# Patient Record
Sex: Female | Born: 1970 | Race: White | Hispanic: No | Marital: Married | Smoking: Never smoker
Health system: Southern US, Community
[De-identification: ages and names within clinical notes are randomized; demographics above are authoritative.]

## PROBLEM LIST (undated history)

## (undated) DIAGNOSIS — I10 Essential (primary) hypertension: Secondary | ICD-10-CM

## (undated) DIAGNOSIS — F419 Anxiety disorder, unspecified: Secondary | ICD-10-CM

## (undated) DIAGNOSIS — J13 Pneumonia due to Streptococcus pneumoniae: Secondary | ICD-10-CM

## (undated) DIAGNOSIS — J189 Pneumonia, unspecified organism: Secondary | ICD-10-CM

## (undated) DIAGNOSIS — G8929 Other chronic pain: Secondary | ICD-10-CM

## (undated) DIAGNOSIS — K219 Gastro-esophageal reflux disease without esophagitis: Secondary | ICD-10-CM

## (undated) DIAGNOSIS — D649 Anemia, unspecified: Secondary | ICD-10-CM

## (undated) DIAGNOSIS — M1990 Unspecified osteoarthritis, unspecified site: Secondary | ICD-10-CM

## (undated) DIAGNOSIS — G4700 Insomnia, unspecified: Secondary | ICD-10-CM

## (undated) DIAGNOSIS — E785 Hyperlipidemia, unspecified: Secondary | ICD-10-CM

## (undated) HISTORY — PX: CHEST TUBE INSERTION: SHX231

## (undated) HISTORY — DX: Essential (primary) hypertension: I10

## (undated) HISTORY — PX: GASTRIC BYPASS: SHX52

## (undated) HISTORY — DX: Pneumonia due to Streptococcus pneumoniae: J13

## (undated) HISTORY — PX: OTHER SURGICAL HISTORY: SHX169

## (undated) HISTORY — DX: Hyperlipidemia, unspecified: E78.5

---

## 1998-08-18 DIAGNOSIS — J13 Pneumonia due to Streptococcus pneumoniae: Secondary | ICD-10-CM

## 1998-08-18 HISTORY — DX: Pneumonia due to Streptococcus pneumoniae: J13

## 1998-08-18 HISTORY — PX: OTHER SURGICAL HISTORY: SHX169

## 2009-08-18 HISTORY — PX: OTHER SURGICAL HISTORY: SHX169

## 2009-08-24 LAB — PULMONARY FUNCTION TEST

## 2012-09-26 ENCOUNTER — Emergency Department (INDEPENDENT_AMBULATORY_CARE_PROVIDER_SITE_OTHER)
Admission: EM | Admit: 2012-09-26 | Discharge: 2012-09-26 | Disposition: A | Discharge status: Home / Self Care | Payer: 59 | Source: Home / Self Care | Attending: Family Medicine | Admitting: Family Medicine

## 2012-09-26 ENCOUNTER — Ambulatory Visit (HOSPITAL_BASED_OUTPATIENT_CLINIC_OR_DEPARTMENT_OTHER)
Admit: 2012-09-26 | Discharge: 2012-09-26 | Disposition: A | Discharge status: Home / Self Care | Payer: 59 | Source: Ambulatory Visit | Attending: Family Medicine | Admitting: Family Medicine

## 2012-09-26 DIAGNOSIS — R51 Headache: Secondary | ICD-10-CM | POA: Insufficient documentation

## 2012-09-26 DIAGNOSIS — R110 Nausea: Secondary | ICD-10-CM

## 2012-09-26 HISTORY — DX: Insomnia, unspecified: G47.00

## 2012-09-26 HISTORY — DX: Anxiety disorder, unspecified: F41.9

## 2012-09-26 HISTORY — DX: Gastro-esophageal reflux disease without esophagitis: K21.9

## 2012-09-26 LAB — POCT INFLUENZA A/B
Influenza A, POC: NEGATIVE
Influenza B, POC: NEGATIVE

## 2012-09-26 MED ORDER — DEXAMETHASONE SODIUM PHOSPHATE 10 MG/ML IJ SOLN
10.0000 mg | Freq: Once | INTRAMUSCULAR | Status: AC
  Administered 2012-09-26: 10 mg via INTRAMUSCULAR

## 2012-09-26 MED ORDER — PROMETHAZINE HCL 25 MG PO TABS: 25.0000 mg | ORAL_TABLET | Freq: Four times a day (QID) | ORAL | Status: DC | PRN

## 2012-09-26 MED ORDER — SUMATRIPTAN SUCCINATE 25 MG PO TABS: 25.0000 mg | ORAL_TABLET | ORAL | Status: DC | PRN

## 2012-09-26 MED ORDER — ONDANSETRON HCL 4 MG PO TABS
4.0000 mg | ORAL_TABLET | Freq: Once | ORAL | Status: AC
  Administered 2012-09-26: 4 mg via ORAL

## 2012-09-26 NOTE — ED Notes (Signed)
States she has had a migraine for 3 weeks without relief from pain meds, states she think it may be a sinus infection.

## 2012-09-26 NOTE — ED Provider Notes (Signed)
History     CSN: [id number]  Arrival date & time 09/26/12  1403   First MD Initiated Contact with Patient 09/26/12 1406      Chief Complaint  Patient presents with  . Migraine  . Nausea   HPI Patient presents today with headache and nausea. Symptoms have been present for the past 3 days. Patient states she has a remote history of migraines in the past however since he's had significant weight loss headache has not been present for the past 3 years. Patient states she's had mild frontal/occipital headache as been fairly persistent over the same time frame. Patient states the headache woke her up from sleep this morning and was the worse headache that she ever had before her life at the time. Patient is also has a mild blurry vision with this. As well as nausea no hemiparesis or confusion. No recent infections. No recent strenuous activity. Patient states she's been using Percocets from a recent kidney stone to help with pain as well as by mouth Phenergan. Patient states that these medications have been minimally effective. Patient is worried that this may be coming from a sinus infection. Though, patient denies any sinus pressure, or nasal congestion. Patient does have some mild body aches and chills. Noted positive flu exposure and sun. Patient has not had a flu shot this year.  Past Medical History  Diagnosis Date  . Anxiety   . GERD (gastroesophageal reflux disease)   . Insomnia     Past Surgical History  Procedure Laterality Date  . Cesarean section      x4  . Chest tube insertion    . Gastric bypass      No family history on file.  History  Substance Use Topics  . Smoking status: Not on file  . Smokeless tobacco: Not on file  . Alcohol Use: Not on file    OB History   No data available      Review of Systems  All other systems reviewed and are negative.    Allergies  Levaquin and Penicillins  Home Medications   Current Outpatient Rx  Name  Route  Sig   Dispense  Refill  . ALPRAZolam (XANAX) 1 MG tablet   Oral   Take 1 mg by mouth at bedtime as needed for sleep.         [name] levocetirizine (XYZAL) 5 MG tablet   Oral   Take 5 mg by mouth every evening.         [name] omeprazole (PRILOSEC) 40 MG capsule   Oral   Take 40 mg by mouth daily.         [name] oxyCODONE-acetaminophen (PERCOCET/ROXICET) 5-325 MG per tablet   Oral   Take 1 tablet by mouth every 4 (four) hours as needed for pain.         . traZODone (DESYREL) 100 MG tablet   Oral   Take 100 mg by mouth at bedtime.         [name] venlafaxine (EFFEXOR) 37.5 MG tablet   Oral   Take 37.5 mg by mouth 2 (two) times daily.         . Vitamin D, Ergocalciferol, (DRISDOL) 50000 UNITS CAPS   Oral   Take 50,000 Units by mouth.         . promethazine (PHENERGAN) 25 MG tablet   Oral   Take 1 tablet (25 mg total) by mouth every 6 (six) hours as needed for nausea.   30  tablet   0     BP 139/87  Pulse 73  Temp(Src) 98.4 F (36.9 C) (Oral)  Ht 5\' 5"  (1.651 m)  Wt 180 lb 4 oz (81.761 kg)  BMI 30 kg/m2  SpO2 99%  Physical Exam  Constitutional: She appears well-developed and well-nourished.  HENT:  Head: Normocephalic and atraumatic.  Right Ear: External ear normal.  Left Ear: External ear normal.  +nasal erythema, rhinorrhea bilaterally, + post oropharyngeal erythema    Eyes: Conjunctivae are normal. Pupils are equal, round, and reactive to light.  Mild photophobia on funduscopic exam   Neck: Normal range of motion. Neck supple.    Cardiovascular: Normal rate, regular rhythm and normal heart sounds.   Pulmonary/Chest: Effort normal.  Abdominal: Soft.  Musculoskeletal: Normal range of motion.  Lymphadenopathy:    She has no cervical adenopathy.  Neurological: She is alert. No cranial nerve deficit. Coordination normal.  Skin: Skin is warm.    ED Course  Procedures (including critical care time)  Labs Reviewed - No data to display No results found.   1.  Headache       MDM  We'll clinically treat with Decadron 10 mg IM x1 as well as by mouth zofran. Rx for po phenergan and imitrex given. Discussed withhold trazodone with use of imitrex given overlapping serotonergic effects. effexor is at fairly low dose. Discouraged Percocet used for this pain. Patient does meet some red flags for neuroimaging including this being the worst headache that she's had before her life and headache waking her up from sleep. Will obtain head CT without contrast to further evaluate neuroanatomy. Patient does not have any maxillary tenderness on my exam. Doubt there is any sinusitis overlap with symptoms. Discuss neuro an infectious red flags with patient at length. Follow up with PCP about this issue in next 2-3 days.     The patient and/or caregiver has been counseled thoroughly with regard to treatment plan and/or medications prescribed including dosage, schedule, interactions, rationale for use, and possible side effects and they verbalize understanding. Diagnoses and expected course of recovery discussed and will return if not improved as expected or if the condition worsens. Patient and/or caregiver verbalized understanding.              Doree Albee, MD 09/26/12 1544

## 2012-09-28 ENCOUNTER — Telehealth: Payer: Self-pay | Admitting: *Deleted

## 2012-12-11 ENCOUNTER — Emergency Department (INDEPENDENT_AMBULATORY_CARE_PROVIDER_SITE_OTHER): Payer: 59

## 2012-12-11 ENCOUNTER — Emergency Department (INDEPENDENT_AMBULATORY_CARE_PROVIDER_SITE_OTHER)
Admission: EM | Admit: 2012-12-11 | Discharge: 2012-12-11 | Disposition: A | Discharge status: Home / Self Care | Payer: 59 | Source: Home / Self Care | Attending: Family Medicine | Admitting: Family Medicine

## 2012-12-11 ENCOUNTER — Encounter: Payer: Self-pay | Admitting: Emergency Medicine

## 2012-12-11 DIAGNOSIS — R071 Chest pain on breathing: Secondary | ICD-10-CM

## 2012-12-11 DIAGNOSIS — R05 Cough: Secondary | ICD-10-CM

## 2012-12-11 DIAGNOSIS — J189 Pneumonia, unspecified organism: Secondary | ICD-10-CM

## 2012-12-11 DIAGNOSIS — D649 Anemia, unspecified: Secondary | ICD-10-CM

## 2012-12-11 DIAGNOSIS — G4700 Insomnia, unspecified: Secondary | ICD-10-CM

## 2012-12-11 DIAGNOSIS — K219 Gastro-esophageal reflux disease without esophagitis: Secondary | ICD-10-CM

## 2012-12-11 DIAGNOSIS — R918 Other nonspecific abnormal finding of lung field: Secondary | ICD-10-CM

## 2012-12-11 DIAGNOSIS — R059 Cough, unspecified: Secondary | ICD-10-CM

## 2012-12-11 HISTORY — DX: Pneumonia, unspecified organism: J18.9

## 2012-12-11 LAB — POCT CBC W AUTO DIFF (K'VILLE URGENT CARE)

## 2012-12-11 MED ORDER — ESZOPICLONE 1 MG PO TABS: 1.0000 mg | ORAL_TABLET | Freq: Every day | ORAL | Status: DC

## 2012-12-11 MED ORDER — ALPRAZOLAM 0.5 MG PO TABS: ORAL_TABLET | ORAL | Status: DC

## 2012-12-11 MED ORDER — CLARITHROMYCIN 500 MG PO TABS: 250.0000 mg | ORAL_TABLET | Freq: Two times a day (BID) | ORAL | Status: DC

## 2012-12-11 NOTE — ED Provider Notes (Signed)
History     CSN: [id number]  Arrival date & time 12/11/12  1314   First MD Initiated Contact with Patient 12/11/12 1334      Chief Complaint  Patient presents with  . Cough      HPI Comments: Patient has a history of GERD and aspiration pneumonia in the past.  She has run out of her Pepcid AC.  Two days ago she was awakened with an episode of reflux and feels that she aspirated again.  She has had a recurrent cough and right anterior pleuritic pain.  She has had wheezing.  She has been fatigued and yesterday felt hot.  She has had pneumonia three times in the past.  She has had pneumococcal vaccine.  She has a history of gastric bypass.  The history is provided by the patient.    Past Medical History  Diagnosis Date  . Anxiety   . GERD (gastroesophageal reflux disease)   . Insomnia   . Pneumonia     Past Surgical History  Procedure Laterality Date  . Cesarean section      x4  . Chest tube insertion    . Gastric bypass      No family history on file.  History  Substance Use Topics  . Smoking status: Never Smoker   . Smokeless tobacco: Not on file  . Alcohol Use: Yes    OB History   Grav Para Term Preterm Abortions TAB SAB Ect Mult Living                  Review of Systems No sore throat + cough + right pleuritic pain + wheezing + nasal congestion ? post-nasal drainage No sinus pain/pressure No itchy/red eyes ? right earache No hemoptysis + SOB No fever/chills No nausea No vomiting No abdominal pain No diarrhea No urinary symptoms No skin rashes + fatigue No myalgias + headache    Allergies  Levaquin and Penicillins  Home Medications   Current Outpatient Rx  Name  Route  Sig  Dispense  Refill  . ALPRAZolam (XANAX) 0.5 MG tablet      Take one tab by mouth 2 or 3 times daily as needed for anxiety   20 tablet   0   . ALPRAZolam (XANAX) 1 MG tablet   Oral   Take 1 mg by mouth at bedtime as needed for sleep.         .  clarithromycin (BIAXIN) 500 MG tablet   Oral   Take 0.5 tablets (250 mg total) by mouth 2 (two) times daily.   20 tablet   0   . eszopiclone (LUNESTA) 1 MG TABS   Oral   Take 1 tablet (1 mg total) by mouth at bedtime. Take immediately before bedtime   14 tablet   0   . levocetirizine (XYZAL) 5 MG tablet   Oral   Take 5 mg by mouth every evening.         [name] omeprazole (PRILOSEC) 40 MG capsule   Oral   Take 40 mg by mouth daily.         [name] oxyCODONE-acetaminophen (PERCOCET/ROXICET) 5-325 MG per tablet   Oral   Take 1 tablet by mouth every 4 (four) hours as needed for pain.         . promethazine (PHENERGAN) 25 MG tablet   Oral   Take 1 tablet (25 mg total) by mouth every 6 (six) hours as needed for nausea.   30 tablet  0   . SUMAtriptan (IMITREX) 25 MG tablet   Oral   Take 1 tablet (25 mg total) by mouth every 2 (two) hours as needed for migraine.   10 tablet   0   . Vitamin D, Ergocalciferol, (DRISDOL) 50000 UNITS CAPS   Oral   Take 50,000 Units by mouth.           BP 120/77  Pulse 81  Temp(Src) 98.1 F (36.7 C) (Oral)  Ht 5\' 6"  (1.676 m)  Wt 177 lb (80.287 kg)  BMI 28.58 kg/m2  SpO2 99%  LMP 12/10/2012  Physical Exam Nursing notes and Vital Signs reviewed. Appearance:  Patient appears stated age, and in no acute distress Eyes:  Pupils are equal, round, and reactive to light and accomodation.  Extraocular movement is intact.  Conjunctivae are not inflamed  Ears:  Canals normal.  Tympanic membranes normal.  Nose:  Mildly congested turbinates.  No sinus tenderness.     Pharynx:  Normal Neck:  Supple.  Slightly tender shotty right posterior node  Lungs:  Clear to auscultation.  Breath sounds are equal.  Heart:  Regular rate and rhythm without murmurs, rubs, or gallops.  Abdomen:  Nontender without masses or hepatosplenomegaly.  Bowel sounds are present.  No CVA or flank tenderness.  Extremities:  No edema.  No calf tenderness Skin:  No rash present.    ED Course  Procedures  none  Labs Reviewed  POCT CBC W AUTO DIFF ([facility])  WBC 11.1; LY 17.7; MO 4.6; GR 77.7; Hgb 11.3; Platelets 160    Dg Chest 2 View  12/11/2012  *RADIOLOGY REPORT*  Clinical Data: Cough for the past 2 days.  Pleuritic right-sided chest pain.  CHEST - 2 VIEW  Comparison: No priors.  Findings: Right upper lobe airspace consolidation concerning for pneumonia.  No definite pleural effusions.  Pulmonary vasculature and the cardiomediastinal silhouette are within normal.  IMPRESSION: 1.  Right upper lobe airspace consolidation concerning for pneumonia.  Repeat chest radiograph in 2-3 weeks after appropriate trial of antimicrobial therapy is highly recommended to ensure resolution of this finding (i.e., to exclude the possibility of a central obstructing neoplasm).   Original Report Authenticated By: Trudie Reed, M.D.      1. Right upper lobe pneumonia   2. Anemia   3. GERD (gastroesophageal reflux disease)   4. Insomnia       MDM  Begin Biaxin. Will substitute Lunesta for trazodone (because of clarithromycin interaction).  Will also decrease dose of Xanax for now. Take plain Mucinex (guaifenesin) twice daily for cough and congestion.  Increase fluid intake, rest. Stop all antihistamines for now, and other non-prescription cough/cold preparations. Stop trazodone Resume omeprazole for GERD If symptoms become significantly worse during the night or over the weekend, proceed to the local emergency room.  Followup with Family Doctor in one week.  Will need repeat chest X-ray in about two weeks        Lattie Haw, MD 12/11/12 1705

## 2012-12-11 NOTE — ED Notes (Signed)
Patient states had acid reflux episode 2 days ago and feels she may have aspirated some stomach acid (which has happened in past) and now has painful cough.

## 2012-12-12 ENCOUNTER — Telehealth: Payer: Self-pay | Admitting: Emergency Medicine

## 2012-12-17 ENCOUNTER — Ambulatory Visit (INDEPENDENT_AMBULATORY_CARE_PROVIDER_SITE_OTHER): Payer: 59 | Admitting: Family Medicine

## 2012-12-17 ENCOUNTER — Encounter: Payer: Self-pay | Admitting: Family Medicine

## 2012-12-17 ENCOUNTER — Ambulatory Visit (INDEPENDENT_AMBULATORY_CARE_PROVIDER_SITE_OTHER): Payer: 59

## 2012-12-17 VITALS — BP 148/96 | HR 69 | Temp 98.3°F | Wt 177.0 lb

## 2012-12-17 DIAGNOSIS — F341 Dysthymic disorder: Secondary | ICD-10-CM

## 2012-12-17 DIAGNOSIS — J690 Pneumonitis due to inhalation of food and vomit: Secondary | ICD-10-CM

## 2012-12-17 DIAGNOSIS — R5383 Other fatigue: Secondary | ICD-10-CM

## 2012-12-17 DIAGNOSIS — F329 Major depressive disorder, single episode, unspecified: Secondary | ICD-10-CM

## 2012-12-17 DIAGNOSIS — F419 Anxiety disorder, unspecified: Secondary | ICD-10-CM

## 2012-12-17 DIAGNOSIS — J189 Pneumonia, unspecified organism: Secondary | ICD-10-CM

## 2012-12-17 MED ORDER — VENLAFAXINE HCL ER 37.5 MG PO CP24: 37.5000 mg | ORAL_CAPSULE | Freq: Every day | ORAL | Status: DC
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
## 2012-12-18 LAB — VITAMIN B12: Vitamin B-12: 405 pg/mL (ref 211–911)

## 2012-12-24 ENCOUNTER — Encounter: Payer: Self-pay | Admitting: Family Medicine

## 2012-12-24 DIAGNOSIS — E559 Vitamin D deficiency, unspecified: Secondary | ICD-10-CM | POA: Insufficient documentation

## 2012-12-24 DIAGNOSIS — J189 Pneumonia, unspecified organism: Secondary | ICD-10-CM | POA: Insufficient documentation

## 2012-12-24 DIAGNOSIS — K219 Gastro-esophageal reflux disease without esophagitis: Secondary | ICD-10-CM

## 2012-12-24 DIAGNOSIS — E119 Type 2 diabetes mellitus without complications: Secondary | ICD-10-CM | POA: Insufficient documentation

## 2012-12-24 DIAGNOSIS — G43909 Migraine, unspecified, not intractable, without status migrainosus: Secondary | ICD-10-CM | POA: Insufficient documentation

## 2012-12-24 DIAGNOSIS — Z9884 Bariatric surgery status: Secondary | ICD-10-CM | POA: Insufficient documentation

## 2012-12-24 DIAGNOSIS — J329 Chronic sinusitis, unspecified: Secondary | ICD-10-CM | POA: Insufficient documentation

## 2012-12-24 DIAGNOSIS — G4700 Insomnia, unspecified: Secondary | ICD-10-CM

## 2012-12-24 DIAGNOSIS — E785 Hyperlipidemia, unspecified: Secondary | ICD-10-CM | POA: Insufficient documentation

## 2012-12-24 DIAGNOSIS — I10 Essential (primary) hypertension: Secondary | ICD-10-CM | POA: Insufficient documentation

## 2012-12-24 DIAGNOSIS — F32A Depression, unspecified: Secondary | ICD-10-CM | POA: Insufficient documentation

## 2012-12-24 DIAGNOSIS — E11319 Type 2 diabetes mellitus with unspecified diabetic retinopathy without macular edema: Secondary | ICD-10-CM | POA: Insufficient documentation

## 2012-12-24 DIAGNOSIS — F419 Anxiety disorder, unspecified: Secondary | ICD-10-CM | POA: Insufficient documentation

## 2012-12-24 DIAGNOSIS — E349 Endocrine disorder, unspecified: Secondary | ICD-10-CM

## 2012-12-24 DIAGNOSIS — F329 Major depressive disorder, single episode, unspecified: Secondary | ICD-10-CM | POA: Insufficient documentation

## 2013-01-18 ENCOUNTER — Telehealth: Payer: Self-pay | Admitting: Family Medicine

## 2013-01-18 ENCOUNTER — Ambulatory Visit (INDEPENDENT_AMBULATORY_CARE_PROVIDER_SITE_OTHER): Payer: 59 | Admitting: Family Medicine

## 2013-01-18 VITALS — BP 146/89 | HR 68 | Wt 180.0 lb

## 2013-01-18 DIAGNOSIS — I10 Essential (primary) hypertension: Secondary | ICD-10-CM

## 2013-01-18 MED ORDER — HYDROCHLOROTHIAZIDE 25 MG PO TABS: 25.0000 mg | ORAL_TABLET | Freq: Every day | ORAL | Status: DC

## 2013-01-18 NOTE — Progress Notes (Signed)
CC: Kimberly Wade is a 42 y.o. female is here for Hypertension   Subjective: HPI:  Patient presents with concerns of hypertension. She has been on an angiotensin receptor blocker prior to gastric bypass. She has been off of this for over a year. She has noticed at home blood pressures ranging systolics 140-170. She was experiencing tingling in all fingers and toes with waves of nausea and a headache memorial day weekend which resolved with increased hydration. This returned late last week she was seen at the emergency room in [place] and reportedly had normal labs, EKG, chest x-ray but did have a systolic blood pressure 160.  She's unsure about sodium intake but believes it's relatively low.  Nothing particularly makes blood pressure better or worse nor above symptoms. She currently denies nausea, headaches, motor sensory disturbances, chest pain, shortness of breath, orthopnea. She does note that she feels there is some mild edema in all of her fingers and toes but has been present for about a week. Present all hours of the day and symmetric.  Review Of Systems Outlined In HPI  Past Medical History  Diagnosis Date  . Anxiety   . GERD (gastroesophageal reflux disease)   . Insomnia   . Pneumonia   . Hypertension   . Diabetes   . Hyperlipidemia      Family History  Problem Relation Age of Onset  . Colon cancer      grandmother  . Heart attack      grandfather  . Diabetes Mother     father  . Hyperlipidemia    . Hypertension Mother      History  Substance Use Topics  . Smoking status: Never Smoker   . Smokeless tobacco: Not on file  . Alcohol Use: Yes     Objective: Filed Vitals:   01/18/13 1311  BP: 146/89  Pulse: 68    General: Alert and Oriented, No Acute Distress HEENT: Pupils equal, round, reactive to light. Conjunctivae clear.  Moist mucous membranes Neuro: Cranial nerves II through XII grossly intact Lungs: Clear to auscultation bilaterally, no  wheezing/ronchi/rales.  Comfortable work of breathing. Good air movement. Cardiac: Regular rate and rhythm. Normal S1/S2.  No murmurs, rubs, nor gallops.   Extremities: No peripheral edema.  Strong peripheral pulses.  Mental Status: No depression, anxiety, nor agitation. Skin: Warm and dry.  Assessment & Plan: Kimberly Wade was seen today for hypertension.  Diagnoses and associated orders for this visit:  Essential hypertension, benign - hydrochlorothiazide (HYDRODIURIL) 25 MG tablet; Take 1 tablet (25 mg total) by mouth daily.    Essential hypertension: Chronic uncontrolled condition with deterioration. We discussed options including ACE inhibitor, ARB, or hydrochlorothiazide, she would prefer hydrochlorothiazide. Discussed diet and exercise interventions. Recheck blood pressure 2-4 weeks with follow or sooner symptoms do not improve. Awaiting records from ER, will obtain metabolic panel only if not obtained at that visit.  Return in about 4 weeks (around 02/15/2013).

## 2013-01-18 NOTE — Telephone Encounter (Signed)
Daughter of patient will call the day before requesting fasting lipid panel with complete metabolic panel to determine if there is truly a need to remain on Pravachol, no history of stroke or PVD nor coronary artery disease. Will consider Pravachol holiday following this blood draw.

## 2013-02-15 ENCOUNTER — Telehealth: Payer: Self-pay | Admitting: *Deleted

## 2013-02-15 ENCOUNTER — Other Ambulatory Visit: Payer: Self-pay | Admitting: *Deleted

## 2013-02-15 DIAGNOSIS — G4700 Insomnia, unspecified: Secondary | ICD-10-CM

## 2013-02-15 MED ORDER — TRAZODONE HCL 100 MG PO TABS: ORAL_TABLET | ORAL | Status: DC

## 2013-02-15 NOTE — Telephone Encounter (Signed)
Pt calls today asking for a refill of her trazadone.  [facility] on [address] please.

## 2013-03-28 ENCOUNTER — Telehealth: Payer: Self-pay | Admitting: Family Medicine

## 2013-03-28 ENCOUNTER — Ambulatory Visit (INDEPENDENT_AMBULATORY_CARE_PROVIDER_SITE_OTHER): Payer: 59

## 2013-03-28 ENCOUNTER — Other Ambulatory Visit: Payer: Self-pay | Admitting: Family Medicine

## 2013-03-28 ENCOUNTER — Ambulatory Visit (INDEPENDENT_AMBULATORY_CARE_PROVIDER_SITE_OTHER): Payer: 59 | Admitting: Family Medicine

## 2013-03-28 ENCOUNTER — Encounter: Payer: Self-pay | Admitting: Family Medicine

## 2013-03-28 VITALS — BP 143/93 | HR 89 | Temp 98.2°F | Wt 174.0 lb

## 2013-03-28 DIAGNOSIS — J189 Pneumonia, unspecified organism: Secondary | ICD-10-CM

## 2013-03-28 DIAGNOSIS — I10 Essential (primary) hypertension: Secondary | ICD-10-CM

## 2013-03-28 DIAGNOSIS — G4700 Insomnia, unspecified: Secondary | ICD-10-CM

## 2013-03-28 DIAGNOSIS — J690 Pneumonitis due to inhalation of food and vomit: Secondary | ICD-10-CM

## 2013-03-28 DIAGNOSIS — F411 Generalized anxiety disorder: Secondary | ICD-10-CM

## 2013-03-28 DIAGNOSIS — R918 Other nonspecific abnormal finding of lung field: Secondary | ICD-10-CM

## 2013-03-28 DIAGNOSIS — R0789 Other chest pain: Secondary | ICD-10-CM

## 2013-03-28 DIAGNOSIS — R079 Chest pain, unspecified: Secondary | ICD-10-CM

## 2013-03-28 DIAGNOSIS — R071 Chest pain on breathing: Secondary | ICD-10-CM

## 2013-03-28 MED ORDER — TRAZODONE HCL 100 MG PO TABS: ORAL_TABLET | ORAL | Status: DC

## 2013-03-28 MED ORDER — ALPRAZOLAM 1 MG PO TABS: 1.0000 mg | ORAL_TABLET | Freq: Three times a day (TID) | ORAL | Status: DC | PRN

## 2013-03-28 MED ORDER — CLINDAMYCIN HCL 300 MG PO CAPS: 300.0000 mg | ORAL_CAPSULE | Freq: Three times a day (TID) | ORAL | Status: DC

## 2013-03-28 MED ORDER — HYDROCHLOROTHIAZIDE 25 MG PO TABS: 25.0000 mg | ORAL_TABLET | Freq: Every day | ORAL | Status: DC

## 2013-03-28 MED ORDER — OXYCODONE-ACETAMINOPHEN 10-325 MG PO TABS: 1.0000 | ORAL_TABLET | Freq: Four times a day (QID) | ORAL | Status: DC | PRN

## 2013-03-28 NOTE — Telephone Encounter (Signed)
Sue Lush,  Will you please let Mrs. Kimberly Wade know that the radiologist who looked at her chest xray has recommended a ct scan of the chest to confirm that the shadow I was showing her is a pneumonia and not a tumor, I think this is a good idea since I'm surprised she wasn't coughing or having fevers.  I've placed an order for this, but I'd still like her to continue with the antibiotic prescribed today.

## 2013-03-28 NOTE — Progress Notes (Signed)
CC: Kimberly Wade is a 42 y.o. female is here for No chief complaint on file.   Subjective: HPI:  Patient complains of right-sided chest pain that has been present on a daily basis the last week it is worsening on a daily basis. It is present all hours of the day described as a sharp stabbing sensation in the right middle chest that radiates to the right shoulder blade it is present only with inspiration nothing else makes worse. It is improved with shallow breathing or lying on her right side. Is not related to dietary habits or movements other than above. It is barely improved with Tylenol no other interventions as of yet. She still has her gallbladder. She denies nausea, vomiting, diarrhea, abdominal pain, constipation, fevers, chills, coughing, wheezing, shortness of breath, breast pain nor skin changes on the right chest.    Review Of Systems Outlined In HPI  Past Medical History  Diagnosis Date  . Anxiety   . GERD (gastroesophageal reflux disease)   . Insomnia   . Pneumonia   . Hypertension   . Diabetes   . Hyperlipidemia      Family History  Problem Relation Age of Onset  . Colon cancer      grandmother  . Heart attack      grandfather  . Diabetes Mother     father  . Hyperlipidemia    . Hypertension Mother      History  Substance Use Topics  . Smoking status: Never Smoker   . Smokeless tobacco: Not on file  . Alcohol Use: Yes     Objective: Filed Vitals:   03/28/13 1152  BP: 143/93  Pulse: 89  Temp: 98.2 F (36.8 C)    General: Alert and Oriented, No Acute Distress HEENT: Pupils equal, round, reactive to light. Conjunctivae clear.  Moist mucous membranes pharynx unremarkable Lungs: Clear to auscultation bilaterally, no wheezing/ronchi/rales.  Comfortable work of breathing. Good air movement. Cardiac: Regular rate and rhythm. Normal S1/S2.  No murmurs, rubs, nor gallops.   Abdomen: Mild obesity soft nontender MSK: Pain is not reproducible with  compression or palpation of rib cage Extremities: No peripheral edema.  Strong peripheral pulses.  Mental Status: No depression, anxiety, nor agitation. Skin: Warm and dry.  Assessment & Plan: Kimberly Wade was seen today for no specified reason.  Diagnoses and associated orders for this visit:  Right-sided chest wall pain - COMPLETE METABOLIC PANEL WITH GFR  Chest pain - DG Chest 2 View; Future - CBC w/Diff - COMPLETE METABOLIC PANEL WITH GFR  Pneumonia - clindamycin (CLEOCIN) 300 MG capsule; Take 1 capsule (300 mg total) by mouth 3 (three) times daily. - Ambulatory referral to Pulmonology  Aspiration pneumonia  Insomnia - traZODone (DESYREL) 100 MG tablet; 1-2 tabs at bedtime only as needed.  Essential hypertension, benign - hydrochlorothiazide (HYDRODIURIL) 25 MG tablet; Take 1 tablet (25 mg total) by mouth daily.  Generalized anxiety disorder - ALPRAZolam (XANAX) 1 MG tablet; Take 1 tablet (1 mg total) by mouth 3 (three) times daily as needed for sleep.  Other Orders - oxyCODONE-acetaminophen (PERCOCET) 10-325 MG per tablet; Take 1 tablet by mouth every 6 (six) hours as needed for pain.    Chest x-ray was obtained showing a mass in the right upper lung lobe I'm suspicious for aspiration pneumonia therefore we will start clindamycin, would like to check for white count and also screen for hepatic or gallbladder pathology which could also be contributing to her pain. Percocet for pain  I will relay the radiologist recommendation on CT scan with contrast for better characterization of the mass, order has been placed. I would also like to refer her to pulmonology since she has had for aspiration pneumonias in the last year Refills provided as outlined above per her request   Return in about 3 days (around 03/31/2013), or if symptoms worsen or fail to improve.

## 2013-03-28 NOTE — Telephone Encounter (Signed)
Pt.notified

## 2013-03-29 ENCOUNTER — Telehealth: Payer: Self-pay | Admitting: *Deleted

## 2013-03-29 LAB — CBC WITH DIFFERENTIAL/PLATELET
Eosinophils Absolute: 0.1 10*3/uL (ref 0.0–0.7)
Eosinophils Relative: 1 % (ref 0–5)
HCT: 33.2 % — ABNORMAL LOW (ref 36.0–46.0)
Lymphocytes Relative: 17 % (ref 12–46)
Lymphs Abs: 1.5 10*3/uL (ref 0.7–4.0)
MCH: 28.9 pg (ref 26.0–34.0)
MCV: 84.9 fL (ref 78.0–100.0)
Monocytes Absolute: 0.7 10*3/uL (ref 0.1–1.0)
RBC: 3.91 MIL/uL (ref 3.87–5.11)
RDW: 15 % (ref 11.5–15.5)
WBC: 9 10*3/uL (ref 4.0–10.5)

## 2013-03-29 LAB — COMPLETE METABOLIC PANEL WITH GFR
ALT: 11 U/L (ref 0–35)
AST: 11 U/L (ref 0–37)
Albumin: 3.6 g/dL (ref 3.5–5.2)
Alkaline Phosphatase: 75 U/L (ref 39–117)
BUN: 9 mg/dL (ref 6–23)
Calcium: 8.3 mg/dL — ABNORMAL LOW (ref 8.4–10.5)
Chloride: 106 mEq/L (ref 96–112)
Potassium: 3.5 mEq/L (ref 3.5–5.3)

## 2013-03-29 NOTE — Telephone Encounter (Signed)
Authorization obtained through [facility] for CT Chest w/ contrast.  Auth # [id number]  Good until 05/13/2013.  Bonita Quin in Imaging was notified. Barry Dienes, LPN

## 2013-03-30 ENCOUNTER — Encounter: Payer: Self-pay | Admitting: Family Medicine

## 2013-03-30 ENCOUNTER — Encounter (HOSPITAL_BASED_OUTPATIENT_CLINIC_OR_DEPARTMENT_OTHER): Payer: Self-pay

## 2013-03-30 ENCOUNTER — Ambulatory Visit (HOSPITAL_BASED_OUTPATIENT_CLINIC_OR_DEPARTMENT_OTHER)
Admission: RE | Admit: 2013-03-30 | Discharge: 2013-03-30 | Disposition: A | Discharge status: Home / Self Care | Payer: 59 | Source: Ambulatory Visit | Attending: Family Medicine | Admitting: Family Medicine

## 2013-03-30 ENCOUNTER — Other Ambulatory Visit (HOSPITAL_BASED_OUTPATIENT_CLINIC_OR_DEPARTMENT_OTHER): Payer: 59

## 2013-03-30 DIAGNOSIS — R222 Localized swelling, mass and lump, trunk: Secondary | ICD-10-CM | POA: Insufficient documentation

## 2013-03-30 DIAGNOSIS — R918 Other nonspecific abnormal finding of lung field: Secondary | ICD-10-CM

## 2013-03-30 MED ORDER — IOHEXOL 300 MG/ML  SOLN
80.0000 mL | Freq: Once | INTRAMUSCULAR | Status: AC | PRN
  Administered 2013-03-30: 80 mL via INTRAVENOUS

## 2013-03-31 ENCOUNTER — Encounter: Payer: Self-pay | Admitting: *Deleted

## 2013-04-05 ENCOUNTER — Telehealth: Payer: Self-pay | Admitting: Family Medicine

## 2013-04-05 ENCOUNTER — Emergency Department (HOSPITAL_BASED_OUTPATIENT_CLINIC_OR_DEPARTMENT_OTHER): Payer: 59

## 2013-04-05 ENCOUNTER — Encounter (HOSPITAL_BASED_OUTPATIENT_CLINIC_OR_DEPARTMENT_OTHER): Payer: Self-pay | Admitting: *Deleted

## 2013-04-05 ENCOUNTER — Emergency Department (HOSPITAL_BASED_OUTPATIENT_CLINIC_OR_DEPARTMENT_OTHER)
Admission: EM | Admit: 2013-04-05 | Discharge: 2013-04-05 | Disposition: A | Discharge status: Home / Self Care | Payer: 59 | Attending: Emergency Medicine | Admitting: Emergency Medicine

## 2013-04-05 DIAGNOSIS — I10 Essential (primary) hypertension: Secondary | ICD-10-CM | POA: Insufficient documentation

## 2013-04-05 DIAGNOSIS — E119 Type 2 diabetes mellitus without complications: Secondary | ICD-10-CM | POA: Insufficient documentation

## 2013-04-05 DIAGNOSIS — Z9889 Other specified postprocedural states: Secondary | ICD-10-CM | POA: Insufficient documentation

## 2013-04-05 DIAGNOSIS — R0602 Shortness of breath: Secondary | ICD-10-CM | POA: Insufficient documentation

## 2013-04-05 DIAGNOSIS — R059 Cough, unspecified: Secondary | ICD-10-CM | POA: Insufficient documentation

## 2013-04-05 DIAGNOSIS — J189 Pneumonia, unspecified organism: Secondary | ICD-10-CM

## 2013-04-05 DIAGNOSIS — Z880 Allergy status to penicillin: Secondary | ICD-10-CM | POA: Insufficient documentation

## 2013-04-05 DIAGNOSIS — F411 Generalized anxiety disorder: Secondary | ICD-10-CM | POA: Insufficient documentation

## 2013-04-05 DIAGNOSIS — K219 Gastro-esophageal reflux disease without esophagitis: Secondary | ICD-10-CM | POA: Insufficient documentation

## 2013-04-05 DIAGNOSIS — G4700 Insomnia, unspecified: Secondary | ICD-10-CM | POA: Insufficient documentation

## 2013-04-05 DIAGNOSIS — R079 Chest pain, unspecified: Secondary | ICD-10-CM | POA: Insufficient documentation

## 2013-04-05 DIAGNOSIS — R5381 Other malaise: Secondary | ICD-10-CM | POA: Insufficient documentation

## 2013-04-05 DIAGNOSIS — E785 Hyperlipidemia, unspecified: Secondary | ICD-10-CM | POA: Insufficient documentation

## 2013-04-05 DIAGNOSIS — Z9884 Bariatric surgery status: Secondary | ICD-10-CM | POA: Insufficient documentation

## 2013-04-05 DIAGNOSIS — Z79899 Other long term (current) drug therapy: Secondary | ICD-10-CM | POA: Insufficient documentation

## 2013-04-05 DIAGNOSIS — R05 Cough: Secondary | ICD-10-CM | POA: Insufficient documentation

## 2013-04-05 LAB — CBC WITH DIFFERENTIAL/PLATELET
Eosinophils Absolute: 0.2 10*3/uL (ref 0.0–0.7)
Eosinophils Relative: 3 % (ref 0–5)
HCT: 34.1 % — ABNORMAL LOW (ref 36.0–46.0)
Lymphocytes Relative: 23 % (ref 12–46)
Lymphs Abs: 1.3 10*3/uL (ref 0.7–4.0)
MCH: 29.4 pg (ref 26.0–34.0)
MCV: 88.6 fL (ref 78.0–100.0)
Monocytes Absolute: 0.6 10*3/uL (ref 0.1–1.0)
Platelets: 212 10*3/uL (ref 150–400)
RBC: 3.85 MIL/uL — ABNORMAL LOW (ref 3.87–5.11)

## 2013-04-05 LAB — BASIC METABOLIC PANEL
BUN: 13 mg/dL (ref 6–23)
CO2: 24 mEq/L (ref 19–32)
Calcium: 8.5 mg/dL (ref 8.4–10.5)
Creatinine, Ser: 0.6 mg/dL (ref 0.50–1.10)
GFR calc non Af Amer: >90 mL/min (ref >90)
Glucose, Bld: 99 mg/dL (ref 70–99)
Sodium: 137 mEq/L (ref 135–145)

## 2013-04-05 MED ORDER — KETOROLAC TROMETHAMINE 30 MG/ML IJ SOLN
30.0000 mg | Freq: Once | INTRAMUSCULAR | Status: AC
  Administered 2013-04-05: 30 mg via INTRAVENOUS
  Filled 2013-04-05: qty 1

## 2013-04-05 MED ORDER — MORPHINE SULFATE 4 MG/ML IJ SOLN: 4.0000 mg | INTRAMUSCULAR | Status: DC | PRN

## 2013-04-05 MED ORDER — ONDANSETRON HCL 4 MG/2ML IJ SOLN
4.0000 mg | Freq: Once | INTRAMUSCULAR | Status: AC
  Administered 2013-04-05: 4 mg via INTRAVENOUS
  Filled 2013-04-05: qty 2

## 2013-04-05 NOTE — ED Provider Notes (Signed)
CSN: [id number]     Arrival date & time 04/05/13  1125 History     First MD Initiated Contact with Patient 04/05/13 1156     Chief Complaint  Patient presents with  . Nausea    HPI   Patient reports a history of a decortication of a recurrent empyema from a parapneumonic streptococcal pneumonia several years ago. She's had a few episodes of aspiration pneumonia since that time. On Tuesday one week ago she developed some symptoms with back pain and shortness of breath. Primary care physician ordered a chest x-ray. He told her that it looked like she had an aspiration pneumonia again. He requested a CT scan. This was performed last Thursday.  She has done fairly well. She is back to work. She developed some pleuritic right-sided chest pain and episode of chills at work. She feels weak she presents here. Past Medical History  Diagnosis Date  . Anxiety   . GERD (gastroesophageal reflux disease)   . Insomnia   . Pneumonia   . Hypertension   . Diabetes   . Hyperlipidemia    Past Surgical History  Procedure Laterality Date  . Cesarean section      x4  . Chest tube insertion    . Gastric bypass    . Dysplastic mole removed      right ankle  . Chest tubes  2000  . Recoonstruction of right lung    . Mini gastric bypass  2011   Family History  Problem Relation Age of Onset  . Colon cancer      grandmother  . Heart attack      grandfather  . Diabetes Mother     father  . Hyperlipidemia    . Hypertension Mother    History  Substance Use Topics  . Smoking status: Never Smoker   . Smokeless tobacco: Not on file  . Alcohol Use: Yes   OB History   Grav Para Term Preterm Abortions TAB SAB Ect Mult Living                 Review of Systems  Constitutional: Negative for fever, chills, diaphoresis, appetite change and fatigue.  HENT: Negative for sore throat, mouth sores and trouble swallowing.   Eyes: Negative for visual disturbance.  Respiratory: Positive for cough and  shortness of breath. Negative for chest tightness and wheezing.   Cardiovascular: Positive for chest pain.  Gastrointestinal: Negative for nausea, vomiting, abdominal pain, diarrhea and abdominal distention.  Endocrine: Negative for polydipsia, polyphagia and polyuria.  Genitourinary: Negative for dysuria, frequency and hematuria.  Musculoskeletal: Negative for gait problem.  Skin: Negative for color change, pallor and rash.  Neurological: Positive for weakness. Negative for dizziness, syncope, light-headedness and headaches.  Hematological: Does not bruise/bleed easily.  Psychiatric/Behavioral: Negative for behavioral problems and confusion.    Allergies  Levaquin and Penicillins  Home Medications   Current Outpatient Rx  Name  Route  Sig  Dispense  Refill  . ALPRAZolam (XANAX) 1 MG tablet   Oral   Take 1 tablet (1 mg total) by mouth 3 (three) times daily as needed for sleep.   90 tablet   1   . clindamycin (CLEOCIN) 300 MG capsule   Oral   Take 1 capsule (300 mg total) by mouth 3 (three) times daily.   30 capsule   0   . hydrochlorothiazide (HYDRODIURIL) 25 MG tablet   Oral   Take 1 tablet (25 mg total) by mouth daily.  30 tablet   3   . omeprazole (PRILOSEC) 40 MG capsule   Oral   Take 40 mg by mouth daily.         [name] oxyCODONE-acetaminophen (PERCOCET) 10-325 MG per tablet   Oral   Take 1 tablet by mouth every 6 (six) hours as needed for pain.   30 tablet   0   . promethazine (PHENERGAN) 25 MG tablet   Oral   Take 1 tablet (25 mg total) by mouth every 6 (six) hours as needed for nausea.   30 tablet   0   . SUMAtriptan (IMITREX) 25 MG tablet   Oral   Take 1 tablet (25 mg total) by mouth every 2 (two) hours as needed for migraine.   10 tablet   0   . traZODone (DESYREL) 100 MG tablet      1-2 tabs at bedtime only as needed.   60 tablet   5   . Vitamin D, Ergocalciferol, (DRISDOL) 50000 UNITS CAPS   Oral   Take 50,000 Units by mouth every 7 (seven)  days.           BP 133/75  Pulse 64  Temp(Src) 98.4 F (36.9 C) (Oral)  Resp 20  Ht 5\' 5"  (1.651 m)  Wt 172 lb (78.019 kg)  BMI 28.62 kg/m2  LMP 03/14/2013 Physical Exam  Constitutional: She is oriented to person, place, and time. She appears well-developed and well-nourished. No distress.  HENT:  Head: Normocephalic.  Eyes: Conjunctivae are normal. Pupils are equal, round, and reactive to light. No scleral icterus.  Neck: Normal range of motion. Neck supple. No thyromegaly present.  Cardiovascular: Normal rate and regular rhythm.  Exam reveals no gallop and no friction rub.   No murmur heard. Pulmonary/Chest: Effort normal and breath sounds normal. No respiratory distress. She has no wheezes. She has no rales.  No decreased breath sounds. No crackles. She is not tachypneic.  Abdominal: Soft. Bowel sounds are normal. She exhibits no distension. There is no tenderness. There is no rebound.  Musculoskeletal: Normal range of motion.  Neurological: She is alert and oriented to person, place, and time.  Skin: Skin is warm and dry. No rash noted.  Psychiatric: She has a normal mood and affect. Her behavior is normal.    ED Course   DG CHEST 2 VIEW   Final Result:        Procedures (including critical care time)  Labs Reviewed  CBC WITH DIFFERENTIAL - Abnormal; Notable for the following:    RBC 3.85 (*)    Hemoglobin 11.3 (*)    HCT 34.1 (*)    All other components within normal limits  BASIC METABOLIC PANEL   Dg Chest 2 View  04/05/2013   *RADIOLOGY REPORT*  Clinical Data: Pneumonia.  Nausea.  CHEST - 2 VIEW  Comparison: 03/30/2013 and 03/28/2013  Findings: Rounded area of infiltrate in the peripheral right upper lobe has decreased compared with previous studies.  This consistent with improving infectious or inflammatory process.  No other areas of infiltrate are seen.  No evidence of pleural effusion.  The heart size is normal.  No hilar or mediastinal masses identified.   IMPRESSION: Resolving focal airspace disease in the right upper lobe, consistent with improving infectious or inflammatory process. Recommend continued radiographic followup to confirm resolution.   Original Report Authenticated By: Myles Rosenthal, M.D.   1. Recurrent pneumonia     MDM  She is not hypoxemic. Her sodium and electrolytes  white count and hemoglobin all appear acceptable. This is a round appearing infiltrate. CT scan interpreted as without infiltrate. Recommendation regarding followup to resolution made. I agree with this. On her most recent episode in April and May of this year she did have followup x-rays which showed near resolution. I discussed with her pulmonary referral. She states her primary care physician is making arrangements for this. We talked about the possibility of bronchiectasis or a stricture related to her past infections.  With her most recent pneumonia she did have x-rays were followed until near-complete resolution. Again I recommended followup x-rays and followup with primary care and pulmonary until resolution to ensure that this is not mass.  Her diagnosis is right upper lobe round pneumonia recurrent  Claudean Kinds, MD 04/05/13 1300

## 2013-04-05 NOTE — Telephone Encounter (Signed)
Sue Lush, When you get a chance can you see if there is any progress on a date for a pulm appt for Kimberly Wade at [facility], looks like [facility] sent it to them on the 11th.  No word yet.

## 2013-04-05 NOTE — ED Notes (Addendum)
Patient states that she was diagnosed with pneumonia last week and now feels "shaky", and fells like her "insides are burning". Taking clindamycin 300mg . States that she is nauseous as well.

## 2013-04-06 ENCOUNTER — Telehealth: Payer: Self-pay | Admitting: *Deleted

## 2013-04-06 NOTE — Telephone Encounter (Signed)
Scheduled pulm appt for 04/14/2013 at 930 at [facility] location. Pt notified and aware of appt

## 2013-04-06 NOTE — Telephone Encounter (Signed)
Scheduled appt with [facility] for 8/28/14a @  930 am at the [facility] location. Pt notified of appt time and date and given phone num to office

## 2013-04-14 ENCOUNTER — Encounter: Payer: Self-pay | Admitting: *Deleted

## 2013-04-14 ENCOUNTER — Ambulatory Visit (INDEPENDENT_AMBULATORY_CARE_PROVIDER_SITE_OTHER): Payer: 59 | Admitting: Critical Care Medicine

## 2013-04-14 ENCOUNTER — Encounter: Payer: Self-pay | Admitting: Critical Care Medicine

## 2013-04-14 VITALS — BP 138/80 | HR 70 | Temp 99.1°F | Ht 65.0 in | Wt 178.0 lb

## 2013-04-14 DIAGNOSIS — R222 Localized swelling, mass and lump, trunk: Secondary | ICD-10-CM

## 2013-04-14 DIAGNOSIS — J189 Pneumonia, unspecified organism: Secondary | ICD-10-CM

## 2013-04-14 DIAGNOSIS — R918 Other nonspecific abnormal finding of lung field: Secondary | ICD-10-CM

## 2013-04-14 MED ORDER — OMEPRAZOLE 40 MG PO CPDR: 40.0000 mg | DELAYED_RELEASE_CAPSULE | Freq: Two times a day (BID) | ORAL | Status: DC

## 2013-04-14 MED ORDER — LEVOCETIRIZINE DIHYDROCHLORIDE 5 MG PO TABS: 5.0000 mg | ORAL_TABLET | Freq: Every evening | ORAL | Status: DC

## 2013-04-14 NOTE — Assessment & Plan Note (Signed)
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 I am concerned about potential for gastric emptying issues leading to reflux and aspiration. I doubt this represents malignancy. I am concerned about immunodeficiency in this patient. The patient's had repeated episodes of pneumonia and prior history of pneumococcal pneumonia with empyema on the right side. Need to evaluate airway anatomy in more detail.  Plan Increase omeprazole to twice daily before meals 1/2 hour Xyzal was refilled A bronchoscopy will be obtained on Sept 3rd at 8AM, at [facility], arrive at [facility], nothing by mouth after midnight No other medication changes Strict reflux diet Also obtain immunoglobulin G., A., M. levels and fungal assay is A gastroenterology consultation would be beneficial and will speak to primary care physician regarding this

## 2013-04-14 NOTE — Patient Instructions (Addendum)
Increase omeprazole to twice daily before meals 1/2 hour Xyzal was refilled A bronchoscopy will be obtained on Sept 3rd at 8AM, at [facility], arrive at [facility], nothing by mouth after midnight No other medication changes Strict reflux diet You need a GI evaluation, I will speak to Dr Katrine Coho today: immune deficiency panel, fungal panel Return 2 weeks

## 2013-04-14 NOTE — Progress Notes (Signed)
Subjective:    Patient ID: Kimberly Wade, female    DOB: 12-12-1970, 42 y.o.   MRN: [id number]  HPI 42 y.o.F This patient has had recurrent pneumonia the right lung for 1-1/2 years. The patient was first diagnosed with pneumococcal pneumonia in the year 2000.  At that time the patient had empyema on the right and required mechanical ventilation. The patient subsequently needed video-assisted thorascopic surgery with drainage. The patient did well until mid 2013 when the patient developed another bout of pneumonia the right lung. Subsequently to this 6 months later 2 more bouts of pneumonia occurred. The patient states that each episode is accompanied with a sensation of aspiration particularly at night. The patient does have high level reflux and has ongoing heartburn on a daily basis. The patient complains of right-sided chest pain and dry cough. There is no excess mucus production. There is no wheezing. There's no weight loss. There is no fever chills or sweats. This patient is a lifelong never smoker. The patient states that the last episode of pneumonia was accompanied by extreme fatigue and severe sharp pain in the back. The patient had severe dyspnea as well. Patient very hard time taking deep breath. Patient was placed on oral clindamycin with some resolution of symptoms. A CT scan of the chest was obtained and did show evidence of a rounded mass in the right upper lobe posterior segment with pleural base and areas of necrotizing [place]. The patient denies any real wheeze and denies any edema in the lower extremities. The patient does take omeprazole daily but takes this at bedtime after eating dinner This patient had a history gastric bypass but does not follow with gastroenterology an irregular basis at this time   Past Medical History  Diagnosis Date  . Anxiety   . GERD (gastroesophageal reflux disease)   . Insomnia   . Pneumonia   . Hypertension   . Diabetes   . Hyperlipidemia   .  Pneumococcal pneumonia 2000     Family History  Problem Relation Age of Onset  . Colon cancer      grandmother  . Heart attack      grandfather  . Diabetes Father     father  . Hyperlipidemia    . Hypertension Mother   . Asthma Daughter      History   Social History  . Marital Status: Legally Separated    Spouse Name: N/A    Number of Children: N/A  . Years of Education: N/A   Occupational History  . Advertising Accountant    Social History Main Topics  . Smoking status: Never Smoker   . Smokeless tobacco: Never Used  . Alcohol Use: Yes     Comment: 1 glass per month  . Drug Use: No  . Sexual Activity: Not on file   Other Topics Concern  . Not on file   Social History Narrative  . No narrative on file     Allergies  Allergen Reactions  . Levaquin [Levofloxacin In D5w] Hives  . Penicillins      Outpatient Prescriptions Prior to Visit  Medication Sig Dispense Refill  . ALPRAZolam (XANAX) 1 MG tablet Take 1 tablet (1 mg total) by mouth 3 (three) times daily as needed for sleep.  90 tablet  1  . hydrochlorothiazide (HYDRODIURIL) 25 MG tablet Take 1 tablet (25 mg total) by mouth daily.  30 tablet  3  . oxyCODONE-acetaminophen (PERCOCET) 10-325 MG per tablet Take 1 tablet by  mouth every 6 (six) hours as needed for pain.  30 tablet  0  . promethazine (PHENERGAN) 25 MG tablet Take 1 tablet (25 mg total) by mouth every 6 (six) hours as needed for nausea.  30 tablet  0  . SUMAtriptan (IMITREX) 25 MG tablet Take 1 tablet (25 mg total) by mouth every 2 (two) hours as needed for migraine.  10 tablet  0  . traZODone (DESYREL) 100 MG tablet 1-2 tabs at bedtime only as needed.  60 tablet  5  . Vitamin D, Ergocalciferol, (DRISDOL) 50000 UNITS CAPS Take 50,000 Units by mouth every 7 (seven) days.       [name] omeprazole (PRILOSEC) 40 MG capsule Take 40 mg by mouth daily.      . clindamycin (CLEOCIN) 300 MG capsule Take 1 capsule (300 mg total) by mouth 3 (three) times daily.  30  capsule  0   No facility-administered medications prior to visit.      Review of Systems  Constitutional: Positive for activity change and fatigue. Negative for fever, chills, diaphoresis, appetite change and unexpected weight change.  HENT: Positive for congestion, sore throat, sneezing, postnasal drip and sinus pressure. Negative for hearing loss, ear pain, nosebleeds, facial swelling, rhinorrhea, mouth sores, trouble swallowing, neck pain, neck stiffness, dental problem, voice change, tinnitus and ear discharge.   Eyes: Positive for discharge and itching. Negative for photophobia and visual disturbance.  Respiratory: Positive for cough and shortness of breath. Negative for apnea, choking, chest tightness, wheezing and stridor.   Cardiovascular: Positive for chest pain and leg swelling. Negative for palpitations.  Gastrointestinal: Positive for nausea. Negative for vomiting, abdominal pain, constipation, blood in stool and abdominal distention.  Genitourinary: Negative for dysuria, urgency, frequency, hematuria, flank pain, decreased urine volume and difficulty urinating.  Musculoskeletal: Positive for back pain. Negative for myalgias, joint swelling, arthralgias and gait problem.  Skin: Negative for color change, pallor and rash.  Neurological: Positive for headaches. Negative for dizziness, tremors, seizures, syncope, speech difficulty, weakness, light-headedness and numbness.  Hematological: Negative for adenopathy. Does not bruise/bleed easily.  Psychiatric/Behavioral: Positive for sleep disturbance. Negative for confusion and agitation. The patient is not nervous/anxious.        Objective:   Physical Exam  Filed Vitals:   04/14/13 0918  BP: 138/80  Pulse: 70  Temp: 99.1 F (37.3 C)  TempSrc: Oral  Height: 5\' 5"  (1.651 m)  Weight: 178 lb (80.74 kg)  SpO2: 99%    Gen: Pleasant, well-nourished, in no distress,  normal affect  ENT: No lesions,  mouth clear,  oropharynx  clear, no postnasal drip  Neck: No JVD, no TMG, no carotid bruits  Lungs: No use of accessory muscles, no dullness to percussion, clear without rales or rhonchi  Cardiovascular: RRR, heart sounds normal, no murmur or gallops, no peripheral edema  Abdomen: soft and NT, no HSM,  BS normal  Musculoskeletal: No deformities, no cyanosis or clubbing  Neuro: alert, non focal  Skin: Warm, no lesions or rashes   CT scan of the chest from 03/28/2013 shows there is a mass in the right upper lobe posterior segment and measures 4 x 3 x 2.9 cm in size. The margins are ill-defined. There is reticular and hazy groundglass opacification. There is areas of low attenuation that may represent necrosis. There are no other lung mass is seen. There is no significant lymphadenopathy or pleural fluid seen.  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 I am concerned about potential for gastric emptying issues leading to reflux and aspiration. I doubt this represents malignancy. I am concerned about immunodeficiency in this patient. The patient's had repeated episodes of pneumonia and prior history of pneumococcal pneumonia with empyema on the right side. Need to evaluate airway anatomy in more detail.  Plan Increase omeprazole to twice daily before meals 1/2 hour Xyzal was refilled A bronchoscopy will be obtained on Sept 3rd at 8AM, at [facility], arrive at [facility], nothing by mouth after midnight No other medication changes Strict reflux diet Also obtain immunoglobulin G., A., M. levels and fungal assay is A gastroenterology consultation would be beneficial and will speak to primary care physician regarding this   Updated  Medication List Outpatient Encounter Prescriptions as of 04/14/2013  Medication Sig Dispense Refill  . ALPRAZolam (XANAX) 1 MG tablet Take 1 tablet (1 mg total) by mouth 3 (three) times daily as needed for sleep.  90 tablet  1  . BIOTIN PO Take 1 capsule by mouth daily.      . hydrochlorothiazide (HYDRODIURIL) 25 MG tablet Take 1 tablet (25 mg total) by mouth daily.  30 tablet  3  . Multiple Vitamin (MULTIVITAMIN) tablet Take 1 tablet by mouth 3 (three) times daily.      [name] omeprazole (PRILOSEC) 40 MG capsule Take 1 capsule (40 mg total) by mouth 2 (two) times daily before a meal.  60 capsule  6  . oxyCODONE-acetaminophen (PERCOCET) 10-325 MG per tablet Take 1 tablet by mouth every 6 (six) hours as needed for pain.  30 tablet  0  . promethazine (PHENERGAN) 25 MG tablet Take 1 tablet (25 mg total) by mouth every 6 (six) hours as needed for nausea.  30 tablet  0  . SUMAtriptan (IMITREX) 25 MG tablet Take 1 tablet (25 mg total) by mouth every 2 (two) hours as needed for migraine.  10 tablet  0  . traZODone (DESYREL) 100 MG tablet 1-2 tabs at bedtime only as needed.  60 tablet  5  . Vitamin D, Ergocalciferol, (DRISDOL) 50000 UNITS CAPS Take 50,000 Units by mouth every 7 (seven) days.       . [DISCONTINUED] omeprazole (PRILOSEC) 40 MG capsule Take 40 mg by mouth daily.      [name] levocetirizine (XYZAL) 5 MG tablet Take 1 tablet (5 mg total) by mouth every evening.  30 tablet  6  . [DISCONTINUED] clindamycin (CLEOCIN) 300 MG capsule Take 1 capsule (300 mg total) by mouth 3 (three) times daily.  30 capsule  0   No facility-administered encounter medications on file as of 04/14/2013.

## 2013-04-19 ENCOUNTER — Encounter (HOSPITAL_COMMUNITY): Payer: Self-pay

## 2013-04-19 ENCOUNTER — Telehealth: Payer: Self-pay | Admitting: Critical Care Medicine

## 2013-04-19 LAB — FUNGAL ANTIBODIES PANEL, ID-BLOOD
Aspergillus Niger Antibodies: NEGATIVE
Aspergillus fumigatus: NEGATIVE
Blastomyces Abs, Qn, DID: NEGATIVE
Coccidioides Antibody ID: NEGATIVE
Histoplasma Antibody, ID: NEGATIVE

## 2013-04-20 ENCOUNTER — Encounter (HOSPITAL_COMMUNITY): Payer: Self-pay | Admitting: Respiratory Therapy

## 2013-04-20 ENCOUNTER — Ambulatory Visit (HOSPITAL_COMMUNITY)
Admission: RE | Admit: 2013-04-20 | Discharge: 2013-04-20 | Disposition: A | Discharge status: Home / Self Care | Payer: 59 | Source: Ambulatory Visit | Attending: Critical Care Medicine | Admitting: Critical Care Medicine

## 2013-04-20 ENCOUNTER — Ambulatory Visit (HOSPITAL_COMMUNITY): Payer: 59

## 2013-04-20 ENCOUNTER — Encounter (HOSPITAL_COMMUNITY)
Admission: RE | Disposition: A | Discharge status: Home / Self Care | Payer: 59 | Source: Ambulatory Visit | Attending: Critical Care Medicine

## 2013-04-20 DIAGNOSIS — R079 Chest pain, unspecified: Secondary | ICD-10-CM | POA: Insufficient documentation

## 2013-04-20 DIAGNOSIS — R059 Cough, unspecified: Secondary | ICD-10-CM | POA: Insufficient documentation

## 2013-04-20 DIAGNOSIS — K219 Gastro-esophageal reflux disease without esophagitis: Secondary | ICD-10-CM | POA: Insufficient documentation

## 2013-04-20 DIAGNOSIS — I10 Essential (primary) hypertension: Secondary | ICD-10-CM | POA: Insufficient documentation

## 2013-04-20 DIAGNOSIS — R05 Cough: Secondary | ICD-10-CM | POA: Insufficient documentation

## 2013-04-20 DIAGNOSIS — Z800 Family history of malignant neoplasm of digestive organs: Secondary | ICD-10-CM | POA: Insufficient documentation

## 2013-04-20 DIAGNOSIS — Z8701 Personal history of pneumonia (recurrent): Secondary | ICD-10-CM | POA: Insufficient documentation

## 2013-04-20 DIAGNOSIS — R918 Other nonspecific abnormal finding of lung field: Secondary | ICD-10-CM

## 2013-04-20 DIAGNOSIS — E119 Type 2 diabetes mellitus without complications: Secondary | ICD-10-CM | POA: Insufficient documentation

## 2013-04-20 DIAGNOSIS — R222 Localized swelling, mass and lump, trunk: Secondary | ICD-10-CM | POA: Insufficient documentation

## 2013-04-20 DIAGNOSIS — Z9884 Bariatric surgery status: Secondary | ICD-10-CM | POA: Insufficient documentation

## 2013-04-20 DIAGNOSIS — R12 Heartburn: Secondary | ICD-10-CM | POA: Insufficient documentation

## 2013-04-20 HISTORY — PX: VIDEO BRONCHOSCOPY: SHX5072

## 2013-04-20 SURGERY — BRONCHOSCOPY, WITH FLUOROSCOPY
Anesthesia: Moderate Sedation | Laterality: Bilateral

## 2013-04-20 MED ORDER — LIDOCAINE HCL 2 % EX GEL
Freq: Once | CUTANEOUS | Status: DC
  Filled 2013-04-20: qty 5

## 2013-04-20 MED ORDER — MIDAZOLAM BOLUS VIA INFUSION: 2.0000 mg | INTRAVENOUS | Status: DC | PRN

## 2013-04-20 MED ORDER — MIDAZOLAM HCL 2 MG/2ML IJ SOLN: 2.0000 mg | INTRAMUSCULAR | Status: DC | PRN

## 2013-04-20 MED ORDER — PHENYLEPHRINE HCL 0.25 % NA SOLN
NASAL | Status: DC | PRN
  Administered 2013-04-20: 2 via NASAL

## 2013-04-20 MED ORDER — LIDOCAINE HCL (PF) 1 % IJ SOLN
INTRAMUSCULAR | Status: DC | PRN
  Administered 2013-04-20: 6 mL

## 2013-04-20 MED ORDER — FENTANYL CITRATE 0.05 MG/ML IJ SOLN
INTRAMUSCULAR | Status: DC | PRN
  Administered 2013-04-20: 50 ug via INTRAVENOUS
  Administered 2013-04-20: 25 ug via INTRAVENOUS

## 2013-04-20 MED ORDER — PHENYLEPHRINE HCL 0.25 % NA SOLN
1.0000 | Freq: Four times a day (QID) | NASAL | Status: DC | PRN
  Filled 2013-04-20: qty 15

## 2013-04-20 MED ORDER — BUTAMBEN-TETRACAINE-BENZOCAINE 2-2-14 % EX AERO: 1.0000 | INHALATION_SPRAY | Freq: Once | CUTANEOUS | Status: DC

## 2013-04-20 MED ORDER — LIDOCAINE HCL 2 % EX GEL
CUTANEOUS | Status: DC | PRN
  Administered 2013-04-20: 1

## 2013-04-20 MED ORDER — SODIUM CHLORIDE 0.9 % IV SOLN
INTRAVENOUS | Status: DC
  Administered 2013-04-20: 08:00:00 via INTRAVENOUS

## 2013-04-20 MED ORDER — FENTANYL CITRATE 0.05 MG/ML IJ SOLN: 25.0000 ug | INTRAMUSCULAR | Status: DC | PRN

## 2013-04-20 MED ORDER — MIDAZOLAM HCL 10 MG/2ML IJ SOLN
INTRAMUSCULAR | Status: DC | PRN
  Administered 2013-04-20: 1 mg via INTRAVENOUS
  Administered 2013-04-20: 2 mg via INTRAVENOUS

## 2013-04-20 NOTE — Interval H&P Note (Signed)
The pt has no new symptoms and no interval change in H and P. The pt is ready for planned OR/FOB. Luisa Hart WrightMD

## 2013-04-20 NOTE — Telephone Encounter (Signed)
Notified melanie no precert required Tobe Sos

## 2013-04-20 NOTE — Op Note (Signed)
Bronchoscopy Procedure Note  Date of Operation: 04/20/2013  Pre-op Diagnosis: Lung mass  Post-op Diagnosis: resolved, pneumonia  Surgeon: Shan Levans  Anesthesia: Monitored Local Anesthesia with Sedation Versed 3mg  iv ; fentanyl iv  Operation: Flexible fiberoptic bronchoscopy, diagnostic   Findings: Normal exam  Specimen: Bronch washing  Estimated Blood Loss: none  Complications: none  Indications and History: The patient is a 42 y.o. female with hx of recurrent PNA.  The risks, benefits, complications, treatment options and expected outcomes were discussed with the patient.  The possibilities of reaction to medication, pulmonary aspiration, perforation of a viscus, bleeding, failure to diagnose a condition and creating a complication requiring transfusion or operation were discussed with the patient who freely signed the consent.    Description of Procedure: The patient was re-examined in the bronchoscopy suite and the site of surgery properly noted/marked.  The patient was identified as Kimberly Wade and the procedure verified as Flexible Fiberoptic Bronchoscopy.  A Time Out was held and the above information confirmed.   After the induction of topical nasopharyngeal anesthesia, the patient was positioned  and the bronchoscope was passed through the L nares. The vocal cords were visualized and  1% buffered lidocaine 5 ml was topically placed onto the cords. The cords were normal. The scope was then passed into the trachea.  1% buffered lidocaine 5 ml was used topically on the carina.  Careful inspection of the tracheal lumen was accomplished. The scope was sequentially passed into the left main and then left upper and lower bronchi and segmental bronchi.     The scope was then withdrawn and advanced into the right main bronchus and then into the RUL, RML, and RLL bronchi and segmental bronchi.   Bronchial washing RUL only  was done and there was one  specimen.    Endobronchial findings: Normal exam Trachea: Normal mucosa Carina: Normal mucosa Right main bronchus: Normal mucosa Right upper lobe bronchus: Normal mucosa Right middle lobe bronchus: Normal mucosa Right lower lobe bronchus: Normal mucosa Left main bronchus: Normal mucosa Left upper lobe bronchus: Normal mucosa Left lower lobe bronchus: Normal mucosa  The Patient was taken to the Endoscopy Recovery area in satisfactory condition.  Attestation: I performed the procedure.  Luisa Hart WrightMD

## 2013-04-20 NOTE — Discharge Instructions (Signed)
Flexible Bronchoscopy Care After These instructions give you information on caring for yourself after your procedure. Your doctor may also give you specific instructions. Call your doctor if you have any problems or questions after your procedure. HOME CARE  Do not eat or drink anything for 2 hours after your test.  After 2 hours have passed, eat soft food and drink liquids slowly.  The day after the test, go back to eating as normal.  Continue normal activities.  Keep all doctor visits if tissue samples (biopsies) were taken. Finding out the results of your test Ask when your test results will be ready. Make sure you get your test results. GET HELP RIGHT AWAY IF:  You get lightheaded.  You get short of breath.  You feel like you are going to pass out (faint).  You have chest pain.  You cough up blood. MAKE SURE YOU:  Understand these instructions.  Will watch your condition.  Will get help right away if you are not doing well or get worse. Document Released: 06/01/2009 Document Revised: 10/27/2011 Document Reviewed: 06/01/2009 [facility] Patient Information 2014 [place], [place].  Nothing to eat or drink until   10:30 am Today      04/20/2013

## 2013-04-20 NOTE — H&P (View-Only) (Signed)
Subjective:    Patient ID: Kimberly Wade, female    DOB: 03/06/1971, 42 y.o.   MRN: [id number]  HPI 42 y.o.F This patient has had recurrent pneumonia the right lung for 1-1/2 years. The patient was first diagnosed with pneumococcal pneumonia in the year 2000.  At that time the patient had empyema on the right and required mechanical ventilation. The patient subsequently needed video-assisted thorascopic surgery with drainage. The patient did well until mid 2013 when the patient developed another bout of pneumonia the right lung. Subsequently to this 6 months later 2 more bouts of pneumonia occurred. The patient states that each episode is accompanied with a sensation of aspiration particularly at night. The patient does have high level reflux and has ongoing heartburn on a daily basis. The patient complains of right-sided chest pain and dry cough. There is no excess mucus production. There is no wheezing. There's no weight loss. There is no fever chills or sweats. This patient is a lifelong never smoker. The patient states that the last episode of pneumonia was accompanied by extreme fatigue and severe sharp pain in the back. The patient had severe dyspnea as well. Patient very hard time taking deep breath. Patient was placed on oral clindamycin with some resolution of symptoms. A CT scan of the chest was obtained and did show evidence of a rounded mass in the right upper lobe posterior segment with pleural base and areas of necrotizing [place]. The patient denies any real wheeze and denies any edema in the lower extremities. The patient does take omeprazole daily but takes this at bedtime after eating dinner This patient had a history gastric bypass but does not follow with gastroenterology an irregular basis at this time   Past Medical History  Diagnosis Date  . Anxiety   . GERD (gastroesophageal reflux disease)   . Insomnia   . Pneumonia   . Hypertension   . Diabetes   . Hyperlipidemia   .  Pneumococcal pneumonia 2000     Family History  Problem Relation Age of Onset  . Colon cancer      grandmother  . Heart attack      grandfather  . Diabetes Father     father  . Hyperlipidemia    . Hypertension Mother   . Asthma Daughter      History   Social History  . Marital Status: Legally Separated    Spouse Name: N/A    Number of Children: N/A  . Years of Education: N/A   Occupational History  . Advertising Accountant    Social History Main Topics  . Smoking status: Never Smoker   . Smokeless tobacco: Never Used  . Alcohol Use: Yes     Comment: 1 glass per month  . Drug Use: No  . Sexual Activity: Not on file   Other Topics Concern  . Not on file   Social History Narrative  . No narrative on file     Allergies  Allergen Reactions  . Levaquin [Levofloxacin In D5w] Hives  . Penicillins      Outpatient Prescriptions Prior to Visit  Medication Sig Dispense Refill  . ALPRAZolam (XANAX) 1 MG tablet Take 1 tablet (1 mg total) by mouth 3 (three) times daily as needed for sleep.  90 tablet  1  . hydrochlorothiazide (HYDRODIURIL) 25 MG tablet Take 1 tablet (25 mg total) by mouth daily.  30 tablet  3  . oxyCODONE-acetaminophen (PERCOCET) 10-325 MG per tablet Take 1 tablet by   mouth every 6 (six) hours as needed for pain.  30 tablet  0  . promethazine (PHENERGAN) 25 MG tablet Take 1 tablet (25 mg total) by mouth every 6 (six) hours as needed for nausea.  30 tablet  0  . SUMAtriptan (IMITREX) 25 MG tablet Take 1 tablet (25 mg total) by mouth every 2 (two) hours as needed for migraine.  10 tablet  0  . traZODone (DESYREL) 100 MG tablet 1-2 tabs at bedtime only as needed.  60 tablet  5  . Vitamin D, Ergocalciferol, (DRISDOL) 50000 UNITS CAPS Take 50,000 Units by mouth every 7 (seven) days.       . omeprazole (PRILOSEC) 40 MG capsule Take 40 mg by mouth daily.      . clindamycin (CLEOCIN) 300 MG capsule Take 1 capsule (300 mg total) by mouth 3 (three) times daily.  30  capsule  0   No facility-administered medications prior to visit.      Review of Systems  Constitutional: Positive for activity change and fatigue. Negative for fever, chills, diaphoresis, appetite change and unexpected weight change.  HENT: Positive for congestion, sore throat, sneezing, postnasal drip and sinus pressure. Negative for hearing loss, ear pain, nosebleeds, facial swelling, rhinorrhea, mouth sores, trouble swallowing, neck pain, neck stiffness, dental problem, voice change, tinnitus and ear discharge.   Eyes: Positive for discharge and itching. Negative for photophobia and visual disturbance.  Respiratory: Positive for cough and shortness of breath. Negative for apnea, choking, chest tightness, wheezing and stridor.   Cardiovascular: Positive for chest pain and leg swelling. Negative for palpitations.  Gastrointestinal: Positive for nausea. Negative for vomiting, abdominal pain, constipation, blood in stool and abdominal distention.  Genitourinary: Negative for dysuria, urgency, frequency, hematuria, flank pain, decreased urine volume and difficulty urinating.  Musculoskeletal: Positive for back pain. Negative for myalgias, joint swelling, arthralgias and gait problem.  Skin: Negative for color change, pallor and rash.  Neurological: Positive for headaches. Negative for dizziness, tremors, seizures, syncope, speech difficulty, weakness, light-headedness and numbness.  Hematological: Negative for adenopathy. Does not bruise/bleed easily.  Psychiatric/Behavioral: Positive for sleep disturbance. Negative for confusion and agitation. The patient is not nervous/anxious.        Objective:   Physical Exam  Filed Vitals:   04/14/13 0918  BP: 138/80  Pulse: 70  Temp: 99.1 F (37.3 C)  TempSrc: Oral  Height: 5' 5" (1.651 m)  Weight: 178 lb (80.74 kg)  SpO2: 99%    Gen: Pleasant, well-nourished, in no distress,  normal affect  ENT: No lesions,  mouth clear,  oropharynx  clear, no postnasal drip  Neck: No JVD, no TMG, no carotid bruits  Lungs: No use of accessory muscles, no dullness to percussion, clear without rales or rhonchi  Cardiovascular: RRR, heart sounds normal, no murmur or gallops, no peripheral edema  Abdomen: soft and NT, no HSM,  BS normal  Musculoskeletal: No deformities, no cyanosis or clubbing  Neuro: alert, non focal  Skin: Warm, no lesions or rashes   CT scan of the chest from 03/28/2013 shows there is a mass in the right upper lobe posterior segment and measures 4 x 3 x 2.9 cm in size. The margins are ill-defined. There is reticular and hazy groundglass opacification. There is areas of low attenuation that may represent necrosis. There are no other lung mass is seen. There is no significant lymphadenopathy or pleural fluid seen.  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 I am concerned about potential for gastric emptying issues leading to reflux and aspiration. I doubt this represents malignancy. I am concerned about immunodeficiency in this patient. The patient's had repeated episodes of pneumonia and prior history of pneumococcal pneumonia with empyema on the right side. Need to evaluate airway anatomy in more detail.  Plan Increase omeprazole to twice daily before meals 1/2 hour Xyzal was refilled A bronchoscopy will be obtained on Sept 3rd at 8AM, at [facility], arrive at 7AM, nothing by mouth after midnight No other medication changes Strict reflux diet Also obtain immunoglobulin G., A., M. levels and fungal assay is A gastroenterology consultation would be beneficial and will speak to primary care physician regarding this   Updated  Medication List Outpatient Encounter Prescriptions as of 04/14/2013  Medication Sig Dispense Refill  . ALPRAZolam (XANAX) 1 MG tablet Take 1 tablet (1 mg total) by mouth 3 (three) times daily as needed for sleep.  90 tablet  1  . BIOTIN PO Take 1 capsule by mouth daily.      . hydrochlorothiazide (HYDRODIURIL) 25 MG tablet Take 1 tablet (25 mg total) by mouth daily.  30 tablet  3  . Multiple Vitamin (MULTIVITAMIN) tablet Take 1 tablet by mouth 3 (three) times daily.      . omeprazole (PRILOSEC) 40 MG capsule Take 1 capsule (40 mg total) by mouth 2 (two) times daily before a meal.  60 capsule  6  . oxyCODONE-acetaminophen (PERCOCET) 10-325 MG per tablet Take 1 tablet by mouth every 6 (six) hours as needed for pain.  30 tablet  0  . promethazine (PHENERGAN) 25 MG tablet Take 1 tablet (25 mg total) by mouth every 6 (six) hours as needed for nausea.  30 tablet  0  . SUMAtriptan (IMITREX) 25 MG tablet Take 1 tablet (25 mg total) by mouth every 2 (two) hours as needed for migraine.  10 tablet  0  . traZODone (DESYREL) 100 MG tablet 1-2 tabs at bedtime only as needed.  60 tablet  5  . Vitamin D, Ergocalciferol, (DRISDOL) 50000 UNITS CAPS Take 50,000 Units by mouth every 7 (seven) days.       . [DISCONTINUED] omeprazole (PRILOSEC) 40 MG capsule Take 40 mg by mouth daily.      . levocetirizine (XYZAL) 5 MG tablet Take 1 tablet (5 mg total) by mouth every evening.  30 tablet  6  . [DISCONTINUED] clindamycin (CLEOCIN) 300 MG capsule Take 1 capsule (300 mg total) by mouth 3 (three) times daily.  30 capsule  0   No facility-administered encounter medications on file as of 04/14/2013.      

## 2013-04-20 NOTE — Progress Notes (Signed)
Video Bronchoscopy done  Intervention bronchial washing done  Procedure tolerated well  I was present for the above procedure and agree.  Shan Levans

## 2013-04-21 ENCOUNTER — Encounter (HOSPITAL_COMMUNITY): Payer: Self-pay | Admitting: Critical Care Medicine

## 2013-04-21 LAB — HYPERSENSITIVITY PNUEMONITIS PROFILE

## 2013-04-22 LAB — CULTURE, RESPIRATORY W GRAM STAIN

## 2013-04-25 ENCOUNTER — Encounter: Payer: Self-pay | Admitting: *Deleted

## 2013-04-25 ENCOUNTER — Encounter: Payer: Self-pay | Admitting: Critical Care Medicine

## 2013-04-25 ENCOUNTER — Ambulatory Visit (HOSPITAL_BASED_OUTPATIENT_CLINIC_OR_DEPARTMENT_OTHER)
Admission: RE | Admit: 2013-04-25 | Discharge: 2013-04-25 | Disposition: A | Discharge status: Home / Self Care | Payer: 59 | Source: Ambulatory Visit | Attending: Critical Care Medicine | Admitting: Critical Care Medicine

## 2013-04-25 ENCOUNTER — Telehealth: Payer: Self-pay | Admitting: Family Medicine

## 2013-04-25 ENCOUNTER — Ambulatory Visit (INDEPENDENT_AMBULATORY_CARE_PROVIDER_SITE_OTHER): Payer: 59 | Admitting: Critical Care Medicine

## 2013-04-25 ENCOUNTER — Encounter: Payer: Self-pay | Admitting: Family Medicine

## 2013-04-25 VITALS — BP 130/84 | HR 76 | Temp 98.1°F | Ht 65.0 in | Wt 180.0 lb

## 2013-04-25 DIAGNOSIS — J690 Pneumonitis due to inhalation of food and vomit: Secondary | ICD-10-CM

## 2013-04-25 DIAGNOSIS — Z23 Encounter for immunization: Secondary | ICD-10-CM

## 2013-04-25 DIAGNOSIS — R222 Localized swelling, mass and lump, trunk: Secondary | ICD-10-CM

## 2013-04-25 DIAGNOSIS — K219 Gastro-esophageal reflux disease without esophagitis: Secondary | ICD-10-CM

## 2013-04-25 DIAGNOSIS — J189 Pneumonia, unspecified organism: Secondary | ICD-10-CM

## 2013-04-25 DIAGNOSIS — R918 Other nonspecific abnormal finding of lung field: Secondary | ICD-10-CM

## 2013-04-25 MED ORDER — OXYCODONE-ACETAMINOPHEN 10-325 MG PO TABS: 1.0000 | ORAL_TABLET | Freq: Four times a day (QID) | ORAL | Status: DC | PRN

## 2013-04-25 MED ORDER — PROMETHAZINE HCL 25 MG PO TABS: 25.0000 mg | ORAL_TABLET | Freq: Four times a day (QID) | ORAL | Status: DC | PRN

## 2013-04-25 NOTE — Patient Instructions (Addendum)
Flu vaccine was given Stay on omeprazole Stay on reflux diet A chest xray will be given Refills on phenegren/percocet given I recommend a gastroenterology referral, communicated to your PCP Return 2 months

## 2013-04-25 NOTE — Progress Notes (Signed)
Subjective:    Patient ID: Kimberly Wade, female    DOB: 1971/04/19, 42 y.o.   MRN: [id number]  HPI  42 y.o.F This patient has had recurrent pneumonia the right lung for 1-1/2 years. The patient was first diagnosed with pneumococcal pneumonia in the year 2000.  At that time the patient had empyema on the right and required mechanical ventilation. The patient subsequently needed video-assisted thorascopic surgery with drainage. The patient did well until mid 2013 when the patient developed another bout of pneumonia the right lung. Subsequently to this 6 months later 2 more bouts of pneumonia occurred. The patient states that each episode is accompanied with a sensation of aspiration particularly at night. The patient does have high level reflux and has ongoing heartburn on a daily basis. The patient complains of right-sided chest pain and dry cough. There is no excess mucus production. There is no wheezing. There's no weight loss. There is no fever chills or sweats. This patient is a lifelong never smoker. The patient states that the last episode of pneumonia was accompanied by extreme fatigue and severe sharp pain in the back. The patient had severe dyspnea as well. Patient very hard time taking deep breath. Patient was placed on oral clindamycin with some resolution of symptoms. A CT scan of the chest was obtained and did show evidence of a rounded mass in the right upper lobe posterior segment with pleural base and areas of necrotizing [place]. The patient denies any real wheeze and denies any edema in the lower extremities. The patient does take omeprazole daily but takes this at bedtime after eating dinner This patient had a history gastric bypass but does not follow with gastroenterology an irregular basis at this time   04/25/2013 Chief Complaint  Patient presents with  . 2 wk follow up    would like to discuss bronch results.  Breathing is unchanged.  Chest tightness today.  No SOB,  wheezing, or cough.  The patient returns in followup and is improved. There is no cough or chest discomfort. There is less wheezing and less mucus production. Bronchoscopy was performed and showed no endobronchial lesion and no purulence seen in the right upper lobe. Chest x-ray has shown gradual improvement in right upper lobe infiltrate.  The patient has reflux symptoms but is improved on omeprazole.  Past Medical History  Diagnosis Date  . Anxiety   . GERD (gastroesophageal reflux disease)   . Insomnia   . Pneumonia   . Hypertension   . Diabetes   . Hyperlipidemia   . Pneumococcal pneumonia 2000     Family History  Problem Relation Age of Onset  . Colon cancer      grandmother  . Heart attack      grandfather  . Diabetes Father     father  . Hyperlipidemia    . Hypertension Mother   . Asthma Daughter      History   Social History  . Marital Status: Legally Separated    Spouse Name: N/A    Number of Children: N/A  . Years of Education: N/A   Occupational History  . Advertising Accountant    Social History Main Topics  . Smoking status: Never Smoker   . Smokeless tobacco: Never Used  . Alcohol Use: Yes     Comment: 1 glass per month  . Drug Use: No  . Sexual Activity: Not on file   Other Topics Concern  . Not on file   Social History  Narrative  . No narrative on file     Allergies  Allergen Reactions  . Levaquin [Levofloxacin In D5w] Hives  . Penicillins      Outpatient Prescriptions Prior to Visit  Medication Sig Dispense Refill  . ALPRAZolam (XANAX) 1 MG tablet Take 1 tablet (1 mg total) by mouth 3 (three) times daily as needed for sleep.  90 tablet  1  . BIOTIN PO Take 1 capsule by mouth daily.      . hydrochlorothiazide (HYDRODIURIL) 25 MG tablet Take 1 tablet (25 mg total) by mouth daily.  30 tablet  3  . levocetirizine (XYZAL) 5 MG tablet Take 1 tablet (5 mg total) by mouth every evening.  30 tablet  6  . Multiple Vitamin (MULTIVITAMIN)  tablet Take 1 tablet by mouth 3 (three) times daily.      [name] omeprazole (PRILOSEC) 40 MG capsule Take 1 capsule (40 mg total) by mouth 2 (two) times daily before a meal.  60 capsule  6  . SUMAtriptan (IMITREX) 25 MG tablet Take 1 tablet (25 mg total) by mouth every 2 (two) hours as needed for migraine.  10 tablet  0  . traZODone (DESYREL) 100 MG tablet 1-2 tabs at bedtime only as needed.  60 tablet  5  . Vitamin D, Ergocalciferol, (DRISDOL) 50000 UNITS CAPS Take 50,000 Units by mouth every 7 (seven) days.       [name] oxyCODONE-acetaminophen (PERCOCET) 10-325 MG per tablet Take 1 tablet by mouth every 6 (six) hours as needed for pain.  30 tablet  0  . promethazine (PHENERGAN) 25 MG tablet Take 1 tablet (25 mg total) by mouth every 6 (six) hours as needed for nausea.  30 tablet  0   No facility-administered medications prior to visit.      Review of Systems  Constitutional: Positive for activity change and fatigue. Negative for fever, chills, diaphoresis, appetite change and unexpected weight change.  HENT: Positive for congestion, sore throat, sneezing, postnasal drip and sinus pressure. Negative for hearing loss, ear pain, nosebleeds, facial swelling, rhinorrhea, mouth sores, trouble swallowing, neck pain, neck stiffness, dental problem, voice change, tinnitus and ear discharge.   Eyes: Positive for discharge and itching. Negative for photophobia and visual disturbance.  Respiratory: Positive for cough and shortness of breath. Negative for apnea, choking, chest tightness, wheezing and stridor.   Cardiovascular: Positive for chest pain and leg swelling. Negative for palpitations.  Gastrointestinal: Positive for nausea. Negative for vomiting, abdominal pain, constipation, blood in stool and abdominal distention.  Genitourinary: Negative for dysuria, urgency, frequency, hematuria, flank pain, decreased urine volume and difficulty urinating.  Musculoskeletal: Positive for back pain. Negative for  myalgias, joint swelling, arthralgias and gait problem.  Skin: Negative for color change, pallor and rash.  Neurological: Positive for headaches. Negative for dizziness, tremors, seizures, syncope, speech difficulty, weakness, light-headedness and numbness.  Hematological: Negative for adenopathy. Does not bruise/bleed easily.  Psychiatric/Behavioral: Positive for sleep disturbance. Negative for confusion and agitation. The patient is not nervous/anxious.        Objective:   Physical Exam   Filed Vitals:   04/25/13 1527  BP: 130/84  Pulse: 76  Temp: 98.1 F (36.7 C)  TempSrc: Oral  Height: 5\' 5"  (1.651 m)  Weight: 180 lb (81.647 kg)  SpO2: 99%    Gen: Pleasant, well-nourished, in no distress,  normal affect  ENT: No lesions,  mouth clear,  oropharynx clear, no postnasal drip  Neck: No JVD, no TMG, no carotid bruits  Lungs: No use of accessory muscles, no dullness to percussion, clear without rales or rhonchi  Cardiovascular: RRR, heart sounds normal, no murmur or gallops, no peripheral edema  Abdomen: soft and NT, no HSM,  BS normal  Musculoskeletal: No deformities, no cyanosis or clubbing  Neuro: alert, non focal  Skin: Warm, no lesions or rashes   Dg Chest 2 View  04/25/2013   *RADIOLOGY REPORT*  Clinical Data: Right upper lobe pneumonia, follow up.  CHEST - 2 VIEW  Comparison: 04/05/2013 and CT chest 03/30/2013.  Findings: Trachea is midline.  Heart size normal.  Near complete resolution of right upper lobe pneumonia with linear density in its place.  Blunting of the right costophrenic angle appears chronic. Lungs are otherwise clear.  No pleural fluid.  IMPRESSION: Near complete resolution of right upper lobe pneumonia with probable residual scarring.   Original Report Authenticated By: Leanna Battles, M.D.        Assessment & Plan:   Lung mass Right upper lobe organized pneumonia due to aspiration syndrome. This has now resolved based on the chest x-ray from  04/25/2013. I would suggest a gastroenterology consultation however because of the possibility of recurrent aspiration pneumonia This patient would benefit from a gastric emptying study.  Plan No additional antibiotics indicated Gastroenterology consultation for reflux aspiration regurgitation and gastric emptying would be indicated  GERD (gastroesophageal reflux disease) High level reflux with aspiration syndrome Plan The primary care physician has made a gastroenterology referral and this is appreciated    Updated Medication List Outpatient Encounter Prescriptions as of 04/25/2013  Medication Sig Dispense Refill  . ALPRAZolam (XANAX) 1 MG tablet Take 1 tablet (1 mg total) by mouth 3 (three) times daily as needed for sleep.  90 tablet  1  . BIOTIN PO Take 1 capsule by mouth daily.      . hydrochlorothiazide (HYDRODIURIL) 25 MG tablet Take 1 tablet (25 mg total) by mouth daily.  30 tablet  3  . levocetirizine (XYZAL) 5 MG tablet Take 1 tablet (5 mg total) by mouth every evening.  30 tablet  6  . Multiple Vitamin (MULTIVITAMIN) tablet Take 1 tablet by mouth 3 (three) times daily.      [name] omeprazole (PRILOSEC) 40 MG capsule Take 1 capsule (40 mg total) by mouth 2 (two) times daily before a meal.  60 capsule  6  . oxyCODONE-acetaminophen (PERCOCET) 10-325 MG per tablet Take 1 tablet by mouth every 6 (six) hours as needed for pain.  30 tablet  0  . promethazine (PHENERGAN) 25 MG tablet Take 1 tablet (25 mg total) by mouth every 6 (six) hours as needed for nausea.  30 tablet  0  . SUMAtriptan (IMITREX) 25 MG tablet Take 1 tablet (25 mg total) by mouth every 2 (two) hours as needed for migraine.  10 tablet  0  . traZODone (DESYREL) 100 MG tablet 1-2 tabs at bedtime only as needed.  60 tablet  5  . Vitamin D, Ergocalciferol, (DRISDOL) 50000 UNITS CAPS Take 50,000 Units by mouth every 7 (seven) days.       . [DISCONTINUED] oxyCODONE-acetaminophen (PERCOCET) 10-325 MG per tablet Take 1 tablet by  mouth every 6 (six) hours as needed for pain.  30 tablet  0  . [DISCONTINUED] promethazine (PHENERGAN) 25 MG tablet Take 1 tablet (25 mg total) by mouth every 6 (six) hours as needed for nausea.  30 tablet  0   No facility-administered encounter medications on file as of 04/25/2013.

## 2013-04-25 NOTE — Telephone Encounter (Signed)
Sue Lush, Will you please let mrs. Kimberly Wade know that her pulmonologist Dr. Delford Field has recommended a gastroenterology referral to look into reflux management to help with preventing future aspiration pneumonia episodes.  I've ordered this referral so if she's not heard about scheduling sometime this week please let me know.

## 2013-04-25 NOTE — Telephone Encounter (Signed)
Left message on vm

## 2013-04-26 NOTE — Assessment & Plan Note (Signed)
High level reflux with aspiration syndrome Plan The primary care physician has made a gastroenterology referral and this is appreciated

## 2013-04-26 NOTE — Assessment & Plan Note (Signed)
Right upper lobe organized pneumonia due to aspiration syndrome. This has now resolved based on the chest x-ray from 04/25/2013. I would suggest a gastroenterology consultation however because of the possibility of recurrent aspiration pneumonia This patient would benefit from a gastric emptying study.  Plan No additional antibiotics indicated Gastroenterology consultation for reflux aspiration regurgitation and gastric emptying would be indicated

## 2013-04-29 ENCOUNTER — Ambulatory Visit: Payer: 59 | Admitting: Family Medicine

## 2013-05-02 ENCOUNTER — Encounter (HOSPITAL_COMMUNITY): Admission: RE | Payer: Self-pay | Source: Ambulatory Visit

## 2013-05-02 ENCOUNTER — Ambulatory Visit (HOSPITAL_COMMUNITY): Admission: RE | Admit: 2013-05-02 | Payer: 59 | Source: Ambulatory Visit | Admitting: Critical Care Medicine

## 2013-05-02 ENCOUNTER — Encounter: Payer: Self-pay | Admitting: Critical Care Medicine

## 2013-05-02 ENCOUNTER — Encounter: Payer: Self-pay | Admitting: Family Medicine

## 2013-05-02 DIAGNOSIS — T17800A Unspecified foreign body in other parts of respiratory tract causing asphyxiation, initial encounter: Secondary | ICD-10-CM | POA: Insufficient documentation

## 2013-05-02 SURGERY — BRONCHOSCOPY, WITH FLUOROSCOPY
Anesthesia: Moderate Sedation

## 2013-05-16 LAB — FUNGUS CULTURE W SMEAR

## 2013-05-27 ENCOUNTER — Ambulatory Visit: Payer: 59 | Admitting: Family Medicine

## 2013-05-30 ENCOUNTER — Encounter (HOSPITAL_BASED_OUTPATIENT_CLINIC_OR_DEPARTMENT_OTHER): Payer: Self-pay | Admitting: Emergency Medicine

## 2013-05-30 ENCOUNTER — Ambulatory Visit: Payer: 59 | Admitting: Family Medicine

## 2013-05-30 ENCOUNTER — Emergency Department (HOSPITAL_BASED_OUTPATIENT_CLINIC_OR_DEPARTMENT_OTHER): Payer: 59

## 2013-05-30 ENCOUNTER — Emergency Department (HOSPITAL_BASED_OUTPATIENT_CLINIC_OR_DEPARTMENT_OTHER)
Admission: EM | Admit: 2013-05-30 | Discharge: 2013-05-30 | Disposition: A | Discharge status: Home / Self Care | Payer: 59 | Attending: Emergency Medicine | Admitting: Emergency Medicine

## 2013-05-30 DIAGNOSIS — Z880 Allergy status to penicillin: Secondary | ICD-10-CM | POA: Insufficient documentation

## 2013-05-30 DIAGNOSIS — I10 Essential (primary) hypertension: Secondary | ICD-10-CM | POA: Insufficient documentation

## 2013-05-30 DIAGNOSIS — J189 Pneumonia, unspecified organism: Secondary | ICD-10-CM

## 2013-05-30 DIAGNOSIS — K219 Gastro-esophageal reflux disease without esophagitis: Secondary | ICD-10-CM | POA: Insufficient documentation

## 2013-05-30 DIAGNOSIS — Z79899 Other long term (current) drug therapy: Secondary | ICD-10-CM | POA: Insufficient documentation

## 2013-05-30 DIAGNOSIS — E119 Type 2 diabetes mellitus without complications: Secondary | ICD-10-CM | POA: Insufficient documentation

## 2013-05-30 DIAGNOSIS — M549 Dorsalgia, unspecified: Secondary | ICD-10-CM | POA: Insufficient documentation

## 2013-05-30 DIAGNOSIS — R110 Nausea: Secondary | ICD-10-CM | POA: Insufficient documentation

## 2013-05-30 DIAGNOSIS — F411 Generalized anxiety disorder: Secondary | ICD-10-CM | POA: Insufficient documentation

## 2013-05-30 DIAGNOSIS — N390 Urinary tract infection, site not specified: Secondary | ICD-10-CM | POA: Insufficient documentation

## 2013-05-30 LAB — URINALYSIS, ROUTINE W REFLEX MICROSCOPIC
Bilirubin Urine: NEGATIVE
Glucose, UA: NEGATIVE mg/dL
Ketones, ur: NEGATIVE mg/dL
Nitrite: NEGATIVE
Specific Gravity, Urine: 1.011 (ref 1.005–1.030)
pH: 6.5 (ref 5.0–8.0)

## 2013-05-30 LAB — CBC WITH DIFFERENTIAL/PLATELET
Eosinophils Absolute: 0.2 10*3/uL (ref 0.0–0.7)
HCT: 34.3 % — ABNORMAL LOW (ref 36.0–46.0)
Hemoglobin: 11.5 g/dL — ABNORMAL LOW (ref 12.0–15.0)
Lymphs Abs: 0.8 10*3/uL (ref 0.7–4.0)
MCH: 29.3 pg (ref 26.0–34.0)
MCHC: 33.5 g/dL (ref 30.0–36.0)
Monocytes Absolute: 1.1 10*3/uL — ABNORMAL HIGH (ref 0.1–1.0)
Monocytes Relative: 7 % (ref 3–12)
Neutrophils Relative %: 87 % — ABNORMAL HIGH (ref 43–77)
RBC: 3.92 MIL/uL (ref 3.87–5.11)

## 2013-05-30 LAB — BASIC METABOLIC PANEL
BUN: 10 mg/dL (ref 6–23)
Chloride: 103 mEq/L (ref 96–112)
Creatinine, Ser: 0.5 mg/dL (ref 0.50–1.10)
GFR calc non Af Amer: >90 mL/min (ref >90)
Glucose, Bld: 130 mg/dL — ABNORMAL HIGH (ref 70–99)
Potassium: 3.6 mEq/L (ref 3.5–5.1)

## 2013-05-30 LAB — URINE MICROSCOPIC-ADD ON

## 2013-05-30 MED ORDER — PROMETHAZINE HCL 25 MG PO TABS
25.0000 mg | ORAL_TABLET | Freq: Once | ORAL | Status: AC
  Administered 2013-05-30: 25 mg via ORAL
  Filled 2013-05-30: qty 1

## 2013-05-30 MED ORDER — PROMETHAZINE HCL 25 MG PO TABS: 25.0000 mg | ORAL_TABLET | Freq: Four times a day (QID) | ORAL | Status: DC | PRN

## 2013-05-30 MED ORDER — NITROFURANTOIN MONOHYD MACRO 100 MG PO CAPS: 100.0000 mg | ORAL_CAPSULE | Freq: Two times a day (BID) | ORAL | Status: DC

## 2013-05-30 MED ORDER — CLINDAMYCIN HCL 300 MG PO CAPS: 300.0000 mg | ORAL_CAPSULE | Freq: Three times a day (TID) | ORAL | Status: DC

## 2013-05-30 MED ORDER — OXYCODONE-ACETAMINOPHEN 5-325 MG PO TABS
1.0000 | ORAL_TABLET | Freq: Once | ORAL | Status: AC
  Administered 2013-05-30: 1 via ORAL
  Filled 2013-05-30: qty 1

## 2013-05-30 MED ORDER — OXYCODONE-ACETAMINOPHEN 10-325 MG PO TABS: 1.0000 | ORAL_TABLET | Freq: Four times a day (QID) | ORAL | Status: DC | PRN

## 2013-05-30 MED ORDER — CLINDAMYCIN HCL 150 MG PO CAPS
300.0000 mg | ORAL_CAPSULE | Freq: Once | ORAL | Status: AC
  Administered 2013-05-30: 300 mg via ORAL
  Filled 2013-05-30: qty 2

## 2013-05-30 NOTE — ED Provider Notes (Signed)
CSN: [id number]     Arrival date & time 05/30/13  1337 History   First MD Initiated Contact with Patient 05/30/13 1422     Chief Complaint  Patient presents with  . Shortness of Breath  . Pain   (Consider location/radiation/quality/duration/timing/severity/associated sxs/prior Treatment) Patient is a 42 y.o. female presenting with shortness of breath. The history is provided by the patient. No language interpreter was used.  Shortness of Breath Severity:  Moderate Onset quality:  Sudden Duration:  1 day Timing:  Constant Progression:  Worsening Chronicity:  Recurrent Relieved by:  None tried Worsened by:  Activity and exertion Ineffective treatments:  None tried Associated symptoms: cough   Associated symptoms: no chest pain and no vomiting   Associated symptoms comment:  Nasuea, no vomiting, low back pain Risk factors comment:  Recurrent pneumonia   Past Medical History  Diagnosis Date  . Anxiety   . GERD (gastroesophageal reflux disease)   . Insomnia   . Pneumonia   . Hypertension   . Diabetes   . Hyperlipidemia   . Pneumococcal pneumonia 2000   Past Surgical History  Procedure Laterality Date  . Cesarean section      x4  . Chest tube insertion    . Gastric bypass    . Dysplastic mole removed      right ankle  . Chest tubes  2000  . Recoonstruction of right lung    . Mini gastric bypass  2011  . Video bronchoscopy Bilateral 04/20/2013    Procedure: VIDEO BRONCHOSCOPY WITH FLUORO;  Surgeon: Storm Frisk, MD;  Location: [facility];  Service: Cardiopulmonary;  Laterality: Bilateral;   Family History  Problem Relation Age of Onset  . Colon cancer      grandmother  . Heart attack      grandfather  . Diabetes Father     father  . Hyperlipidemia    . Hypertension Mother   . Asthma Daughter    History  Substance Use Topics  . Smoking status: Never Smoker   . Smokeless tobacco: Never Used  . Alcohol Use: Yes     Comment: 1 glass per month   OB  History   Grav Para Term Preterm Abortions TAB SAB Ect Mult Living                 Review of Systems  Constitutional: Positive for fatigue.  Respiratory: Positive for cough and shortness of breath.   Cardiovascular: Negative for chest pain.  Gastrointestinal: Positive for nausea. Negative for vomiting.  Musculoskeletal: Positive for back pain.  All other systems reviewed and are negative.    Allergies  Levaquin and Penicillins  Home Medications   Current Outpatient Rx  Name  Route  Sig  Dispense  Refill  . ALPRAZolam (XANAX) 1 MG tablet   Oral   Take 1 tablet (1 mg total) by mouth 3 (three) times daily as needed for sleep.   90 tablet   1   . BIOTIN PO   Oral   Take 1 capsule by mouth daily.         . clindamycin (CLEOCIN) 300 MG capsule   Oral   Take 1 capsule (300 mg total) by mouth 3 (three) times daily.   21 capsule   0   . hydrochlorothiazide (HYDRODIURIL) 25 MG tablet   Oral   Take 1 tablet (25 mg total) by mouth daily.   30 tablet   3   . levocetirizine (XYZAL) 5 MG tablet  Oral   Take 1 tablet (5 mg total) by mouth every evening.   30 tablet   6   . Multiple Vitamin (MULTIVITAMIN) tablet   Oral   Take 1 tablet by mouth 3 (three) times daily.         . nitrofurantoin, macrocrystal-monohydrate, (MACROBID) 100 MG capsule   Oral   Take 1 capsule (100 mg total) by mouth 2 (two) times daily.   10 capsule   0   . omeprazole (PRILOSEC) 40 MG capsule   Oral   Take 1 capsule (40 mg total) by mouth 2 (two) times daily before a meal.   60 capsule   6   . oxyCODONE-acetaminophen (PERCOCET) 10-325 MG per tablet   Oral   Take 1 tablet by mouth every 6 (six) hours as needed for pain.   30 tablet   0   . oxyCODONE-acetaminophen (PERCOCET) 10-325 MG per tablet   Oral   Take 1 tablet by mouth every 6 (six) hours as needed for pain.   16 tablet   0   . promethazine (PHENERGAN) 25 MG tablet   Oral   Take 1 tablet (25 mg total) by mouth every 6  (six) hours as needed for nausea.   30 tablet   0   . promethazine (PHENERGAN) 25 MG tablet   Oral   Take 1 tablet (25 mg total) by mouth every 6 (six) hours as needed for nausea.   30 tablet   0   . SUMAtriptan (IMITREX) 25 MG tablet   Oral   Take 1 tablet (25 mg total) by mouth every 2 (two) hours as needed for migraine.   10 tablet   0   . traZODone (DESYREL) 100 MG tablet      1-2 tabs at bedtime only as needed.   60 tablet   5   . Vitamin D, Ergocalciferol, (DRISDOL) 50000 UNITS CAPS   Oral   Take 50,000 Units by mouth every 7 (seven) days.           BP 141/87  Pulse 72  Temp(Src) 98.6 F (37 C) (Oral)  Resp 18  Ht 5\' 6"  (1.676 m)  Wt 175 lb (79.379 kg)  BMI 28.26 kg/m2  SpO2 99%  LMP 05/07/2013 Physical Exam  Nursing note and vitals reviewed. Constitutional: She is oriented to person, place, and time. She appears well-developed and well-nourished.  HENT:  Head: Normocephalic.  Eyes: Pupils are equal, round, and reactive to light.  Cardiovascular: Normal rate and regular rhythm.   Pulmonary/Chest: Effort normal and breath sounds normal. She has no wheezes. She exhibits no tenderness.  Abdominal: Soft. Bowel sounds are normal.  Musculoskeletal: She exhibits no edema and no tenderness.  Lymphadenopathy:    She has no cervical adenopathy.  Neurological: She is alert and oriented to person, place, and time.  Skin: Skin is warm and dry.  Psychiatric: She has a normal mood and affect. Her behavior is normal. Judgment and thought content normal.    ED Course  Procedures (including critical care time) Labs Review Labs Reviewed  URINALYSIS, ROUTINE W REFLEX MICROSCOPIC - Abnormal; Notable for the following:    Hgb urine dipstick MODERATE (*)    Leukocytes, UA TRACE (*)    All other components within normal limits  CBC WITH DIFFERENTIAL - Abnormal; Notable for the following:    WBC 16.2 (*)    Hemoglobin 11.5 (*)    HCT 34.3 (*)    Neutrophils Relative %  87 (*)    Neutro Abs 14.1 (*)    Lymphocytes Relative 5 (*)    Monocytes Absolute 1.1 (*)    All other components within normal limits  BASIC METABOLIC PANEL - Abnormal; Notable for the following:    Glucose, Bld 130 (*)    All other components within normal limits  URINE MICROSCOPIC-ADD ON - Abnormal; Notable for the following:    Bacteria, UA MANY (*)    All other components within normal limits  URINE CULTURE   Imaging Review Dg Chest 2 View  05/30/2013   CLINICAL DATA:  Shortness of breath and back pain. History of right lung surgery in 2000.  EXAM: CHEST  2 VIEW  COMPARISON:  04/25/2013.  FINDINGS: The heart remains normal in size. Interval patchy opacity in the lingula. Clear right lung. Mild diffuse peribronchial thickening and accentuation of the interstitial markings without significant change. Lower thoracic spine degenerative changes.  IMPRESSION: 1. Interval lingular pneumonia. 2. Stable mild chronic bronchitic changes and mild chronic interstitial lung disease.   Electronically Signed   By: Gordan Payment M.D.   On: 05/30/2013 14:39    EKG Interpretation   None      Patient with history of recurrent pneumonia.  Developed shortness of breath, fatigue, generalized myalgia and low back back over the last 24 hours.  Radiology and lab results reviewed.  Lingular pneumonia, mild leukocytosis, UTI.  Review of records indicate clindamycin has been effective for her pneumonia.  Patient discharged home with antibiotic coverage for pneumonia and UTI, follow-up by her PCP. MDM   1. Recurrent pneumonia   2. UTI (lower urinary tract infection)        Jimmye Norman, NP 05/30/13 1646

## 2013-05-30 NOTE — ED Notes (Signed)
Pt c/o Generalized pain over entire body, worst in lower back and collar bone/shoulder. Pt sts in 2000 she had pneumococcal PNA and that this pain is similar.  Pt also reports SOB. Denies Chest pain. C/o Nausea, no vomitting or diarrhea. NAD noted.

## 2013-05-30 NOTE — ED Notes (Signed)
MD at bedside. 

## 2013-05-30 NOTE — ED Notes (Signed)
Pt reports no more HTN, diabetes, high cholesterol after gastric bypass in 2011.

## 2013-05-31 LAB — URINE CULTURE: Colony Count: 30000

## 2013-06-01 ENCOUNTER — Encounter: Payer: Self-pay | Admitting: Family Medicine

## 2013-06-01 ENCOUNTER — Ambulatory Visit (INDEPENDENT_AMBULATORY_CARE_PROVIDER_SITE_OTHER): Payer: 59 | Admitting: Family Medicine

## 2013-06-01 VITALS — BP 132/84 | HR 80 | Temp 98.0°F | Wt 187.0 lb

## 2013-06-01 DIAGNOSIS — J690 Pneumonitis due to inhalation of food and vomit: Secondary | ICD-10-CM

## 2013-06-01 DIAGNOSIS — T148XXA Other injury of unspecified body region, initial encounter: Secondary | ICD-10-CM

## 2013-06-01 MED ORDER — CARISOPRODOL 350 MG PO TABS: 350.0000 mg | ORAL_TABLET | Freq: Three times a day (TID) | ORAL | Status: DC | PRN

## 2013-06-01 MED ORDER — OXYCODONE-ACETAMINOPHEN 10-325 MG PO TABS: 1.5000 | ORAL_TABLET | Freq: Four times a day (QID) | ORAL | Status: DC | PRN

## 2013-06-01 MED ORDER — FLUCONAZOLE 150 MG PO TABS: ORAL_TABLET | ORAL | Status: AC

## 2013-06-01 NOTE — Progress Notes (Signed)
CC: Kimberly Wade is a 42 y.o. female is here for Pneumonia and Urinary Tract Infection   Subjective: HPI:  Followup aspiration pneumonia: She was diagnosed with a lingular pneumonia on Monday. She was started on clindamycin. Prior to this she was complaining of 24 hours of shortness of breath, painful deep breathing in the left and right side, nonproductive cough, fatigue. Fortunately she states that shortness of breath is absent, she is rarely coughing, it is nonproductive. She's getting some energy back but to rest for the past 48 hours. She does still have some pain in her left side but no longer in the right chest. She is understandably worried because she was told that the pneumonia is in her right chest and at the left was clear. She's been taking Percocet 10 mg 3 times a day pain in the left chest is still moderate in severity interfering with sleep worse with deep breathing, headache, cough. She denies confusion, headache, motor sensory disturbances, exertional chest pain  Patient complains of right buttock pain that has been present only for the past one to 2 days. She's worried it may be due to her kidney infection was diagnosed on Monday. Pain is worse with sudden movements, walking, coughing. It is barely improved with Percocet no other things make it better or worse. It is greatly interfering with her sleep. It radiates throughout the right buttock but travels nor rales. She denies urinary frequency, dysuria, urgency, constipation, diarrhea, flank pain, nor gross hematuria. She denies abdominal pain whatsoever.  She's had some vaginal itching that started the day after she started Macrobid and clindamycin she denies vaginal discharge nor bleeding   Review Of Systems Outlined In HPI  Past Medical History  Diagnosis Date  . Anxiety   . GERD (gastroesophageal reflux disease)   . Insomnia   . Pneumonia   . Hypertension   . Diabetes   . Hyperlipidemia   . Pneumococcal pneumonia  2000     Family History  Problem Relation Age of Onset  . Colon cancer      grandmother  . Heart attack      grandfather  . Diabetes Father     father  . Hyperlipidemia    . Hypertension Mother   . Asthma Daughter      History  Substance Use Topics  . Smoking status: Never Smoker   . Smokeless tobacco: Never Used  . Alcohol Use: Yes     Comment: 1 glass per month     Objective: Filed Vitals:   06/01/13 1510  BP: 132/84  Pulse: 80  Temp: 98 F (36.7 C)    General: Alert and Oriented, No Acute Distress HEENT: Pupils equal, round, reactive to light. Conjunctivae clear.  External ears unremarkable, canals clear with intact TMs with appropriate landmarks.  Middle ear appears open without effusion. Pink inferior turbinates.  Moist mucous membranes, pharynx without inflammation nor lesions.  Neck supple without palpable lymphadenopathy nor abnormal masses. Lungs:  comfortable work of breathing, good air movement, with no rhonchi nor wheezing she does have some mild rails in the left lateral lung field otherwise rails are absent. Cardiac: Regular rate and rhythm. Normal S1/S2.  No murmurs, rubs, nor gallops.   Back: No CVA tenderness bilaterally, no midline spinous process tenderness in the lumbar spine nor paraspinal muscle tenderness. She has full range of motion in flexion extension and rotation without discomfort in the lumbar spine. Pain is reproduced with palpation of right buttock muscle Extremities: No  peripheral edema.  Strong peripheral pulses.  Negative straight leg raise, negative log roll, negative FABER/FADIR, full range of motion strength throughout the right lower extremity Mental Status: No depression, anxiety, nor agitation. Skin: Warm and dry. No rashes at sites of discomfort  Assessment & Plan: Kimberly Wade was seen today for pneumonia and urinary tract infection.  Diagnoses and associated orders for this visit:  Muscle strain - carisoprodol (SOMA) 350 MG  tablet; Take 1 tablet (350 mg total) by mouth 3 (three) times daily as needed for muscle spasms.  Aspiration pneumonia - oxyCODONE-acetaminophen (PERCOCET) 10-325 MG per tablet; Take 1.5-2 tablets by mouth every 6 (six) hours as needed for pain. - CBC w/Diff  Other Orders - fluconazole (DIFLUCAN) 150 MG tablet; Take one tab, may take second tab if no improvement after 72 hours.    Muscle strain: Discussed with patient there is a low likelihood that her urinary tract infection, if she even had a urinary tract infection (see culture) is contributing to her right buttock pain, suspect muscle strain therefore start soma Aspiration pneumonia: Clinically this sounds like this is stable and even improving, I've asked her to continue out clindamycin and will obtain a white count today to compare to Monday's value. It sounds that she actually has tolerated Levaquin, we could change antibiotics to Avelox if any decline occurs Diflucan for what sounds like vaginal candidiasis secondary to antibiotic use.  25 minutes spent face-to-face during visit today of which at least 50% was counseling or coordinating care regarding muscle strain, aspiration pneumonia, vaginal candidiasis   Return if symptoms worsen or fail to improve.

## 2013-06-02 LAB — CBC WITH DIFFERENTIAL/PLATELET
Basophils Relative: 0 % (ref 0–1)
HCT: 31.2 % — ABNORMAL LOW (ref 36.0–46.0)
Hemoglobin: 10.4 g/dL — ABNORMAL LOW (ref 12.0–15.0)
Lymphocytes Relative: 18 % (ref 12–46)
Lymphs Abs: 1.5 10*3/uL (ref 0.7–4.0)
Monocytes Absolute: 0.9 10*3/uL (ref 0.1–1.0)
Monocytes Relative: 11 % (ref 3–12)
Neutro Abs: 5.6 10*3/uL (ref 1.7–7.7)
Neutrophils Relative %: 68 % (ref 43–77)
RBC: 3.67 MIL/uL — ABNORMAL LOW (ref 3.87–5.11)
WBC: 8.3 10*3/uL (ref 4.0–10.5)

## 2013-06-04 NOTE — ED Provider Notes (Signed)
Medical screening examination/treatment/procedure(s) were performed by non-physician practitioner and as supervising physician I was immediately available for consultation/collaboration.  Ethelda Chick, MD 06/04/13 [id number]

## 2013-06-09 ENCOUNTER — Encounter: Payer: Self-pay | Admitting: Family Medicine

## 2013-06-17 ENCOUNTER — Encounter: Payer: Self-pay | Admitting: Family Medicine

## 2013-06-21 ENCOUNTER — Encounter: Payer: Self-pay | Admitting: Family Medicine

## 2013-06-24 ENCOUNTER — Other Ambulatory Visit: Payer: Self-pay | Admitting: Family Medicine

## 2013-08-01 ENCOUNTER — Other Ambulatory Visit: Payer: Self-pay | Admitting: Family Medicine

## 2013-08-22 ENCOUNTER — Encounter: Payer: Self-pay | Admitting: Family Medicine

## 2013-08-22 ENCOUNTER — Ambulatory Visit (INDEPENDENT_AMBULATORY_CARE_PROVIDER_SITE_OTHER): Payer: 59 | Admitting: Family Medicine

## 2013-08-22 VITALS — BP 142/92 | HR 92 | Wt 183.0 lb

## 2013-08-22 DIAGNOSIS — M25562 Pain in left knee: Secondary | ICD-10-CM

## 2013-08-22 DIAGNOSIS — I10 Essential (primary) hypertension: Secondary | ICD-10-CM

## 2013-08-22 DIAGNOSIS — M25569 Pain in unspecified knee: Secondary | ICD-10-CM

## 2013-08-22 DIAGNOSIS — T148XXA Other injury of unspecified body region, initial encounter: Secondary | ICD-10-CM

## 2013-08-22 MED ORDER — HYDROCHLOROTHIAZIDE 50 MG PO TABS: 50.0000 mg | ORAL_TABLET | Freq: Every day | ORAL | Status: DC

## 2013-08-22 MED ORDER — OXYCODONE-ACETAMINOPHEN 10-325 MG PO TABS: 1.5000 | ORAL_TABLET | Freq: Four times a day (QID) | ORAL | Status: DC | PRN

## 2013-08-22 MED ORDER — CARISOPRODOL 350 MG PO TABS: 350.0000 mg | ORAL_TABLET | Freq: Three times a day (TID) | ORAL | Status: DC | PRN

## 2013-08-22 NOTE — Progress Notes (Signed)
CC: Kimberly Wade is a 43 y.o. female is here for hurt back sat   Subjective: HPI:  Patient complains of left knee pain that began the day after she slipped and almost fell to the ground using her left leg to support her weight while twisting to the left. Pain was absent at the time of the accident however began after awakening the day after which was Sunday. Pain is worse with knee extension and with b walking. Pain is absent when standing still. Pain is localized to the anterior lateral aspect of the knee nonradiating. No interventions as of yet other than taking leftover Percocet and soma which is helping.  She denies swelling redness or warmth of the left knee. She denies catching locking or giving way of the left knee.  Followup hypertension: Since starting hydrochlorothiazide she denies any known side effects. While she was hospitalized for bariatric surgery revision she's unsure what her blood pressures were. No outside blood pressures to report. Denies shortness of breath, cough, chest pain, peripheral edema, orthopnea, nor motor or sensory disturbances  Review Of Systems Outlined In HPI  Past Medical History  Diagnosis Date  . Anxiety   . GERD (gastroesophageal reflux disease)   . Insomnia   . Pneumonia   . Hypertension   . Diabetes   . Hyperlipidemia   . Pneumococcal pneumonia 2000     Family History  Problem Relation Age of Onset  . Colon cancer      grandmother  . Heart attack      grandfather  . Diabetes Father     father  . Hyperlipidemia    . Hypertension Mother   . Asthma Daughter      History  Substance Use Topics  . Smoking status: Never Smoker   . Smokeless tobacco: Never Used  . Alcohol Use: Yes     Comment: 1 glass per month     Objective: Filed Vitals:   08/22/13 1122  BP: 142/92  Pulse: 92    General: Alert and Oriented, No Acute Distress HEENT: Pupils equal, round, reactive to light. Conjunctivae clear.  moist mucous membranes pharynx  unremarkable Lungs: Clear to auscultation bilaterally, no wheezing/ronchi/rales.  Comfortable work of breathing. Good air movement. Cardiac: Regular rate and rhythm. Normal S1/S2.  No murmurs, rubs, nor gallops.   Extremities: No peripheral edema.  Strong peripheral pulses. Left knee has full range of motion with 4/5 strength in extension and flexion complicated by patient resisting  full participation due to pain. There is no apparent swelling warmth or redness of the knee. anterior drawer is negative, there is no laxity with valgus or varus stress however pain is reproduced with varus stress along with palpation of the LC L. otherwise there is no pain with palpation of joint line. No patellar apprehension or pain when manipulating the patella. Negative McMurray Mental Status: No depression, anxiety, nor agitation. Skin: Warm and dry.  Assessment & Plan: Kimberly Wade was seen today for hurt back sat.  Diagnoses and associated orders for this visit:  Muscle strain - carisoprodol (SOMA) 350 MG tablet; Take 1 tablet (350 mg total) by mouth 3 (three) times daily as needed for muscle spasms. - oxyCODONE-acetaminophen (PERCOCET) 10-325 MG per tablet; Take 1.5-2 tablets by mouth every 6 (six) hours as needed for pain. - hydrochlorothiazide (HYDRODIURIL) 50 MG tablet; Take 1 tablet (50 mg total) by mouth daily.  Left knee pain - carisoprodol (SOMA) 350 MG tablet; Take 1 tablet (350 mg total) by mouth  3 (three) times daily as needed for muscle spasms. - oxyCODONE-acetaminophen (PERCOCET) 10-325 MG per tablet; Take 1.5-2 tablets by mouth every 6 (six) hours as needed for pain.  Essential hypertension, benign    Left knee pain: Likely due to LCL sprain along with mild muscular strain, fortunately I do not think she completely tore her LCL there for symptom control with medications above along with home rehabilitation plan encouraged her to do a daily basis for the next 2 weeks. If no improvement in 2  weeks return for sports medicine referral. Essential hypertension: Controlled increasing hydrochlorothiazide return to-4 weeks for recheck of blood pressure  Return in about 4 weeks (around 09/19/2013) for Blood pressure followup.

## 2013-10-06 ENCOUNTER — Emergency Department (HOSPITAL_COMMUNITY)
Admission: EM | Admit: 2013-10-06 | Discharge: 2013-10-06 | Disposition: A | Discharge status: Home / Self Care | Payer: 59 | Attending: Emergency Medicine | Admitting: Emergency Medicine

## 2013-10-06 ENCOUNTER — Encounter (HOSPITAL_COMMUNITY): Payer: Self-pay | Admitting: Emergency Medicine

## 2013-10-06 DIAGNOSIS — Z79899 Other long term (current) drug therapy: Secondary | ICD-10-CM | POA: Insufficient documentation

## 2013-10-06 DIAGNOSIS — R519 Headache, unspecified: Secondary | ICD-10-CM

## 2013-10-06 DIAGNOSIS — J329 Chronic sinusitis, unspecified: Secondary | ICD-10-CM | POA: Insufficient documentation

## 2013-10-06 DIAGNOSIS — E119 Type 2 diabetes mellitus without complications: Secondary | ICD-10-CM | POA: Insufficient documentation

## 2013-10-06 DIAGNOSIS — R51 Headache: Secondary | ICD-10-CM

## 2013-10-06 DIAGNOSIS — Z8701 Personal history of pneumonia (recurrent): Secondary | ICD-10-CM | POA: Insufficient documentation

## 2013-10-06 DIAGNOSIS — F411 Generalized anxiety disorder: Secondary | ICD-10-CM | POA: Insufficient documentation

## 2013-10-06 DIAGNOSIS — K219 Gastro-esophageal reflux disease without esophagitis: Secondary | ICD-10-CM | POA: Insufficient documentation

## 2013-10-06 DIAGNOSIS — Z880 Allergy status to penicillin: Secondary | ICD-10-CM | POA: Insufficient documentation

## 2013-10-06 DIAGNOSIS — I10 Essential (primary) hypertension: Secondary | ICD-10-CM | POA: Insufficient documentation

## 2013-10-06 MED ORDER — HYDROCODONE-ACETAMINOPHEN 5-325 MG PO TABS: 1.0000 | ORAL_TABLET | Freq: Four times a day (QID) | ORAL | Status: DC | PRN

## 2013-10-06 MED ORDER — KETOROLAC TROMETHAMINE 30 MG/ML IJ SOLN
30.0000 mg | Freq: Once | INTRAMUSCULAR | Status: AC
  Administered 2013-10-06: 30 mg via INTRAVENOUS
  Filled 2013-10-06: qty 1

## 2013-10-06 MED ORDER — DIPHENHYDRAMINE HCL 50 MG/ML IJ SOLN
25.0000 mg | Freq: Once | INTRAMUSCULAR | Status: AC
  Administered 2013-10-06: 25 mg via INTRAVENOUS
  Filled 2013-10-06: qty 1

## 2013-10-06 MED ORDER — METOCLOPRAMIDE HCL 5 MG/ML IJ SOLN
10.0000 mg | Freq: Once | INTRAMUSCULAR | Status: AC
  Administered 2013-10-06: 10 mg via INTRAVENOUS
  Filled 2013-10-06: qty 2

## 2013-10-06 MED ORDER — SULFAMETHOXAZOLE-TMP DS 800-160 MG PO TABS: 1.0000 | ORAL_TABLET | Freq: Two times a day (BID) | ORAL | Status: DC

## 2013-10-06 NOTE — Discharge Instructions (Signed)
Follow up with your md if not improving. °

## 2013-10-06 NOTE — ED Provider Notes (Signed)
CSN: [id number]     Arrival date & time 10/06/13  1242 History   First MD Initiated Contact with Patient 10/06/13 1312     Chief Complaint  Patient presents with  . Headache     (Consider location/radiation/quality/duration/timing/severity/associated sxs/prior Treatment) Patient is a 43 y.o. female presenting with headaches. The history is provided by the patient (the pt complains of sinus congestion and a headache).  Headache Pain location:  Frontal Quality:  Dull Radiates to:  Does not radiate Severity currently:  7/10 Severity at highest:  9/10 Onset quality:  Sudden Timing:  Constant Progression:  Unchanged Chronicity:  New Associated symptoms: sinus pressure   Associated symptoms: no abdominal pain, no back pain, no congestion, no cough, no diarrhea, no fatigue and no seizures     Past Medical History  Diagnosis Date  . Anxiety   . GERD (gastroesophageal reflux disease)   . Insomnia   . Pneumonia   . Hypertension   . Diabetes   . Hyperlipidemia   . Pneumococcal pneumonia 2000   Past Surgical History  Procedure Laterality Date  . Cesarean section      x4  . Chest tube insertion    . Gastric bypass    . Dysplastic mole removed      right ankle  . Chest tubes  2000  . Recoonstruction of right lung    . Mini gastric bypass  2011  . Video bronchoscopy Bilateral 04/20/2013    Procedure: VIDEO BRONCHOSCOPY WITH FLUORO;  Surgeon: Elsie Stain, MD;  Location: [facility];  Service: Cardiopulmonary;  Laterality: Bilateral;   Family History  Problem Relation Age of Onset  . Colon cancer      grandmother  . Heart attack      grandfather  . Diabetes Father     father  . Hyperlipidemia    . Hypertension Mother   . Asthma Daughter    History  Substance Use Topics  . Smoking status: Never Smoker   . Smokeless tobacco: Never Used  . Alcohol Use: Yes     Comment: 1 glass per month   OB History   Grav Para Term Preterm Abortions TAB SAB Ect Mult Living              Review of Systems  Constitutional: Negative for appetite change and fatigue.  HENT: Positive for sinus pressure. Negative for congestion and ear discharge.   Eyes: Negative for discharge.  Respiratory: Negative for cough.   Cardiovascular: Negative for chest pain.  Gastrointestinal: Negative for abdominal pain and diarrhea.  Genitourinary: Negative for frequency and hematuria.  Musculoskeletal: Negative for back pain.  Skin: Negative for rash.  Neurological: Positive for headaches. Negative for seizures.  Psychiatric/Behavioral: Negative for hallucinations.      Allergies  Levaquin and Penicillins  Home Medications   Current Outpatient Rx  Name  Route  Sig  Dispense  Refill  . ALPRAZolam (XANAX) 1 MG tablet   Oral   Take 1 tablet (1 mg total) by mouth 3 (three) times daily as needed for sleep.   90 tablet   1   . BIOTIN PO   Oral   Take 1 capsule by mouth daily.         . carisoprodol (SOMA) 350 MG tablet   Oral   Take 1 tablet (350 mg total) by mouth 3 (three) times daily as needed for muscle spasms.   40 tablet   0   . ferrous sulfate 325 (65 FE)  MG tablet   Oral   Take 325 mg by mouth daily with breakfast.         . hydrochlorothiazide (HYDRODIURIL) 50 MG tablet   Oral   Take 1 tablet (50 mg total) by mouth daily.   30 tablet   2   . levocetirizine (XYZAL) 5 MG tablet   Oral   Take 1 tablet (5 mg total) by mouth every evening.   30 tablet   6   . Multiple Vitamin (MULTIVITAMIN) tablet   Oral   Take 1 tablet by mouth 3 (three) times daily.         [name] omeprazole (PRILOSEC) 40 MG capsule   Oral   Take 1 capsule (40 mg total) by mouth 2 (two) times daily before a meal.   60 capsule   6   . oxyCODONE-acetaminophen (PERCOCET) 10-325 MG per tablet   Oral   Take 1.5-2 tablets by mouth every 6 (six) hours as needed for pain.   45 tablet   0   . SUMAtriptan (IMITREX) 25 MG tablet   Oral   Take 1 tablet (25 mg total) by mouth every 2  (two) hours as needed for migraine.   10 tablet   0   . traZODone (DESYREL) 100 MG tablet      1-2 tabs at bedtime only as needed.   60 tablet   5   . Vitamin D, Ergocalciferol, (DRISDOL) 50000 UNITS CAPS capsule      TAKE ONE CAPSULE BY MOUTH EVERY WEEK   4 capsule   0   . HYDROcodone-acetaminophen (NORCO/VICODIN) 5-325 MG per tablet   Oral   Take 1 tablet by mouth every 6 (six) hours as needed for moderate pain.   20 tablet   0   . sulfamethoxazole-trimethoprim (BACTRIM DS) 800-160 MG per tablet   Oral   Take 1 tablet by mouth 2 (two) times daily.   28 tablet   0    BP 135/74  Pulse 68  Temp(Src) 98.3 F (36.8 C) (Oral)  Resp 18  Ht 5\' 6"  (1.676 m)  Wt 182 lb (82.555 kg)  BMI 29.39 kg/m2  SpO2 100%  LMP 09/15/2013 Physical Exam  Constitutional: She is oriented to person, place, and time. She appears well-developed.  HENT:  Head: Normocephalic.  Tender frontal sinuses  Eyes: Conjunctivae and EOM are normal. No scleral icterus.  Neck: Neck supple. No thyromegaly present.  Cardiovascular: Normal rate and regular rhythm.  Exam reveals no gallop and no friction rub.   No murmur heard. Pulmonary/Chest: No stridor. She has no wheezes. She has no rales. She exhibits no tenderness.  Abdominal: She exhibits no distension. There is no tenderness. There is no rebound.  Musculoskeletal: Normal range of motion. She exhibits no edema.  Lymphadenopathy:    She has no cervical adenopathy.  Neurological: She is oriented to person, place, and time. She exhibits normal muscle tone. Coordination normal.  Skin: No rash noted. No erythema.  Psychiatric: She has a normal mood and affect. Her behavior is normal.    ED Course  Procedures (including critical care time) Labs Review Labs Reviewed - No data to display Imaging Review No results found.  EKG Interpretation   None       MDM   Final diagnoses:  Headache  Sinusitis        Maudry Diego, MD 10/06/13  [phone]

## 2013-10-06 NOTE — ED Notes (Signed)
Pt c/o ha since yesterday with n/v.

## 2013-10-30 ENCOUNTER — Encounter: Payer: Self-pay | Admitting: Emergency Medicine

## 2013-10-30 ENCOUNTER — Emergency Department (INDEPENDENT_AMBULATORY_CARE_PROVIDER_SITE_OTHER)
Admission: EM | Admit: 2013-10-30 | Discharge: 2013-10-30 | Disposition: A | Discharge status: Home / Self Care | Payer: 59 | Source: Home / Self Care | Attending: Family Medicine | Admitting: Family Medicine

## 2013-10-30 DIAGNOSIS — J069 Acute upper respiratory infection, unspecified: Secondary | ICD-10-CM

## 2013-10-30 MED ORDER — AZITHROMYCIN 250 MG PO TABS: ORAL_TABLET | ORAL | Status: DC

## 2013-10-30 MED ORDER — BENZONATATE 200 MG PO CAPS
200.0000 mg | ORAL_CAPSULE | Freq: Every day | ORAL | Status: DC
Start: 2013-10-30 — End: 2014-01-26

## 2013-10-30 NOTE — ED Notes (Signed)
Kimberly Wade complains of chills, body aches, burning in both eyes, fullness in both ears, right ear pain, sore throat, runny nose, sneezing, productive cough with yellow/brown sputum and hoarseness for 3 days.

## 2013-10-30 NOTE — ED Provider Notes (Signed)
CSN: [id number]     Arrival date & time 10/30/13  1156 History   First MD Initiated Contact with Patient 10/30/13 1242     Chief Complaint  Patient presents with  . Cough    x 3 days  . Facial Pain    x 3 days  . Ear Fullness    x 3 days      HPI Comments: Three days ago patient developed a sore throat, hoarseness, and fatigue.  Yesterday her symptoms were worse and she developed a headache, sinus congestion, and soreness in her neck.  Last night she developed a productive cough and right earache.  She has had shortness of breath and chills. She has a past history of pneumonia.  The history is provided by the patient.    Past Medical History  Diagnosis Date  . Anxiety   . GERD (gastroesophageal reflux disease)   . Insomnia   . Pneumonia   . Hypertension   . Diabetes   . Hyperlipidemia   . Pneumococcal pneumonia 2000   Past Surgical History  Procedure Laterality Date  . Cesarean section      x4  . Chest tube insertion    . Gastric bypass    . Dysplastic mole removed      right ankle  . Chest tubes  2000  . Recoonstruction of right lung    . Mini gastric bypass  2011  . Video bronchoscopy Bilateral 04/20/2013    Procedure: VIDEO BRONCHOSCOPY WITH FLUORO;  Surgeon: Elsie Stain, MD;  Location: [facility];  Service: Cardiopulmonary;  Laterality: Bilateral;   Family History  Problem Relation Age of Onset  . Colon cancer      grandmother  . Heart attack      grandfather  . Diabetes Father     father  . Hypertension Father   . Hyperlipidemia    . Hypertension Mother   . Asthma Daughter    History  Substance Use Topics  . Smoking status: Never Smoker   . Smokeless tobacco: Never Used  . Alcohol Use: Yes     Comment: 1 glass per month   OB History   Grav Para Term Preterm Abortions TAB SAB Ect Mult Living                 Review of Systems + sore throat + cough + hoarseness No pleuritic pain No wheezing + nasal congestion + post-nasal drainage No  sinus pain/pressure No itchy/red eyes ? earache No hemoptysis + SOB No fever, + chills No nausea No vomiting No abdominal pain No diarrhea No urinary symptoms No skin rash + fatigue +  myalgias + headache Used OTC meds without relief  Allergies  Levaquin and Penicillins  Home Medications   Current Outpatient Rx  Name  Route  Sig  Dispense  Refill  . ALPRAZolam (XANAX) 1 MG tablet   Oral   Take 1 tablet (1 mg total) by mouth 3 (three) times daily as needed for sleep.   90 tablet   1   . BIOTIN PO   Oral   Take 1 capsule by mouth daily.         . carisoprodol (SOMA) 350 MG tablet   Oral   Take 1 tablet (350 mg total) by mouth 3 (three) times daily as needed for muscle spasms.   40 tablet   0   . ferrous sulfate 325 (65 FE) MG tablet   Oral   Take 325 mg  by mouth daily with breakfast.         . hydrochlorothiazide (HYDRODIURIL) 50 MG tablet   Oral   Take 1 tablet (50 mg total) by mouth daily.   30 tablet   2   . HYDROcodone-acetaminophen (NORCO/VICODIN) 5-325 MG per tablet   Oral   Take 1 tablet by mouth every 6 (six) hours as needed for moderate pain.   20 tablet   0   . levocetirizine (XYZAL) 5 MG tablet   Oral   Take 1 tablet (5 mg total) by mouth every evening.   30 tablet   6   . Multiple Vitamin (MULTIVITAMIN) tablet   Oral   Take 1 tablet by mouth 3 (three) times daily.         [name] omeprazole (PRILOSEC) 40 MG capsule   Oral   Take 1 capsule (40 mg total) by mouth 2 (two) times daily before a meal.   60 capsule   6   . oxyCODONE-acetaminophen (PERCOCET) 10-325 MG per tablet   Oral   Take 1.5-2 tablets by mouth every 6 (six) hours as needed for pain.   45 tablet   0   . sulfamethoxazole-trimethoprim (BACTRIM DS) 800-160 MG per tablet   Oral   Take 1 tablet by mouth 2 (two) times daily.   28 tablet   0   . SUMAtriptan (IMITREX) 25 MG tablet   Oral   Take 1 tablet (25 mg total) by mouth every 2 (two) hours as needed for  migraine.   10 tablet   0   . traZODone (DESYREL) 100 MG tablet      1-2 tabs at bedtime only as needed.   60 tablet   5   . Vitamin D, Ergocalciferol, (DRISDOL) 50000 UNITS CAPS capsule      TAKE ONE CAPSULE BY MOUTH EVERY WEEK   4 capsule   0   . azithromycin (ZITHROMAX Z-PAK) 250 MG tablet      Take 2 tabs today; then begin one tab once daily for 4 more days.   6 each   0   . benzonatate (TESSALON) 200 MG capsule   Oral   Take 1 capsule (200 mg total) by mouth at bedtime. Take as needed for cough   12 capsule   0    BP 122/76  Pulse 84  Temp(Src) 98.4 F (36.9 C) (Oral)  Ht 5\' 5"  (1.651 m)  Wt 185 lb (83.915 kg)  BMI 30.79 kg/m2  SpO2 98%  LMP 10/15/2013 Physical Exam Nursing notes and Vital Signs reviewed. Appearance:  Patient appears stated age, and in no acute distress Eyes:  Pupils are equal, round, and reactive to light and accomodation.  Extraocular movement is intact.  Conjunctivae are not inflamed  Ears:  Canals normal.  Tympanic membranes normal.  Nose:  Mildly congested turbinates.  No sinus tenderness.   Pharynx:  Normal Neck:  Supple.  Tender enlarged posterior nodes are palpated bilaterally  Lungs:  Clear to auscultation.  Breath sounds are equal.  Heart:  Regular rate and rhythm without murmurs, rubs, or gallops.  Abdomen:  Nontender without masses or hepatosplenomegaly.  Bowel sounds are present.  No CVA or flank tenderness.  Extremities:  No edema.  No calf tenderness Skin:  No rash present.   ED Course  Procedures  none      MDM   1. Acute upper respiratory infections of unspecified site; suspect early viral URI    With a past history  of recurrent pneumonia, will begin Z-pack.  Prescription written for Benzonatate [facility]) to take at bedtime for night-time cough.  Take plain Mucinex (1200 mg guaifenesin) twice daily for cough and congestion.  May add Sudafed for sinus congestion.   Increase fluid intake, rest. May use Afrin nasal  spray (or generic oxymetazoline) twice daily for about 5 days.  Also recommend using saline nasal spray several times daily and saline nasal irrigation (AYR is a common brand) Try warm salt water gargles for sore throat.  Stop all antihistamines for now, and other non-prescription cough/cold preparations. May take Ibuprofen 200mg , 4 tabs every 8 hours with food for headache, fever, etc.   Follow-up with family doctor if not improving 7 to 10 days.     Kandra Nicolas, MD 10/30/13 [phone]

## 2013-10-30 NOTE — Discharge Instructions (Signed)
Take plain Mucinex (1200 mg guaifenesin) twice daily for cough and congestion.  May add Sudafed for sinus congestion.   Increase fluid intake, rest. May use Afrin nasal spray (or generic oxymetazoline) twice daily for about 5 days.  Also recommend using saline nasal spray several times daily and saline nasal irrigation (AYR is a common brand) Try warm salt water gargles for sore throat.  Stop all antihistamines for now, and other non-prescription cough/cold preparations. May take Ibuprofen 200mg , 4 tabs every 8 hours with food for headache, fever, etc.   Follow-up with family doctor if not improving 7 to 10 days.

## 2014-01-23 ENCOUNTER — Telehealth: Payer: Self-pay | Admitting: *Deleted

## 2014-01-23 DIAGNOSIS — F411 Generalized anxiety disorder: Secondary | ICD-10-CM

## 2014-01-23 MED ORDER — ALPRAZOLAM 1 MG PO TABS: 1.0000 mg | ORAL_TABLET | Freq: Three times a day (TID) | ORAL | Status: DC | PRN

## 2014-01-23 NOTE — Telephone Encounter (Signed)
Pt called and request rx for xanax. Will send over one month. Called and spoke with patient and asked that she schedule a f/u appt for her anxiety since its been a little while since she has been seen for this issue. Pt agreed and was transferred to schedule an appt

## 2014-01-26 ENCOUNTER — Encounter: Payer: Self-pay | Admitting: Family Medicine

## 2014-01-26 ENCOUNTER — Ambulatory Visit (INDEPENDENT_AMBULATORY_CARE_PROVIDER_SITE_OTHER): Payer: 59 | Admitting: Family Medicine

## 2014-01-26 VITALS — BP 123/71 | HR 70 | Wt 181.0 lb

## 2014-01-26 DIAGNOSIS — M25562 Pain in left knee: Secondary | ICD-10-CM

## 2014-01-26 DIAGNOSIS — T148XXA Other injury of unspecified body region, initial encounter: Secondary | ICD-10-CM

## 2014-01-26 DIAGNOSIS — G43909 Migraine, unspecified, not intractable, without status migrainosus: Secondary | ICD-10-CM

## 2014-01-26 DIAGNOSIS — M542 Cervicalgia: Secondary | ICD-10-CM

## 2014-01-26 DIAGNOSIS — S161XXA Strain of muscle, fascia and tendon at neck level, initial encounter: Secondary | ICD-10-CM

## 2014-01-26 DIAGNOSIS — S139XXA Sprain of joints and ligaments of unspecified parts of neck, initial encounter: Secondary | ICD-10-CM

## 2014-01-26 MED ORDER — KETOROLAC TROMETHAMINE 60 MG/2ML IM SOLN
60.0000 mg | Freq: Once | INTRAMUSCULAR | Status: AC
  Administered 2014-01-26: 60 mg via INTRAMUSCULAR

## 2014-01-26 MED ORDER — HYDROCODONE-ACETAMINOPHEN 5-325 MG PO TABS: 1.0000 | ORAL_TABLET | Freq: Four times a day (QID) | ORAL | Status: DC | PRN

## 2014-01-26 MED ORDER — PROMETHAZINE HCL 25 MG/ML IJ SOLN
25.0000 mg | Freq: Once | INTRAMUSCULAR | Status: AC
  Administered 2014-01-26: 25 mg via INTRAMUSCULAR

## 2014-01-26 MED ORDER — CARISOPRODOL 350 MG PO TABS: 350.0000 mg | ORAL_TABLET | Freq: Three times a day (TID) | ORAL | Status: DC | PRN

## 2014-01-26 NOTE — Progress Notes (Signed)
   Subjective:    Patient ID: Kimberly Wade, female    DOB: 08-01-1971, 43 y.o.   MRN: [id number]  HPI Started today with pain in the left side of neck and down to her right shoulder.  She has been moving boxes and lifting things. They recently moved to a new home and she has been unpacking things and moving furniture around. No known acute injury. Now feels like has a migraine.  Used tylenol this AM No ice or heat.  No prior neck or shoulder problem.  It is worse to turn her head to the side. Her pain started to get worse she called the office for an appointment. She feels like it's now triggering her migraine. Rates her pain as severe and constant.  No alleviating factors. Rates her pain a 5/10. No GI symptoms. No sore throat.   Review of Systems     Objective:   Physical Exam  Constitutional: She is oriented to person, place, and time. She appears well-developed and well-nourished.  HENT:  Head: Normocephalic and atraumatic.  Eyes: Conjunctivae are normal. Pupils are equal, round, and reactive to light.  Musculoskeletal:  Neck with decreased flexion and decreased extension. Decreased rotation left and right but symmetric. Decreased side bending to the right compared to the left. Nontender of the cervical spine. Nontender at the occiput or paraspinous muscles of the cervical area. She is very tender over the sternocleidomastoid and over the muscle tissue superior to the clavicle. Shoulders with normal range of motion and strength 5 out of 5 in the upper remedies.  Neurological: She is alert and oriented to person, place, and time.  Skin: Skin is warm and dry.  Psychiatric: She has a normal mood and affect. Her behavior is normal.          Assessment & Plan:  Right neck spasm-recommend anti-inflammatory. We'll send her a prescription for ibuprofen 600 mg to local pharmacy. We'll also refill soma which she has used in the past for other muscular skeletal injuries that she felt was very  helpful. Also give her a small quantity of hydrocodone to use at bedtime as needed for more severe pain. Handout provided to start on some channel exercises to stretch out the muscle tissue. Recommend alternating heat and ice. If not improving in the next 2-3 weeks then please let us know.  Migraine headache-will treat with Toradol and Phenergan injection today. Her daughter is here with her today and is driving her home.

## 2014-01-26 NOTE — Patient Instructions (Signed)
If not improving over the next 2-3 weeks then please followup for further evaluation and treatment.

## 2014-01-31 ENCOUNTER — Encounter: Payer: 59 | Admitting: Family Medicine

## 2014-01-31 ENCOUNTER — Telehealth: Payer: Self-pay | Admitting: Family Medicine

## 2014-01-31 DIAGNOSIS — Z0289 Encounter for other administrative examinations: Secondary | ICD-10-CM

## 2014-01-31 NOTE — Telephone Encounter (Signed)
Kimberly Wade, Can you please notify the patient to follow up at her convenience for her missed CPE today.   Patient has been deemed a "no-show" for today's scheduled appointment.  Based on chief complaint and my chart review: -Follow-up advised.  Contacting patient and urged to schedule visit in a few weeks.  If necessary this was sent to my support staff to be communicated to the patient via phone or mail on: 5:12 PM today.

## 2014-02-01 NOTE — Telephone Encounter (Signed)
Dr Ileene Rubens, I spoke with patient today. She will check her schedule and call us back to reschedule her CPE.

## 2014-02-02 ENCOUNTER — Ambulatory Visit (INDEPENDENT_AMBULATORY_CARE_PROVIDER_SITE_OTHER): Payer: 59 | Admitting: Sports Medicine

## 2014-02-02 ENCOUNTER — Ambulatory Visit (INDEPENDENT_AMBULATORY_CARE_PROVIDER_SITE_OTHER): Payer: 59

## 2014-02-02 ENCOUNTER — Encounter: Payer: Self-pay | Admitting: Sports Medicine

## 2014-02-02 VITALS — BP 135/84 | HR 78 | Ht 66.0 in | Wt 186.0 lb

## 2014-02-02 DIAGNOSIS — M79609 Pain in unspecified limb: Secondary | ICD-10-CM

## 2014-02-02 DIAGNOSIS — M19041 Primary osteoarthritis, right hand: Secondary | ICD-10-CM | POA: Insufficient documentation

## 2014-02-02 DIAGNOSIS — M79641 Pain in right hand: Secondary | ICD-10-CM

## 2014-02-02 MED ORDER — OXYCODONE-ACETAMINOPHEN 5-325 MG PO TABS: 1.0000 | ORAL_TABLET | Freq: Three times a day (TID) | ORAL | Status: DC | PRN

## 2014-02-02 NOTE — Assessment & Plan Note (Addendum)
After using a weed eater yesterday. This likely represents a third flexor tendon synovitis. X-rays to ensure no fractures, return, we can do a third flexor tendon sheath ultrasound guided injection.  X-rays were negative, medication placed in the third flexor tendon sheath and the third metacarpophalangeal joint. Percocet for pain

## 2014-02-02 NOTE — Progress Notes (Signed)
   Subjective:    I'm seeing this patient as a consultation for:  Dr. Ileene Rubens  CC: Right hand pain  HPI: This is an extremely pleasant 43 year old female who was using a weed whacker yesterday, after she used it she developed immediate pain, intense, and the palm of the right hand reading from the third digit to the wrist. It is swollen, pain is severe, persistent. No paresthesias, no constitutional symptoms, no trauma.  Past medical history, Surgical history, Family history not pertinant except as noted below, Social history, Allergies, and medications have been entered into the medical record, reviewed, and no changes needed.   Review of Systems: No headache, visual changes, nausea, vomiting, diarrhea, constipation, dizziness, abdominal pain, skin rash, fevers, chills, night sweats, weight loss, swollen lymph nodes, body aches, joint swelling, muscle aches, chest pain, shortness of breath, mood changes, visual or auditory hallucinations.   Objective:   General: Well Developed, well nourished, and in no acute distress.  Neuro/Psych: Alert and oriented x3, extra-ocular muscles intact, able to move all 4 extremities, sensation grossly intact. Skin: Warm and dry, no rashes noted.  Respiratory: Not using accessory muscles, speaking in full sentences, trachea midline.  Cardiovascular: Pulses palpable, no extremity edema. Abdomen: Does not appear distended. Right hand: Exquisitely painful along the course of the flexor digitorum tendons of the third digit, swollen, no palpable warmth, or induration. There is significant pain with passive motion of the third digit.  X-rays were reviewed and are negative for fracture.  Procedure: Real-time Ultrasound Guided Injection of right third flexor tendon sheath Device: GE Logiq E  Verbal informed consent obtained.  Time-out conducted.  Noted no overlying erythema, induration, or other signs of local infection.  Skin prepped in a sterile fashion.  Local  anesthesia: Topical Ethyl chloride.  With sterile technique and under real time ultrasound guidance: 25-gauge needle advanced into the tendon sheath, 0.5 cc Kenalog 40, 2 cc lidocaine injected easily.  Completed without difficulty  Pain immediately resolved suggesting accurate placement of the medication.  Advised to call if fevers/chills, erythema, induration, drainage, or persistent bleeding.  Images permanently stored and available for review in the ultrasound unit.  Impression: Technically successful ultrasound guided injection.  Procedure: Real-time Ultrasound Guided Injection of right 3rd MCP Device: GE Logiq E  Verbal informed consent obtained.  Time-out conducted.  Noted no overlying erythema, induration, or other signs of local infection.  Skin prepped in a sterile fashion.  Local anesthesia: Topical Ethyl chloride.  With sterile technique and under real time ultrasound guidance:  Needle then redirected from the flexor tendon sheath, and 0.5 cc Kenalog 40, 0.5 cc lidocaine injected into the third metacarpophalangeal joint. Completed without difficulty  Pain immediately resolved suggesting accurate placement of the medication.  Advised to call if fevers/chills, erythema, induration, drainage, or persistent bleeding.  Images permanently stored and available for review in the ultrasound unit.  Impression: Technically successful ultrasound guided injection.  Impression and Recommendations:   This case required medical decision making of moderate complexity.

## 2014-02-21 ENCOUNTER — Other Ambulatory Visit: Payer: Self-pay | Admitting: Family Medicine

## 2014-02-22 ENCOUNTER — Ambulatory Visit: Payer: 59 | Admitting: Sports Medicine

## 2014-02-22 ENCOUNTER — Encounter: Payer: 59 | Admitting: Family Medicine

## 2014-03-04 IMAGING — CR DG CHEST 2V
2 series · 2 of 2 positions shown · non-contrast
Comparison: 04/05/2013 and CT chest 03/30/2013.

CLINICAL DATA: Right upper lobe pneumonia, follow up.

CHEST - 2 VIEW

[w chest pa]
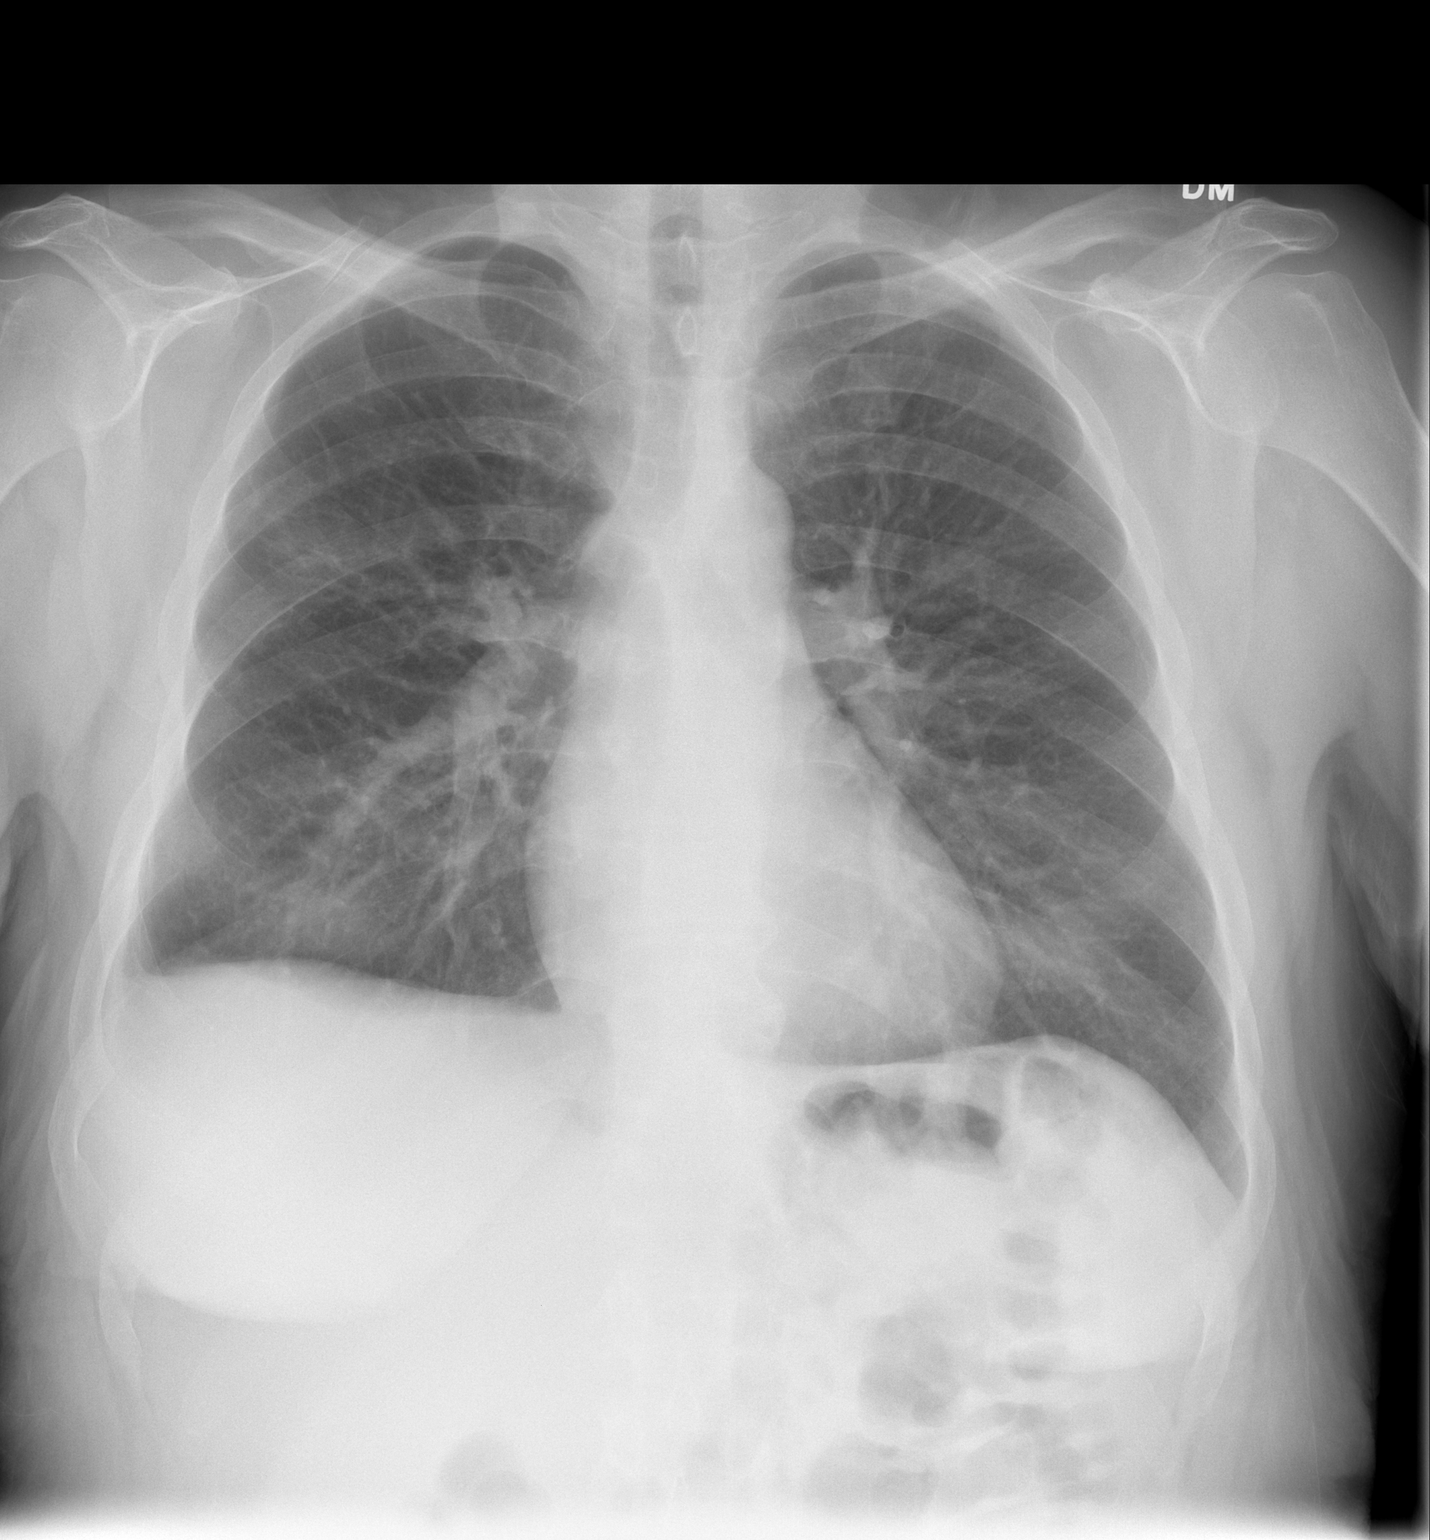

[w chest lat]
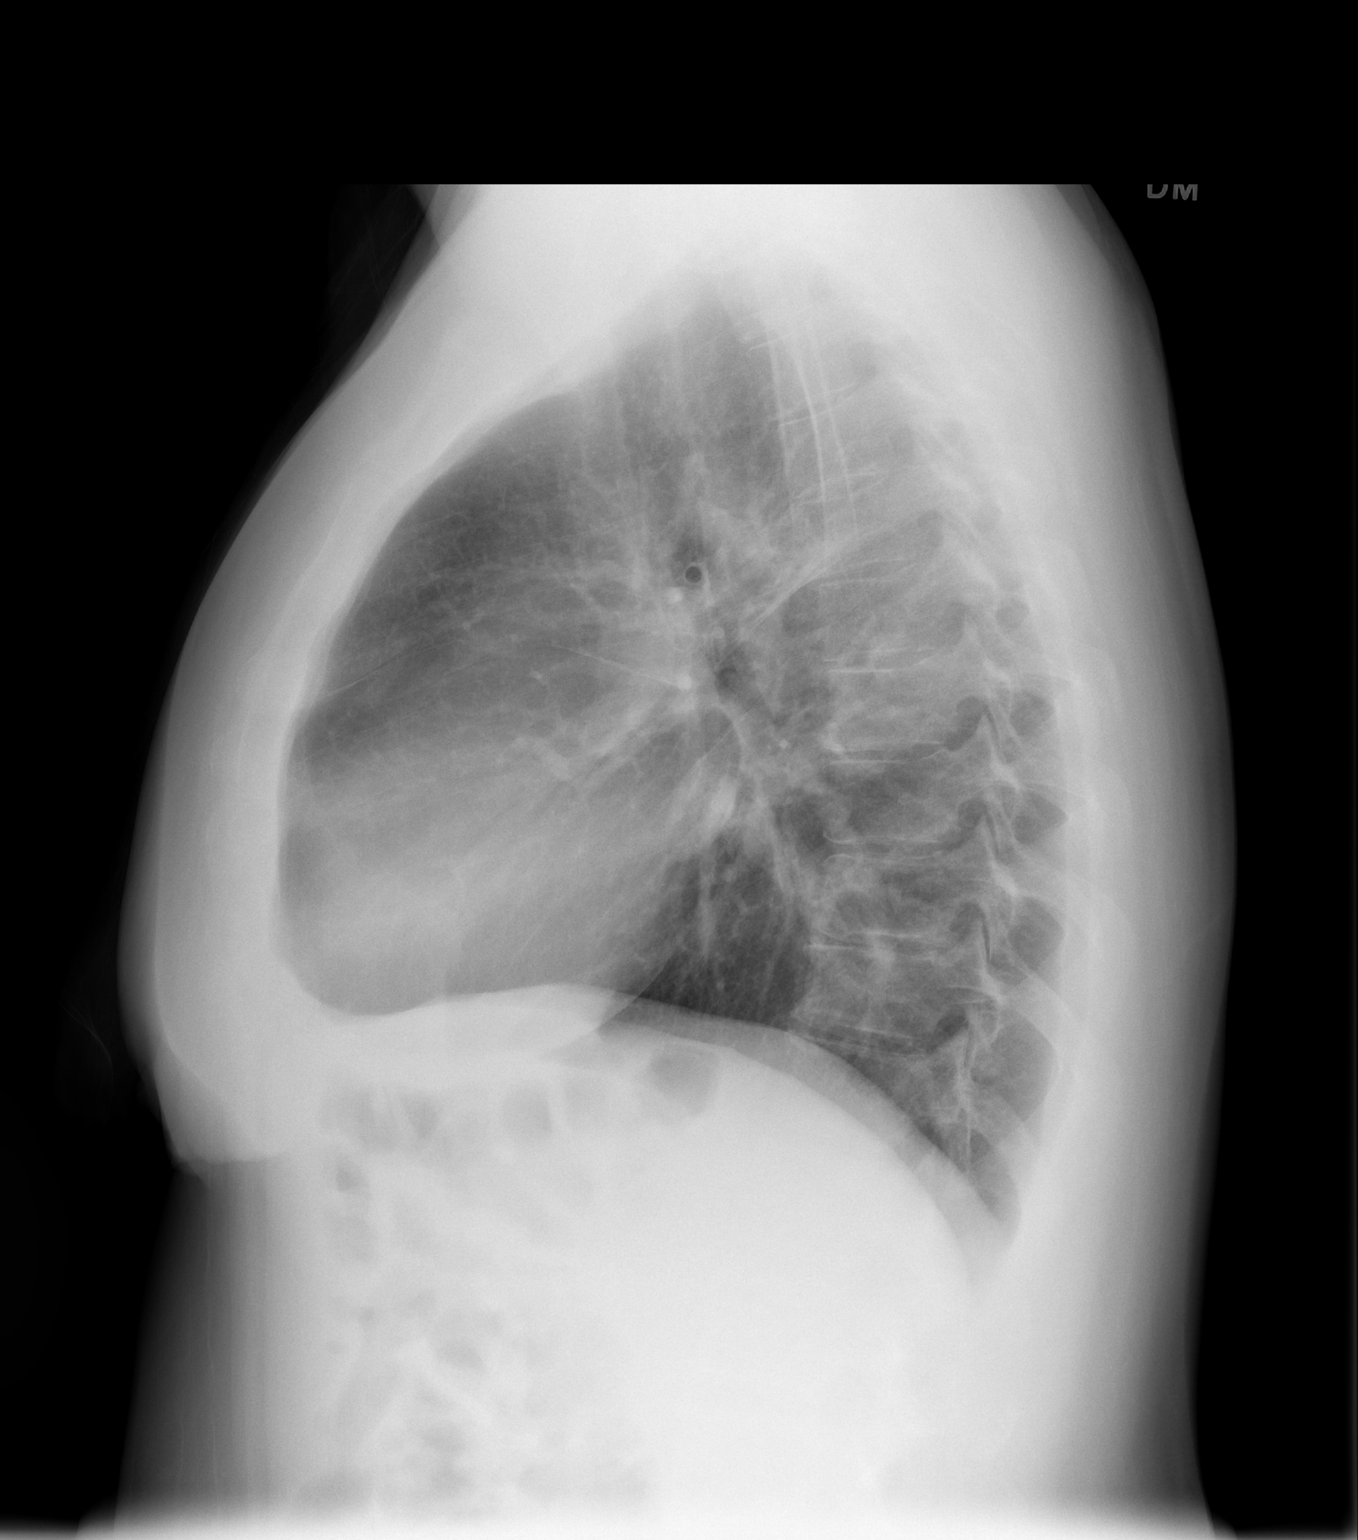

[2 of 2 positions shown; findings below may reference images not displayed]

FINDINGS: Trachea is midline.  Heart size normal.  Near complete
resolution of right upper lobe pneumonia with linear density in its
place.  Blunting of the right costophrenic angle appears chronic.
Lungs are otherwise clear.  No pleural fluid.
IMPRESSION: Near complete resolution of right upper lobe pneumonia with
probable residual scarring.

## 2014-03-13 ENCOUNTER — Encounter: Payer: 59 | Admitting: Family Medicine

## 2014-03-16 ENCOUNTER — Other Ambulatory Visit: Payer: Self-pay | Admitting: Family Medicine

## 2014-04-04 ENCOUNTER — Ambulatory Visit (INDEPENDENT_AMBULATORY_CARE_PROVIDER_SITE_OTHER): Payer: 59 | Admitting: Family Medicine

## 2014-04-04 ENCOUNTER — Encounter: Payer: Self-pay | Admitting: Family Medicine

## 2014-04-04 VITALS — BP 146/96 | HR 68 | Ht 66.0 in | Wt 188.0 lb

## 2014-04-04 DIAGNOSIS — Z309 Encounter for contraceptive management, unspecified: Secondary | ICD-10-CM

## 2014-04-04 DIAGNOSIS — E119 Type 2 diabetes mellitus without complications: Secondary | ICD-10-CM

## 2014-04-04 DIAGNOSIS — Z23 Encounter for immunization: Secondary | ICD-10-CM

## 2014-04-04 DIAGNOSIS — Z0000 Encounter for general adult medical examination without abnormal findings: Secondary | ICD-10-CM

## 2014-04-04 DIAGNOSIS — E785 Hyperlipidemia, unspecified: Secondary | ICD-10-CM

## 2014-04-04 DIAGNOSIS — Z1231 Encounter for screening mammogram for malignant neoplasm of breast: Secondary | ICD-10-CM

## 2014-04-04 DIAGNOSIS — E559 Vitamin D deficiency, unspecified: Secondary | ICD-10-CM

## 2014-04-04 DIAGNOSIS — Z2839 Other underimmunization status: Secondary | ICD-10-CM

## 2014-04-04 DIAGNOSIS — D538 Other specified nutritional anemias: Secondary | ICD-10-CM

## 2014-04-04 DIAGNOSIS — Z283 Underimmunization status: Secondary | ICD-10-CM

## 2014-04-04 DIAGNOSIS — F411 Generalized anxiety disorder: Secondary | ICD-10-CM

## 2014-04-04 MED ORDER — OMEPRAZOLE 40 MG PO CPDR: 40.0000 mg | DELAYED_RELEASE_CAPSULE | Freq: Two times a day (BID) | ORAL | Status: DC

## 2014-04-04 MED ORDER — VITAMIN D (ERGOCALCIFEROL) 1.25 MG (50000 UNIT) PO CAPS: 50000.0000 [IU] | ORAL_CAPSULE | ORAL | Status: DC

## 2014-04-04 MED ORDER — ALPRAZOLAM 1 MG PO TABS: 1.0000 mg | ORAL_TABLET | Freq: Three times a day (TID) | ORAL | Status: DC | PRN

## 2014-04-04 MED ORDER — TRAZODONE HCL 100 MG PO TABS: ORAL_TABLET | ORAL | Status: DC

## 2014-04-04 MED ORDER — LISINOPRIL-HYDROCHLOROTHIAZIDE 20-12.5 MG PO TABS: 1.0000 | ORAL_TABLET | Freq: Every day | ORAL | Status: DC

## 2014-04-04 MED ORDER — HYDROCODONE-ACETAMINOPHEN 5-325 MG PO TABS: 1.0000 | ORAL_TABLET | Freq: Four times a day (QID) | ORAL | Status: DC | PRN

## 2014-04-04 MED ORDER — LEVOCETIRIZINE DIHYDROCHLORIDE 5 MG PO TABS: 5.0000 mg | ORAL_TABLET | Freq: Every evening | ORAL | Status: DC

## 2014-04-04 NOTE — Progress Notes (Signed)
CC: Kimberly Wade is a 43 y.o. female is here for Annual Exam   Subjective: HPI:  Colonoscopy: no increased risk of colon cancer, will begin screening age 32 Papsmear: > than five years since last, she prefers referral to GYN for Pap and removal of Mirena Mammogram: Over one year since her last mammogram, she would prefer to continue screening  Influenza Vaccine: Will receive vaccine today Pneumovax: Will receive Prevnar today Td/Tdap: Declines tetanus booster Zoster: (Start 43 yo)  Rare alcohol use, no tobacco use or rec drug use.  No formal exercise regimen.  Acute complaint of headache that has been present for the past 2 weeks a daily basis first thing in the morning the only intervention it seems to provide some relief is taking a hydrocodone denies any motor or sensory disturbances recently remotely. Localized in the forehead, persistent, nonradiating without any other accompanying symptoms.  Requesting refills on all medications  She's asking if we can get her bariatric labs at the same time of her lipid panel, metabolic panel, and CBC.  Review of Systems - General ROS: negative for - chills, fever, night sweats, weight gain or weight loss Ophthalmic ROS: negative for - decreased vision Psychological ROS: negative for - uncontrolled anxiety or depression ENT ROS: negative for - hearing change, nasal congestion, tinnitus or allergies Hematological and Lymphatic ROS: negative for - bleeding problems, bruising or swollen lymph nodes Breast ROS: negative Respiratory ROS: no cough, shortness of breath, or wheezing Cardiovascular ROS: no chest pain or dyspnea on exertion Gastrointestinal ROS: no abdominal pain, change in bowel habits, or black or bloody stools Genito-Urinary ROS: negative for - genital discharge, genital ulcers, incontinence or abnormal bleeding from genitals Musculoskeletal ROS: negative for - joint pain or muscle pain Neurological ROS: negative for - memory  loss Dermatological ROS: negative for lumps, mole changes, rash and skin lesion changes  Past Medical History  Diagnosis Date  . Anxiety   . GERD (gastroesophageal reflux disease)   . Insomnia   . Pneumonia   . Hypertension   . Diabetes   . Hyperlipidemia   . Pneumococcal pneumonia 2000    Past Surgical History  Procedure Laterality Date  . Cesarean section      x4  . Chest tube insertion    . Gastric bypass    . Dysplastic mole removed      right ankle  . Chest tubes  2000  . Recoonstruction of right lung    . Mini gastric bypass  2011  . Video bronchoscopy Bilateral 04/20/2013    Procedure: VIDEO BRONCHOSCOPY WITH FLUORO;  Surgeon: Elsie Stain, MD;  Location: [facility];  Service: Cardiopulmonary;  Laterality: Bilateral;   Family History  Problem Relation Age of Onset  . Colon cancer      grandmother  . Heart attack      grandfather  . Diabetes Father     father  . Hypertension Father   . Hyperlipidemia    . Hypertension Mother   . Asthma Daughter     History   Social History  . Marital Status: Legally Separated    Spouse Name: N/A    Number of Children: N/A  . Years of Education: N/A   Occupational History  . Advertising Accountant    Social History Main Topics  . Smoking status: Never Smoker   . Smokeless tobacco: Never Used  . Alcohol Use: Yes     Comment: 1 glass per month  . Drug Use: No  .  Sexual Activity: Not on file   Other Topics Concern  . Not on file   Social History Narrative  . No narrative on file     Objective: BP 146/96  Pulse 68  Ht 5\' 6"  (1.676 m)  Wt 188 lb (85.276 kg)  BMI 30.36 kg/m2  General: No Acute Distress HEENT: Atraumatic, normocephalic, conjunctivae normal without scleral icterus.  No nasal discharge, hearing grossly intact, TMs with good landmarks bilaterally with no middle ear abnormalities, posterior pharynx clear without oral lesions. Neck: Supple, trachea midline, no cervical nor supraclavicular  adenopathy. Pulmonary: Clear to auscultation bilaterally without wheezing, rhonchi, nor rales. Cardiac: Regular rate and rhythm.  No murmurs, rubs, nor gallops. No peripheral edema.  2+ peripheral pulses bilaterally. Abdomen: Bowel sounds normal.  No masses.  Non-tender without rebound.  Negative Murphy's sign. GU deferred to upcoming GYN appointment  MSK: Grossly intact, no signs of weakness.  Full strength throughout upper and lower extremities.  Full ROM in upper and lower extremities.  No midline spinal tenderness. Neuro: Gait unremarkable, CN II-XII grossly intact.  C5-C6 Reflex 2/4 Bilaterally, L4 Reflex 2/4 Bilaterally.  Cerebellar function intact. Skin: No rashes. Noninflamed seborrheic keratosis on the back below the left scapula on the bra line Psych: Alert and oriented to person/place/time.  Thought process normal. No anxiety/depression.   Assessment & Plan: Kimberly Wade was seen today for annual exam.  Diagnoses and associated orders for this visit:  Annual physical exam  Hyperlipidemia - Lipid panel  Type 2 diabetes mellitus without complication - COMPLETE METABOLIC PANEL WITH GFR  Vitamin D deficiency - Vit D  25 hydroxy (rtn osteoporosis monitoring)  Other specified nutritional anemias - CBC - Ferritin - Prealbumin - Vitamin B1 - Vitamin B12 - Folate  Encounter for screening mammogram for breast cancer - MM DIGITAL SCREENING BILATERAL; Future  Encounter for contraceptive management, unspecified encounter - Ambulatory referral to Obstetrics / Gynecology  Generalized anxiety disorder - ALPRAZolam (XANAX) 1 MG tablet; Take 1 tablet (1 mg total) by mouth 3 (three) times daily as needed for sleep.  Immunization deficiency  Other Orders - lisinopril-hydrochlorothiazide (ZESTORETIC) 20-12.5 MG per tablet; Take 1 tablet by mouth daily. - levocetirizine (XYZAL) 5 MG tablet; Take 1 tablet (5 mg total) by mouth every evening. - omeprazole (PRILOSEC) 40 MG capsule;  Take 1 capsule (40 mg total) by mouth 2 (two) times daily before a meal. - Vitamin D, Ergocalciferol, (DRISDOL) 50000 UNITS CAPS capsule; Take 1 capsule (50,000 Units total) by mouth every 7 (seven) days. - traZODone (DESYREL) 100 MG tablet; TAKE 1-2 TABS AT BEDTIME ONLY AS NEEDED. - HYDROcodone-acetaminophen (NORCO/VICODIN) 5-325 MG per tablet; Take 1 tablet by mouth every 6 (six) hours as needed for moderate pain.    Healthy lifestyle interventions including but not limited to regular exercise, a healthy low fat diet, moderation of salt intake, the dangers of tobacco/alcohol/recreational drug use, nutrition supplementation, and accident avoidance were discussed with the patient and a handout was provided for future reference.  Repeat bariatric labs were present off to be obtained at her convenience along with her fasting sugar and lipid panel. Will need to remember to let Dr. Volanda Napoleon know  once these results are available in care everywhere.  Blood pressure is not under control, begin lisinopril-hydrochlorothiazide in hopes that this will help with her headaches. She's requesting a small amount of hydrocodone, provided with 20 pills with no further refills, we discussed rebound phenomenon of headaches with narcotic use.  Anxiety is controlled with as  needed use of Xanax.   Return in about 4 weeks (around 05/02/2014) for Headache / Blood Pressure Check.

## 2014-04-04 NOTE — Patient Instructions (Signed)
Dr. Albert Hersch's General Advice Following Your Complete Physical Exam  The Benefits of Regular Exercise: Unless you suffer from an uncontrolled cardiovascular condition, studies strongly suggest that regular exercise and physical activity will add to both the quality and length of your life.  The World Health Organization recommends 150 minutes of moderate intensity aerobic activity every week.  This is best split over 3-4 days a week, and can be as simple as a brisk walk for just over 35 minutes "most days of the week".  This type of exercise has been shown to lower LDL-Cholesterol, lower average blood sugars, lower blood pressure, lower cardiovascular disease risk, improve memory, and increase one's overall sense of wellbeing.  The addition of anaerobic (or "strength training") exercises offers additional benefits including but not limited to increased metabolism, prevention of osteoporosis, and improved overall cholesterol levels.  How Can I Strive For A Low-Fat Diet?: Current guidelines recommend that 25-35 percent of your daily energy (food) intake should come from fats.  One might ask how can this be achieved without having to dissect each meal on a daily basis?  Switch to skim or 1% milk instead of whole milk.  Focus on lean meats such as ground turkey, fresh fish, baked chicken, and lean cuts of beef as your source of dietary protein.  Limit saturated fat consumption to less than 10% of your daily caloric intake.  Limit trans fatty acid consumption primarily by limiting synthetic trans fats such as partially hydrogenated oils (Ex: fried fast foods).  Substitute olive or vegetable oil for solid fats where possible.  Moderation of Salt Intake: Provided you don't carry a diagnosis of congestive heart failure nor renal failure, I recommend a daily allowance of no more than 2300 mg of salt (sodium).  Keeping under this daily goal is associated with a decreased risk of cardiovascular events, creeping  above it can lead to elevated blood pressures and increases your risk of cardiovascular events.  Milligrams (mg) of salt is listed on all nutrition labels, and your daily intake can add up faster than you think.  Most canned and frozen dinners can pack in over half your daily salt allowance in one meal.    Lifestyle Health Risks: Certain lifestyle choices carry specific health risks.  As you may already know, tobacco use has been associated with increasing one's risk of cardiovascular disease, pulmonary disease, numerous cancers, among many other issues.  What you may not know is that there are medications and nicotine replacement strategies that can more than double your chances of successfully quitting.  I would be thrilled to help manage your quitting strategy if you currently use tobacco products.  When it comes to alcohol use, I've yet to find an "ideal" daily allowance.  Provided an individual does not have a medical condition that is exacerbated by alcohol consumption, general guidelines determine "safe drinking" as no more than two standard drinks for a man or no more than one standard drink for a female per day.  However, much debate still exists on whether any amount of alcohol consumption is technically "safe".  My general advice, keep alcohol consumption to a minimum for general health promotion.  If you or others believe that alcohol, tobacco, or recreational drug use is interfering with your life, I would be happy to provide confidential counseling regarding treatment options.  General "Over The Counter" Nutrition Advice: Postmenopausal women should aim for a daily calcium intake of 1200 mg, however a significant portion of this might already be   provided by diets including milk, yogurt, cheese, and other dairy products.  Vitamin D has been shown to help preserve bone density, prevent fatigue, and has even been shown to help reduce falls in the elderly.  Ensuring a daily intake of 800 Units of  Vitamin D is a good place to start to enjoy the above benefits, we can easily check your Vitamin D level to see if you'd potentially benefit from supplementation beyond 800 Units a day.  Folic Acid intake should be of particular concern to women of childbearing age.  Daily consumption of 400-800 mcg of Folic Acid is recommended to minimize the chance of spinal cord defects in a fetus should pregnancy occur.    For many adults, accidents still remain one of the most common culprits when it comes to cause of death.  Some of the simplest but most effective preventitive habits you can adopt include regular seatbelt use, proper helmet use, securing firearms, and regularly testing your smoke and carbon monoxide detectors.  Kimberly Wade B. Kimberly Poma DO [facility] [address], [address] [place], [place] [address] Phone: [phone]  

## 2014-04-05 ENCOUNTER — Telehealth: Payer: Self-pay

## 2014-04-05 NOTE — Telephone Encounter (Signed)
Left message for patient to call our office to schedule appointment. Receive referral from primary care.

## 2014-04-10 LAB — LIPID PANEL
Cholesterol: 156 mg/dL (ref 0–200)
HDL: 55 mg/dL (ref >39)
LDL CALC: 88 mg/dL (ref 0–99)
Total CHOL/HDL Ratio: 2.8 Ratio
Triglycerides: 65 mg/dL (ref <150)
VLDL: 13 mg/dL (ref 0–40)

## 2014-04-10 LAB — FERRITIN: Ferritin: 11 ng/mL (ref 10–291)

## 2014-04-10 LAB — CBC
HEMATOCRIT: 33 % — AB (ref 36.0–46.0)
Hemoglobin: 11.1 g/dL — ABNORMAL LOW (ref 12.0–15.0)
MCH: 29.5 pg (ref 26.0–34.0)
MCHC: 33.6 g/dL (ref 30.0–36.0)
MCV: 87.8 fL (ref 78.0–100.0)
Platelets: 210 10*3/uL (ref 150–400)
RBC: 3.76 MIL/uL — AB (ref 3.87–5.11)
RDW: 15 % (ref 11.5–15.5)
WBC: 6.1 10*3/uL (ref 4.0–10.5)

## 2014-04-10 LAB — COMPLETE METABOLIC PANEL WITH GFR
ALT: 16 U/L (ref 0–35)
AST: 19 U/L (ref 0–37)
Albumin: 4 g/dL (ref 3.5–5.2)
Alkaline Phosphatase: 66 U/L (ref 39–117)
BILIRUBIN TOTAL: 0.5 mg/dL (ref 0.2–1.2)
BUN: 12 mg/dL (ref 6–23)
CHLORIDE: 108 meq/L (ref 96–112)
CO2: 24 mEq/L (ref 19–32)
Calcium: 8.4 mg/dL (ref 8.4–10.5)
Creat: 0.56 mg/dL (ref 0.50–1.10)
GFR, Est African American: >89 mL/min
GFR, Est Non African American: >89 mL/min
Glucose, Bld: 88 mg/dL (ref 70–99)
Potassium: 3.6 mEq/L (ref 3.5–5.3)
SODIUM: 142 meq/L (ref 135–145)
TOTAL PROTEIN: 6.4 g/dL (ref 6.0–8.3)

## 2014-04-10 LAB — VITAMIN B12: VITAMIN B 12: 375 pg/mL (ref 211–911)

## 2014-04-10 LAB — FOLATE: Folate: 15.1 ng/mL

## 2014-04-10 LAB — PREALBUMIN: Prealbumin: 19.7 mg/dL (ref 17.0–34.0)

## 2014-04-11 LAB — VITAMIN D 25 HYDROXY (VIT D DEFICIENCY, FRACTURES): Vit D, 25-Hydroxy: 32 ng/mL (ref 30–89)

## 2014-04-12 ENCOUNTER — Telehealth: Payer: Self-pay | Admitting: Family Medicine

## 2014-04-12 NOTE — Telephone Encounter (Signed)
Message left on voicemail with results

## 2014-04-12 NOTE — Telephone Encounter (Signed)
Kimberly Wade, Will you please let patient know that her cholesterol, blood sugar, kidney and liver function were all within normal limits.  All of her vitamin and mineral labs were within normal limits except for her Ferritin being on the borderline of low.  This can be improved by increasing her iron supplement to twice a day with meals.  This is probably directly linked to her chronic borderline anemia which is unchanged over the past year.  Vitamin B1 is still pending, I'll contact her if it's not normal once it returns. (Will you also please let Dr. Loistine Chance office know that these labs are available in [place] [phone])

## 2014-04-12 NOTE — Telephone Encounter (Signed)
Labs faxed to [place]

## 2014-04-14 LAB — VITAMIN B1: Vitamin B1 (Thiamine): 21 nmol/L (ref 8–30)

## 2014-05-24 ENCOUNTER — Encounter: Payer: Self-pay | Admitting: Emergency Medicine

## 2014-05-24 ENCOUNTER — Emergency Department (INDEPENDENT_AMBULATORY_CARE_PROVIDER_SITE_OTHER)
Admission: EM | Admit: 2014-05-24 | Discharge: 2014-05-24 | Disposition: A | Discharge status: Home / Self Care | Payer: 59 | Source: Home / Self Care | Attending: Emergency Medicine | Admitting: Emergency Medicine

## 2014-05-24 DIAGNOSIS — J0101 Acute recurrent maxillary sinusitis: Secondary | ICD-10-CM

## 2014-05-24 DIAGNOSIS — G44219 Episodic tension-type headache, not intractable: Secondary | ICD-10-CM

## 2014-05-24 MED ORDER — CEPHALEXIN 500 MG PO CAPS: 500.0000 mg | ORAL_CAPSULE | Freq: Four times a day (QID) | ORAL | Status: DC

## 2014-05-24 MED ORDER — HYDROCODONE-ACETAMINOPHEN 5-325 MG PO TABS: 1.0000 | ORAL_TABLET | Freq: Four times a day (QID) | ORAL | Status: DC | PRN

## 2014-05-24 MED ORDER — METHYLPREDNISOLONE SODIUM SUCC 125 MG IJ SOLR
125.0000 mg | Freq: Once | INTRAMUSCULAR | Status: AC
  Administered 2014-05-24: 125 mg via INTRAMUSCULAR

## 2014-05-24 MED ORDER — KETOROLAC TROMETHAMINE 60 MG/2ML IM SOLN
60.0000 mg | Freq: Once | INTRAMUSCULAR | Status: AC
  Administered 2014-05-24: 60 mg via INTRAMUSCULAR

## 2014-05-24 NOTE — Discharge Instructions (Signed)
Headaches, Frequently Asked Questions MIGRAINE HEADACHES Q: What is migraine? What causes it? How can I treat it? A: Generally, migraine headaches begin as a dull ache. Then they develop into a constant, throbbing, and pulsating pain. You may experience pain at the temples. You may experience pain at the front or back of one or both sides of the head. The pain is usually accompanied by a combination of:  Nausea.  Vomiting.  Sensitivity to light and noise. Some people (about 15%) experience an aura (see below) before an attack. The cause of migraine is believed to be chemical reactions in the brain. Treatment for migraine may include over-the-counter or prescription medications. It may also include self-help techniques. These include relaxation training and biofeedback.  Q: What is an aura? A: About 15% of people with migraine get an "aura". This is a sign of neurological symptoms that occur before a migraine headache. You may see wavy or jagged lines, dots, or flashing lights. You might experience tunnel vision or blind spots in one or both eyes. The aura can include visual or auditory hallucinations (something imagined). It may include disruptions in smell (such as strange odors), taste or touch. Other symptoms include:  Numbness.  A "pins and needles" sensation.  Difficulty in recalling or speaking the correct word. These neurological events may last as long as 60 minutes. These symptoms will fade as the headache begins. Q: What is a trigger? A: Certain physical or environmental factors can lead to or "trigger" a migraine. These include:  Foods.  Hormonal changes.  Weather.  Stress. It is important to remember that triggers are different for everyone. To help prevent migraine attacks, you need to figure out which triggers affect you. Keep a headache diary. This is a good way to track triggers. The diary will help you talk to your healthcare professional about your condition. Q: Does  weather affect migraines? A: Bright sunshine, hot, humid conditions, and drastic changes in barometric pressure may lead to, or "trigger," a migraine attack in some people. But studies have shown that weather does not act as a trigger for everyone with migraines. Q: What is the link between migraine and hormones? A: Hormones start and regulate many of your body's functions. Hormones keep your body in balance within a constantly changing environment. The levels of hormones in your body are unbalanced at times. Examples are during menstruation, pregnancy, or menopause. That can lead to a migraine attack. In fact, about three quarters of all women with migraine report that their attacks are related to the menstrual cycle.  Q: Is there an increased risk of stroke for migraine sufferers? A: The likelihood of a migraine attack causing a stroke is very remote. That is not to say that migraine sufferers cannot have a stroke associated with their migraines. In persons under age 53, the most common associated factor for stroke is migraine headache. But over the course of a person's normal life span, the occurrence of migraine headache may actually be associated with a reduced risk of dying from cerebrovascular disease due to stroke.  Q: What are acute medications for migraine? A: Acute medications are used to treat the pain of the headache after it has started. Examples over-the-counter medications, NSAIDs, ergots, and triptans.  Q: What are the triptans? A: Triptans are the newest class of abortive medications. They are specifically targeted to treat migraine. Triptans are vasoconstrictors. They moderate some chemical reactions in the brain. The triptans work on receptors in your brain. Triptans help  to restore the balance of a neurotransmitter called serotonin. Fluctuations in levels of serotonin are thought to be a main cause of migraine.  °Q: Are over-the-counter medications for migraine effective? °A:  Over-the-counter, or "OTC," medications may be effective in relieving mild to moderate pain and associated symptoms of migraine. But you should see your caregiver before beginning any treatment regimen for migraine.  °Q: What are preventive medications for migraine? °A: Preventive medications for migraine are sometimes referred to as "prophylactic" treatments. They are used to reduce the frequency, severity, and length of migraine attacks. Examples of preventive medications include antiepileptic medications, antidepressants, beta-blockers, calcium channel blockers, and NSAIDs (nonsteroidal anti-inflammatory drugs). °Q: Why are anticonvulsants used to treat migraine? °A: During the past few years, there has been an increased interest in antiepileptic drugs for the prevention of migraine. They are sometimes referred to as "anticonvulsants". Both epilepsy and migraine may be caused by similar reactions in the brain.  °Q: Why are antidepressants used to treat migraine? °A: Antidepressants are typically used to treat people with depression. They may reduce migraine frequency by regulating chemical levels, such as serotonin, in the brain.  °Q: What alternative therapies are used to treat migraine? °A: The term "alternative therapies" is often used to describe treatments considered outside the scope of conventional Western medicine. Examples of alternative therapy include acupuncture, acupressure, and yoga. Another common alternative treatment is herbal therapy. Some herbs are believed to relieve headache pain. Always discuss alternative therapies with your caregiver before proceeding. Some herbal products contain arsenic and other toxins. °TENSION HEADACHES °Q: What is a tension-type headache? What causes it? How can I treat it? °A: Tension-type headaches occur randomly. They are often the result of temporary stress, anxiety, fatigue, or anger. Symptoms include soreness in your temples, a tightening band-like sensation  around your head (a "vice-like" ache). Symptoms can also include a pulling feeling, pressure sensations, and contracting head and neck muscles. The headache begins in your forehead, temples, or the back of your head and neck. Treatment for tension-type headache may include over-the-counter or prescription medications. Treatment may also include self-help techniques such as relaxation training and biofeedback. °CLUSTER HEADACHES °Q: What is a cluster headache? What causes it? How can I treat it? °A: Cluster headache gets its name because the attacks come in groups. The pain arrives with little, if any, warning. It is usually on one side of the head. A tearing or bloodshot eye and a runny nose on the same side of the headache may also accompany the pain. Cluster headaches are believed to be caused by chemical reactions in the brain. They have been described as the most severe and intense of any headache type. Treatment for cluster headache includes prescription medication and oxygen. °SINUS HEADACHES °Q: What is a sinus headache? What causes it? How can I treat it? °A: When a cavity in the bones of the face and skull (a sinus) becomes inflamed, the inflammation will cause localized pain. This condition is usually the result of an allergic reaction, a tumor, or an infection. If your headache is caused by a sinus blockage, such as an infection, you will probably have a fever. An x-ray will confirm a sinus blockage. Your caregiver's treatment might include antibiotics for the infection, as well as antihistamines or decongestants.  °REBOUND HEADACHES °Q: What is a rebound headache? What causes it? How can I treat it? °A: A pattern of taking acute headache medications too often can lead to a condition known as "rebound headache."   A pattern of taking too much headache medication includes taking it more than 2 days per week or in excessive amounts. That means more than the label or a caregiver advises. With rebound  headaches, your medications not only stop relieving pain, they actually begin to cause headaches. Doctors treat rebound headache by tapering the medication that is being overused. Sometimes your caregiver will gradually substitute a different type of treatment or medication. Stopping may be a challenge. Regularly overusing a medication increases the potential for serious side effects. Consult a caregiver if you regularly use headache medications more than 2 days per week or more than the label advises. ADDITIONAL QUESTIONS AND ANSWERS Q: What is biofeedback? A: Biofeedback is a self-help treatment. Biofeedback uses special equipment to monitor your body's involuntary physical responses. Biofeedback monitors:  Breathing.  Pulse.  Heart rate.  Temperature.  Muscle tension.  Brain activity. Biofeedback helps you refine and perfect your relaxation exercises. You learn to control the physical responses that are related to stress. Once the technique has been mastered, you do not need the equipment any more. Q: Are headaches hereditary? A: Four out of five (80%) of people that suffer report a family history of migraine. Scientists are not sure if this is genetic or a family predisposition. Despite the uncertainty, a child has a 50% chance of having migraine if one parent suffers. The child has a 75% chance if both parents suffer.  Q: Can children get headaches? A: By the time they reach high school, most young people have experienced some type of headache. Many safe and effective approaches or medications can prevent a headache from occurring or stop it after it has begun.  Q: What type of doctor should I see to diagnose and treat my headache? A: Start with your primary caregiver. Discuss his or her experience and approach to headaches. Discuss methods of classification, diagnosis, and treatment. Your caregiver may decide to recommend you to a headache specialist, depending upon your symptoms or other  physical conditions. Having diabetes, allergies, etc., may require a more comprehensive and inclusive approach to your headache. The National Headache Foundation will provide, upon request, a list of [facility] physician members in your state. Document Released: 10/25/2003 Document Revised: 10/27/2011 Document Reviewed: 04/03/2008 [facility] Patient Information 2015 [place], [place]. This information is not intended to replace advice given to you by your health care provider. Make sure you discuss any questions you have with your health care provider. Sinusitis Sinusitis is redness, soreness, and inflammation of the paranasal sinuses. Paranasal sinuses are air pockets within the bones of your face (beneath the eyes, the middle of the forehead, or above the eyes). In healthy paranasal sinuses, mucus is able to drain out, and air is able to circulate through them by way of your nose. However, when your paranasal sinuses are inflamed, mucus and air can become trapped. This can allow bacteria and other germs to grow and cause infection. Sinusitis can develop quickly and last only a short time (acute) or continue over a long period (chronic). Sinusitis that lasts for more than 12 weeks is considered chronic.  CAUSES  Causes of sinusitis include:  Allergies.  Structural abnormalities, such as displacement of the cartilage that separates your nostrils (deviated septum), which can decrease the air flow through your nose and sinuses and affect sinus drainage.  Functional abnormalities, such as when the small hairs (cilia) that line your sinuses and help remove mucus do not work properly or are not present. SIGNS AND SYMPTOMS  Symptoms of acute and chronic sinusitis are the same. The primary symptoms are pain and pressure around the affected sinuses. Other symptoms include:  Upper toothache.  Earache.  Headache.  Bad breath.  Decreased sense of smell and taste.  A cough, which worsens when you are lying  flat.  Fatigue.  Fever.  Thick drainage from your nose, which often is green and may contain pus (purulent).  Swelling and warmth over the affected sinuses. DIAGNOSIS  Your health care provider will perform a physical exam. During the exam, your health care provider may:  Look in your nose for signs of abnormal growths in your nostrils (nasal polyps).  Tap over the affected sinus to check for signs of infection.  View the inside of your sinuses (endoscopy) using an imaging device that has a light attached (endoscope). If your health care provider suspects that you have chronic sinusitis, one or more of the following tests may be recommended:  Allergy tests.  Nasal culture. A sample of mucus is taken from your nose, sent to a lab, and screened for bacteria.  Nasal cytology. A sample of mucus is taken from your nose and examined by your health care provider to determine if your sinusitis is related to an allergy. TREATMENT  Most cases of acute sinusitis are related to a viral infection and will resolve on their own within 10 days. Sometimes medicines are prescribed to help relieve symptoms (pain medicine, decongestants, nasal steroid sprays, or saline sprays).  However, for sinusitis related to a bacterial infection, your health care provider will prescribe antibiotic medicines. These are medicines that will help kill the bacteria causing the infection.  Rarely, sinusitis is caused by a fungal infection. In theses cases, your health care provider will prescribe antifungal medicine. For some cases of chronic sinusitis, surgery is needed. Generally, these are cases in which sinusitis recurs more than 3 times per year, despite other treatments. HOME CARE INSTRUCTIONS   Drink plenty of water. Water helps thin the mucus so your sinuses can drain more easily.  Use a humidifier.  Inhale steam 3 to 4 times a day (for example, sit in the bathroom with the shower running).  Apply a warm,  moist washcloth to your face 3 to 4 times a day, or as directed by your health care provider.  Use saline nasal sprays to help moisten and clean your sinuses.  Take medicines only as directed by your health care provider.  If you were prescribed either an antibiotic or antifungal medicine, finish it all even if you start to feel better. SEEK IMMEDIATE MEDICAL CARE IF:  You have increasing pain or severe headaches.  You have nausea, vomiting, or drowsiness.  You have swelling around your face.  You have vision problems.  You have a stiff neck.  You have difficulty breathing. MAKE SURE YOU:   Understand these instructions.  Will watch your condition.  Will get help right away if you are not doing well or get worse. Document Released: 08/04/2005 Document Revised: 12/19/2013 Document Reviewed: 08/19/2011 [facility] Patient Information 2015 [place], [place]. This information is not intended to replace advice given to you by your health care provider. Make sure you discuss any questions you have with your health care provider.

## 2014-05-24 NOTE — ED Notes (Signed)
Headache x 6 days, intermittent nausea, runny nose

## 2014-05-24 NOTE — ED Provider Notes (Signed)
CSN: [id number]     Arrival date & time 05/24/14  3825 History   First MD Initiated Contact with Patient 05/24/14 [phone]     Chief Complaint  Patient presents with  . Headache   (Consider location/radiation/quality/duration/timing/severity/associated sxs/prior Treatment) Patient is a 43 y.o. female presenting with headaches. The history is provided by the patient. No language interpreter was used.  Headache Pain location:  Frontal Quality:  Sharp Radiates to:  Face Severity currently:  7/10 Severity at highest:  7/10 Onset quality:  Gradual Duration:  6 days Timing:  Constant Progression:  Worsening Chronicity:  New Context: not eating   Relieved by:  Nothing Worsened by:  Nothing tried Ineffective treatments:  Acetaminophen Associated symptoms: sinus pressure   Associated symptoms: no abdominal pain   Risk factors: no anger     Past Medical History  Diagnosis Date  . Anxiety   . GERD (gastroesophageal reflux disease)   . Insomnia   . Pneumonia   . Hypertension   . Diabetes   . Hyperlipidemia   . Pneumococcal pneumonia 2000   Past Surgical History  Procedure Laterality Date  . Cesarean section      x4  . Chest tube insertion    . Gastric bypass    . Dysplastic mole removed      right ankle  . Chest tubes  2000  . Recoonstruction of right lung    . Mini gastric bypass  2011  . Video bronchoscopy Bilateral 04/20/2013    Procedure: VIDEO BRONCHOSCOPY WITH FLUORO;  Surgeon: Elsie Stain, MD;  Location: [facility];  Service: Cardiopulmonary;  Laterality: Bilateral;   Family History  Problem Relation Age of Onset  . Colon cancer      grandmother  . Heart attack      grandfather  . Diabetes Father     father  . Hypertension Father   . Hyperlipidemia    . Hypertension Mother   . Asthma Daughter    History  Substance Use Topics  . Smoking status: Never Smoker   . Smokeless tobacco: Never Used  . Alcohol Use: Yes     Comment: 1 glass per month   OB  History   Grav Para Term Preterm Abortions TAB SAB Ect Mult Living                 Review of Systems  HENT: Positive for sinus pressure.   Gastrointestinal: Negative for abdominal pain.  Neurological: Positive for headaches.  All other systems reviewed and are negative.   Allergies  Levaquin and Penicillins  Home Medications   Prior to Admission medications   Medication Sig Start Date End Date Taking? Authorizing Provider  ALPRAZolam Duanne Moron) 1 MG tablet Take 1 tablet (1 mg total) by mouth 3 (three) times daily as needed for sleep. 04/04/14   Sean Hommel, DO  BIOTIN PO Take 1 capsule by mouth daily.    Historical Provider, MD  ferrous sulfate 325 (65 FE) MG tablet Take 1 tablet (325 mg total) by mouth 2 (two) times daily with a meal. 04/12/14   Marcial Pacas, DO  HYDROcodone-acetaminophen (NORCO/VICODIN) 5-325 MG per tablet Take 1 tablet by mouth every 6 (six) hours as needed for moderate pain. 04/04/14   Marcial Pacas, DO  levocetirizine (XYZAL) 5 MG tablet Take 1 tablet (5 mg total) by mouth every evening. 04/04/14   Marcial Pacas, DO  lisinopril-hydrochlorothiazide (ZESTORETIC) 20-12.5 MG per tablet Take 1 tablet by mouth daily. 04/04/14   Marcial Pacas,  DO  Multiple Vitamin (MULTIVITAMIN) tablet Take 1 tablet by mouth 3 (three) times daily.    Historical Provider, MD  omeprazole (PRILOSEC) 40 MG capsule Take 1 capsule (40 mg total) by mouth 2 (two) times daily before a meal. 04/04/14   Sean Hommel, DO  SUMAtriptan (IMITREX) 25 MG tablet Take 1 tablet (25 mg total) by mouth every 2 (two) hours as needed for migraine. 09/26/12   Shanda Howells, MD  traZODone (DESYREL) 100 MG tablet TAKE 1-2 TABS AT BEDTIME ONLY AS NEEDED. 04/04/14   Marcial Pacas, DO  Vitamin D, Ergocalciferol, (DRISDOL) 50000 UNITS CAPS capsule Take 1 capsule (50,000 Units total) by mouth every 7 (seven) days. 04/04/14   Sean Hommel, DO   BP 111/70  Pulse 83  Temp(Src) 98.5 F (36.9 C) (Oral)  Ht 5\' 6"  (1.676 m)  Wt 192 lb (87.091  kg)  BMI 31.00 kg/m2  SpO2 98% Physical Exam  Nursing note and vitals reviewed. Constitutional: She is oriented to person, place, and time. She appears well-developed and well-nourished.  HENT:  Head: Normocephalic and atraumatic.  Right Ear: External ear normal.  Left Ear: External ear normal.  Mouth/Throat: Oropharynx is clear and moist.  Eyes: Conjunctivae and EOM are normal.  Neck: Normal range of motion.  Cardiovascular: Normal rate and normal heart sounds.   Pulmonary/Chest: Effort normal and breath sounds normal.  Abdominal: Soft. She exhibits no distension.  Musculoskeletal: Normal range of motion.  Neurological: She is alert and oriented to person, place, and time.  Skin: Skin is warm.  Psychiatric: She has a normal mood and affect.    ED Course  Procedures (including critical care time) Labs Review Labs Reviewed - No data to display  Imaging Review No results found.   MDM   1. Episodic tension-type headache, not intractable   2. Acute recurrent maxillary sinusitis     Solumedrol torodol  Keflex  Hydrocodone Follow up with Dr. Charlotte Crumb, PA-C 05/24/14 [phone]

## 2014-05-25 NOTE — ED Provider Notes (Signed)
Medical history/examination/treatment/procedure(s) were performed by non-physician provider and as supervising physician I was immediately available for consultation/collaboration.  Jacqulyn Cane, MD 05/25/14 [phone]

## 2014-05-26 ENCOUNTER — Emergency Department (INDEPENDENT_AMBULATORY_CARE_PROVIDER_SITE_OTHER)
Admission: EM | Admit: 2014-05-26 | Discharge: 2014-05-26 | Disposition: A | Discharge status: Home / Self Care | Payer: 59 | Source: Home / Self Care | Attending: Emergency Medicine | Admitting: Emergency Medicine

## 2014-05-26 ENCOUNTER — Encounter: Payer: Self-pay | Admitting: Emergency Medicine

## 2014-05-26 DIAGNOSIS — J0100 Acute maxillary sinusitis, unspecified: Secondary | ICD-10-CM

## 2014-05-26 DIAGNOSIS — G44019 Episodic cluster headache, not intractable: Secondary | ICD-10-CM

## 2014-05-26 MED ORDER — AZITHROMYCIN 250 MG PO TABS: ORAL_TABLET | ORAL | Status: DC

## 2014-05-26 MED ORDER — SUMATRIPTAN SUCCINATE 6 MG/0.5ML ~~LOC~~ SOLN
6.0000 mg | Freq: Once | SUBCUTANEOUS | Status: AC
  Administered 2014-05-26: 6 mg via SUBCUTANEOUS

## 2014-05-26 MED ORDER — PREDNISONE (PAK) 10 MG PO TABS: ORAL_TABLET | ORAL | Status: DC

## 2014-05-26 MED ORDER — SUMATRIPTAN SUCCINATE 6 MG/0.5ML ~~LOC~~ SOLN: 6.0000 mg | SUBCUTANEOUS | Status: DC | PRN

## 2014-05-26 NOTE — ED Notes (Signed)
Appointment with Dr Ileene Rubens on Monday, Oct 12 at 1pm, patient acknowledges understanding

## 2014-05-26 NOTE — ED Notes (Signed)
Headache, nausea over one week was seen 2 days ago, given Toradol and Solumedrol, felt better woke up next am with headache and nausea, runny nose

## 2014-05-26 NOTE — ED Provider Notes (Addendum)
CSN: [id number]     Arrival date & time 05/26/14  1002 History   First MD Initiated Contact with Patient 05/26/14 1011     Chief Complaint  Patient presents with  . Headache   Patient is a 43 y.o. female presenting with headaches. The history is provided by the patient.  Headache Location: Right frontal, orbital, maxillary and temporal and parietal. Quality:  Dull Radiates to:  Does not radiate Severity currently:  7/10 Severity at highest:  8/10 Onset quality:  Unable to specify (Onset 8 days ago, recalls no trauma) Duration:  8 days Timing:  Constant Progression:  Worsening Chronicity:  New (She states she hasn't had any headaches like this for over 3 years) Similar to prior headaches: no (History of cluster migraines 3 years ago, she states this does not feel like those)   Context: bright light (Minimally)   Context: not loud noise and not straining   Relieved by:  Nothing Worsened by:  Activity and neck movement Associated symptoms: congestion, cough, drainage, eye pain (Right orbit), facial pain (Right side), fatigue, fever, nausea, photophobia (Minimal) and sinus pressure (Right, with scant discolored rhinorrhea)   Associated symptoms: no abdominal pain, no back pain, no blurred vision, no diarrhea, no ear pain, no focal weakness, no loss of balance, no neck stiffness, no numbness, no paresthesias, no seizures, no syncope, no visual change, no vomiting and no weakness    She was seen here in urgent care 2 days ago, treated with Solu-Medrol and Toradol IM.--That helped a little bit temporarily for less than a day, but then symptoms are much worse now. She was also treated with Keflex. Was prescribed hydrocodone and that did not help significantly. She denies having any headaches for the past 3 years until 8 days ago.   Past Medical History  Diagnosis Date  . Anxiety   . GERD (gastroesophageal reflux disease)   . Insomnia   . Pneumonia   . Hypertension   . Diabetes   .  Hyperlipidemia   . Pneumococcal pneumonia 2000   Past Surgical History  Procedure Laterality Date  . Cesarean section      x4  . Chest tube insertion    . Gastric bypass    . Dysplastic mole removed      right ankle  . Chest tubes  2000  . Recoonstruction of right lung    . Mini gastric bypass  2011  . Video bronchoscopy Bilateral 04/20/2013    Procedure: VIDEO BRONCHOSCOPY WITH FLUORO;  Surgeon: Elsie Stain, MD;  Location: [facility];  Service: Cardiopulmonary;  Laterality: Bilateral;   Family History  Problem Relation Age of Onset  . Colon cancer      grandmother  . Heart attack      grandfather  . Diabetes Father     father  . Hypertension Father   . Hyperlipidemia    . Hypertension Mother   . Asthma Daughter    History  Substance Use Topics  . Smoking status: Never Smoker   . Smokeless tobacco: Never Used  . Alcohol Use: Yes     Comment: 1 glass per month   OB History   Grav Para Term Preterm Abortions TAB SAB Ect Mult Living                 Review of Systems  Constitutional: Positive for fever and fatigue.  HENT: Positive for congestion, postnasal drip and sinus pressure (Right, with scant discolored rhinorrhea). Negative for ear pain.  Eyes: Positive for photophobia (Minimal) and pain (Right orbit). Negative for blurred vision.  Respiratory: Positive for cough.   Cardiovascular: Negative for syncope.  Gastrointestinal: Positive for nausea. Negative for vomiting, abdominal pain and diarrhea.  Musculoskeletal: Negative for back pain and neck stiffness.  Neurological: Positive for headaches. Negative for focal weakness, seizures, numbness, paresthesias and loss of balance.  All other systems reviewed and are negative.   Allergies  Levaquin and Penicillins  Home Medications   Prior to Admission medications   Medication Sig Start Date End Date Taking? Authorizing Provider  ALPRAZolam Duanne Moron) 1 MG tablet Take 1 tablet (1 mg total) by mouth 3 (three)  times daily as needed for sleep. 04/04/14   Marcial Pacas, DO  azithromycin (ZITHROMAX Z-PAK) 250 MG tablet Take 2 tablets on day one, then 1 tablet daily on days 2 through 5 05/26/14   Jacqulyn Cane, MD  BIOTIN PO Take 1 capsule by mouth daily.    Historical Provider, MD  cephALEXin (KEFLEX) 500 MG capsule Take 1 capsule (500 mg total) by mouth 4 (four) times daily. 05/24/14   Fransico Meadow, PA-C  ferrous sulfate 325 (65 FE) MG tablet Take 1 tablet (325 mg total) by mouth 2 (two) times daily with a meal. 04/12/14   Sean Hommel, DO  levocetirizine (XYZAL) 5 MG tablet Take 1 tablet (5 mg total) by mouth every evening. 04/04/14   Marcial Pacas, DO  lisinopril-hydrochlorothiazide (ZESTORETIC) 20-12.5 MG per tablet Take 1 tablet by mouth daily. 04/04/14   Marcial Pacas, DO  Multiple Vitamin (MULTIVITAMIN) tablet Take 1 tablet by mouth 3 (three) times daily.    Historical Provider, MD  omeprazole (PRILOSEC) 40 MG capsule Take 1 capsule (40 mg total) by mouth 2 (two) times daily before a meal. 04/04/14   Sean Hommel, DO  predniSONE (STERAPRED UNI-PAK) 10 MG tablet Take as directed for 6 days.--Take 6 on day 1, 5 on day 2, 4 on day 3, then 3 tablets on day 4, then 2 tablets on day 5, then 1 on day 6. 05/26/14   Jacqulyn Cane, MD  SUMAtriptan (IMITREX) 25 MG tablet Take 1 tablet (25 mg total) by mouth every 2 (two) hours as needed for migraine. 09/26/12   Shanda Howells, MD  SUMAtriptan (IMITREX) 6 MG/0.5ML SOLN injection Inject 0.5 mLs (6 mg total) into the skin every 2 (two) hours as needed for migraine or headache. May repeat once in 2 hours if headache persists or recurs.--Maximum 2 doses in 24 hours 05/26/14   Jacqulyn Cane, MD  traZODone (DESYREL) 100 MG tablet TAKE 1-2 TABS AT BEDTIME ONLY AS NEEDED. 04/04/14   Marcial Pacas, DO  Vitamin D, Ergocalciferol, (DRISDOL) 50000 UNITS CAPS capsule Take 1 capsule (50,000 Units total) by mouth every 7 (seven) days. 04/04/14   Sean Hommel, DO   BP 120/77  Pulse 58  Temp(Src) 98.2 F  (36.8 C) (Oral)  Ht 5\' 6"  (1.676 m)  Wt 190 lb (86.183 kg)  BMI 30.68 kg/m2  SpO2 99% Physical Exam  Nursing note and vitals reviewed. Constitutional: She appears well-developed and well-nourished. She appears distressed (Uncomfortable from headache.).  HENT:  Head: Normocephalic and atraumatic. Head is without raccoon's eyes, without contusion, without right periorbital erythema and without left periorbital erythema.  Right Ear: External ear and ear canal normal. No drainage. Tympanic membrane is not erythematous.  Left Ear: External ear and ear canal normal. No drainage. Tympanic membrane is not erythematous.  Nose: Right sinus exhibits maxillary sinus tenderness and frontal  sinus tenderness. Left sinus exhibits no maxillary sinus tenderness and no frontal sinus tenderness.  Mouth/Throat: Oropharynx is clear and moist and mucous membranes are normal. No oral lesions. No dental abscesses. No oropharyngeal exudate, posterior oropharyngeal edema or posterior oropharyngeal erythema.  Head without deformity, but tender right orbit, frontal, maxillary and temporal area.  No cranial bruits.  Normal temporal arteries.  Eyes: Conjunctivae, EOM and lids are normal. Pupils are equal, round, and reactive to light.  Fundoscopic exam:      The right eye shows no exudate, no hemorrhage and no papilledema.       The left eye shows no exudate, no hemorrhage and no papilledema.  Neck: Neck supple. Normal carotid pulses and no JVD present. Carotid bruit is not present. No tracheal deviation present. No thyromegaly present.  Cardiovascular: Regular rhythm and normal heart sounds.   Pulmonary/Chest: Effort normal and breath sounds normal. No respiratory distress. She has no wheezes. She has no rales.  Abdominal: Soft. There is no tenderness.  Musculoskeletal: She exhibits no edema and no tenderness.  Neurological: She is alert. She has normal strength and normal reflexes. No cranial nerve deficit or sensory  deficit. Coordination normal.  Reflex Scores:      Tricep reflexes are 2+ on the right side and 2+ on the left side.      Bicep reflexes are 2+ on the right side and 2+ on the left side.      Brachioradialis reflexes are 2+ on the right side and 2+ on the left side.      Patellar reflexes are 2+ on the right side and 2+ on the left side.      Achilles reflexes are 2+ on the right side and 2+ on the left side. Skin: Skin is warm and dry. No rash noted.  Psychiatric: She has a normal mood and affect.   Visual acuity grossly intact bilaterally. ED Course--Imitrex 6 mg subcutaneous stat given.   Procedures (including critical care time) Labs Review Labs Reviewed - No data to display  Imaging Review No results found.   MDM   1. Episodic cluster headache, not intractable   2. Acute maxillary sinusitis, recurrence not specified     Imitrex 6 mg subcutaneous stat given. --Patient observed and vital signs remained stable and headache decreased to 3-4/10. Clinically, no evidence of any acute intracranial process. Neurologic exam intact, nonfocal, and she improved with Imitrex . Treatment options discussed, as well as risks, benefits, alternatives. Patient voiced understanding and agreement with the following plans: Continue Keflex, but add Zithromax Z-Pak. Prednisone oral burst, 10 mg-6 day Dosepak. Prescription for Imitrex 6 mg subcutaneous injection, with instructions and precautions. She declined any antinausea med as her nausea improved here in urgent care. She states that she has Phenergan tablets at home to use when necessary. She states that Norco caused nausea, so advised DC Norco. Rest at home for next 3 days.--Excused from work from Monday October 5 through Monday, October 12. After that time, any work note would need to be completed by Dr. Ileene Rubens, her PCP.--Patient gave me FMLA forms, and I explained that I would try to complete these if I could, but if complexity prohibits that,  I'll defer to Dr. Ileene Rubens, her PCP.  Follow-up appointment made with Dr. Ileene Rubens from Monday, October 12 at 1 PM. Precautions discussed. Red flags discussed.--- Go to emergency room if any red flag Questions invited and answered. Patient voiced understanding and agreement.    Jacqulyn Cane, MD 05/26/14  [place], MD 05/26/14 [phone]

## 2014-05-27 ENCOUNTER — Telehealth: Payer: Self-pay | Admitting: Emergency Medicine

## 2014-05-29 ENCOUNTER — Encounter: Payer: Self-pay | Admitting: Family Medicine

## 2014-05-29 ENCOUNTER — Ambulatory Visit (INDEPENDENT_AMBULATORY_CARE_PROVIDER_SITE_OTHER): Payer: 59 | Admitting: Family Medicine

## 2014-05-29 VITALS — BP 129/70 | HR 81 | Temp 98.0°F | Wt 190.0 lb

## 2014-05-29 DIAGNOSIS — I10 Essential (primary) hypertension: Secondary | ICD-10-CM

## 2014-05-29 DIAGNOSIS — J0100 Acute maxillary sinusitis, unspecified: Secondary | ICD-10-CM

## 2014-05-29 MED ORDER — HYDROCHLOROTHIAZIDE 25 MG PO TABS: ORAL_TABLET | ORAL | Status: DC

## 2014-05-29 NOTE — Progress Notes (Signed)
CC: Kimberly Wade is a 43 y.o. female is here for f/u sinusitis?   Subjective: HPI:  Emergency room followup: She describes the headache localized to the forehead that was nonradiating that was moderate to severe in severity that have been present for one week prior to her presenting to our urgent care/emergency room last week twice. Initial intervention of Keflex did not provide any benefit, on a second visit she was prescribed azithromycin, had a Solu-Medrol injection, Imitrex injection, and prednisone taper. She tells me that her headache was 100% gone within 12 hours after the above interventions and is no longer present. She feels like she's back to her normal state of health. Over the weekend she denies fevers, chills, cough, shortness of breath, facial pain, motor or sensory disturbances. She has FMLA paperwork to be filled out today reflecting that she was suffering from this illness from October 1 up until Today, plan to return on Wednesday.  And when going over her medications there is some miscommunication where she was under the impression that she should be taking lisinopril plus hydrochlorothiazide in combination formulation in addition to an additional 25 mg of hydrochlorothiazide. She denies any known side effects in this current regimen nor any outside blood pressures to report.   Review Of Systems Outlined In HPI  Past Medical History  Diagnosis Date  . Anxiety   . GERD (gastroesophageal reflux disease)   . Insomnia   . Pneumonia   . Hypertension   . Diabetes   . Hyperlipidemia   . Pneumococcal pneumonia 2000    Past Surgical History  Procedure Laterality Date  . Cesarean section      x4  . Chest tube insertion    . Gastric bypass    . Dysplastic mole removed      right ankle  . Chest tubes  2000  . Recoonstruction of right lung    . Mini gastric bypass  2011  . Video bronchoscopy Bilateral 04/20/2013    Procedure: VIDEO BRONCHOSCOPY WITH FLUORO;  Surgeon: Elsie Stain, MD;  Location: [facility];  Service: Cardiopulmonary;  Laterality: Bilateral;   Family History  Problem Relation Age of Onset  . Colon cancer      grandmother  . Heart attack      grandfather  . Diabetes Father     father  . Hypertension Father   . Hyperlipidemia    . Hypertension Mother   . Asthma Daughter     History   Social History  . Marital Status: Legally Separated    Spouse Name: N/A    Number of Children: N/A  . Years of Education: N/A   Occupational History  . Advertising Accountant    Social History Main Topics  . Smoking status: Never Smoker   . Smokeless tobacco: Never Used  . Alcohol Use: Yes     Comment: 1 glass per month  . Drug Use: No  . Sexual Activity: Not on file   Other Topics Concern  . Not on file   Social History Narrative  . No narrative on file     Objective: BP 129/70  Pulse 81  Temp(Src) 98 F (36.7 C) (Oral)  Wt 190 lb (86.183 kg)  General: Alert and Oriented, No Acute Distress HEENT: Pupils equal, round, reactive to light. Conjunctivae clear.  External ears unremarkable, canals clear with intact TMs with appropriate landmarks.  Middle ear appears open without effusion. Pink inferior turbinates.  Moist mucous membranes, pharynx without inflammation nor  lesions.  Neck supple without palpable lymphadenopathy nor abnormal masses. Lungs: Clear to auscultation bilaterally, no wheezing/ronchi/rales.  Comfortable work of breathing. Good air movement. Cardiac: Regular rate and rhythm. Normal S1/S2.  No murmurs, rubs, nor gallops.   Extremities: No peripheral edema.  Strong peripheral pulses.  Mental Status: No depression, anxiety, nor agitation. Skin: Warm and dry.  Assessment & Plan: Kimberly Wade was seen today for f/u sinusitis?.  Diagnoses and associated orders for this visit:  Essential hypertension - hydrochlorothiazide (HYDRODIURIL) 25 MG tablet; One tablet by mouth every morning for blood pressure control.  Acute  maxillary sinusitis, recurrence not specified    Essential hypertension: Controlled continue hydrochlorothiazide along with lisinopril-hydrochlorothiazide   acute maxillary sinusitis: Resolved, cleared to go back to work starting Wednesday .time was taken to fill out FMLA paperwork in the presence of the patient.   25 minutes spent face-to-face during visit today of which at least 50% was counseling or coordinating care regarding: 1. Essential hypertension   2. Acute maxillary sinusitis, recurrence not specified       Return if symptoms worsen or fail to improve.

## 2014-06-05 ENCOUNTER — Encounter: Payer: Self-pay | Admitting: Obstetrics & Gynecology

## 2014-06-05 ENCOUNTER — Ambulatory Visit (INDEPENDENT_AMBULATORY_CARE_PROVIDER_SITE_OTHER): Payer: 59 | Admitting: Obstetrics & Gynecology

## 2014-06-05 VITALS — BP 113/70 | HR 72 | Resp 16 | Ht 66.0 in | Wt 195.0 lb

## 2014-06-05 DIAGNOSIS — Z975 Presence of (intrauterine) contraceptive device: Secondary | ICD-10-CM

## 2014-06-05 DIAGNOSIS — Z0000 Encounter for general adult medical examination without abnormal findings: Secondary | ICD-10-CM

## 2014-06-05 DIAGNOSIS — Z30432 Encounter for removal of intrauterine contraceptive device: Secondary | ICD-10-CM

## 2014-06-05 NOTE — Progress Notes (Signed)
   Subjective:    Patient ID: Kimberly Wade, female    DOB: [birth year], 43 y.o.   MRN: [id number]  HPI 43 yo separated from husband, living with fiance. She is here today to have her expired Mirena removed. She has periods. It has been in for 7-8 years.    Review of Systems She is due for a pap and mammogram. She lives with her fiance and their blended kids ages 58, 19, and 18. They also have 3 others who live outside of their home. She has had 4 previous c/s and PTD with a child that did not live long.    Objective:   Physical Exam  UPT was negative and the consent form was signed. I placed a speculum but was unable to see the strings. (She tells me that she had a CT and the IUD was visualized not long ago.) I used the uterine dressing forceps and the IUD hook but was still unable to get the strings.       Assessment & Plan:  Need for replacement of Mirena. I will send Kimberly Wade an email to schedule her for a removal in the OR. I have recommended that she use condoms prn.  Schedule mammogram RTC 4 weeks for annual

## 2014-06-14 ENCOUNTER — Encounter (HOSPITAL_COMMUNITY): Payer: Self-pay

## 2014-06-14 ENCOUNTER — Encounter (HOSPITAL_COMMUNITY)
Admission: RE | Admit: 2014-06-14 | Discharge: 2014-06-14 | Disposition: A | Discharge status: Home / Self Care | Payer: 59 | Source: Ambulatory Visit | Attending: Obstetrics & Gynecology | Admitting: Obstetrics & Gynecology

## 2014-06-14 DIAGNOSIS — Z01812 Encounter for preprocedural laboratory examination: Secondary | ICD-10-CM | POA: Diagnosis present

## 2014-06-14 LAB — CBC
HCT: 35.9 % — ABNORMAL LOW (ref 36.0–46.0)
HEMOGLOBIN: 12 g/dL (ref 12.0–15.0)
MCH: 30.4 pg (ref 26.0–34.0)
MCHC: 33.4 g/dL (ref 30.0–36.0)
MCV: 90.9 fL (ref 78.0–100.0)
PLATELETS: 195 10*3/uL (ref 150–400)
RBC: 3.95 MIL/uL (ref 3.87–5.11)
RDW: 13.6 % (ref 11.5–15.5)
WBC: 5.8 10*3/uL (ref 4.0–10.5)

## 2014-06-14 LAB — BASIC METABOLIC PANEL
ANION GAP: 12 (ref 5–15)
BUN: 14 mg/dL (ref 6–23)
CALCIUM: 8.7 mg/dL (ref 8.4–10.5)
CO2: 26 mEq/L (ref 19–32)
Chloride: 101 mEq/L (ref 96–112)
Creatinine, Ser: 0.58 mg/dL (ref 0.50–1.10)
Glucose, Bld: 67 mg/dL — ABNORMAL LOW (ref 70–99)
Potassium: 3.3 mEq/L — ABNORMAL LOW (ref 3.7–5.3)
Sodium: 139 mEq/L (ref 137–147)

## 2014-06-14 NOTE — Patient Instructions (Signed)
   Your procedure is scheduled on: Jun 22 2014 AT 1250  Enter through the Main Entrance of [facility] at: [address] [place] up the phone at the desk and dial [phone] and inform us of your arrival.  Please call this number if you have any problems the morning of surgery: [phone]  Remember: Do not eat food after midnight:NOV 4 Do not drink clear liquids after: 9AM ON NOV 5 Take these medicines the morning of surgery with a SIP OF WATER:  Do not wear jewelry, make-up, or FINGER nail polish No metal in your hair or on your body. Do not wear lotions, powders, perfumes.  You may wear deodorant.  Do not bring valuables to the hospital. Contacts, dentures or bridgework may not be worn into surgery.  Leave suitcase in the car. After Surgery it may be brought to your room. For patients being admitted to the hospital, checkout time is 11:00am the day of discharge.    Patients discharged on the day of surgery will not be allowed to drive home.

## 2014-06-19 ENCOUNTER — Encounter (HOSPITAL_COMMUNITY): Payer: Self-pay

## 2014-06-22 ENCOUNTER — Ambulatory Visit (HOSPITAL_COMMUNITY): Admission: RE | Admit: 2014-06-22 | Payer: 59 | Source: Ambulatory Visit | Admitting: Obstetrics & Gynecology

## 2014-06-22 ENCOUNTER — Encounter (HOSPITAL_COMMUNITY): Admission: RE | Payer: Self-pay | Source: Ambulatory Visit

## 2014-06-22 SURGERY — REMOVAL, INTRAUTERINE DEVICE
Anesthesia: General

## 2014-07-11 ENCOUNTER — Ambulatory Visit (HOSPITAL_COMMUNITY)
Admission: RE | Admit: 2014-07-11 | Discharge: 2014-07-11 | Disposition: A | Discharge status: Home / Self Care | Payer: 59 | Source: Ambulatory Visit | Attending: Obstetrics & Gynecology | Admitting: Obstetrics & Gynecology

## 2014-07-11 ENCOUNTER — Ambulatory Visit (HOSPITAL_COMMUNITY): Payer: 59 | Admitting: Anesthesiology

## 2014-07-11 ENCOUNTER — Encounter (HOSPITAL_COMMUNITY): Payer: Self-pay

## 2014-07-11 ENCOUNTER — Encounter (HOSPITAL_COMMUNITY)
Admission: RE | Disposition: A | Discharge status: Home / Self Care | Payer: Self-pay | Source: Ambulatory Visit | Attending: Obstetrics & Gynecology

## 2014-07-11 DIAGNOSIS — Z6830 Body mass index (BMI) 30.0-30.9, adult: Secondary | ICD-10-CM | POA: Insufficient documentation

## 2014-07-11 DIAGNOSIS — E119 Type 2 diabetes mellitus without complications: Secondary | ICD-10-CM | POA: Insufficient documentation

## 2014-07-11 DIAGNOSIS — Z30432 Encounter for removal of intrauterine contraceptive device: Secondary | ICD-10-CM | POA: Diagnosis not present

## 2014-07-11 DIAGNOSIS — Z30019 Encounter for initial prescription of contraceptives, unspecified: Secondary | ICD-10-CM

## 2014-07-11 DIAGNOSIS — E669 Obesity, unspecified: Secondary | ICD-10-CM | POA: Insufficient documentation

## 2014-07-11 DIAGNOSIS — I10 Essential (primary) hypertension: Secondary | ICD-10-CM | POA: Insufficient documentation

## 2014-07-11 DIAGNOSIS — G4700 Insomnia, unspecified: Secondary | ICD-10-CM | POA: Insufficient documentation

## 2014-07-11 DIAGNOSIS — E785 Hyperlipidemia, unspecified: Secondary | ICD-10-CM | POA: Diagnosis not present

## 2014-07-11 DIAGNOSIS — K219 Gastro-esophageal reflux disease without esophagitis: Secondary | ICD-10-CM | POA: Insufficient documentation

## 2014-07-11 DIAGNOSIS — Z79899 Other long term (current) drug therapy: Secondary | ICD-10-CM | POA: Insufficient documentation

## 2014-07-11 DIAGNOSIS — Z833 Family history of diabetes mellitus: Secondary | ICD-10-CM | POA: Diagnosis not present

## 2014-07-11 DIAGNOSIS — Z8249 Family history of ischemic heart disease and other diseases of the circulatory system: Secondary | ICD-10-CM | POA: Insufficient documentation

## 2014-07-11 HISTORY — PX: IUD REMOVAL: SHX5392

## 2014-07-11 HISTORY — PX: INTRAUTERINE DEVICE (IUD) INSERTION: SHX5877

## 2014-07-11 LAB — PREGNANCY, URINE: Preg Test, Ur: NEGATIVE

## 2014-07-11 SURGERY — REMOVAL, INTRAUTERINE DEVICE
Anesthesia: General | Site: Vagina

## 2014-07-11 MED ORDER — KETOROLAC TROMETHAMINE 30 MG/ML IJ SOLN: 15.0000 mg | Freq: Once | INTRAMUSCULAR | Status: DC | PRN

## 2014-07-11 MED ORDER — SCOPOLAMINE 1 MG/3DAYS TD PT72
1.0000 | MEDICATED_PATCH | Freq: Once | TRANSDERMAL | Status: DC
  Administered 2014-07-11: 1.5 mg via TRANSDERMAL

## 2014-07-11 MED ORDER — ONDANSETRON HCL 4 MG/2ML IJ SOLN: 4.0000 mg | Freq: Once | INTRAMUSCULAR | Status: DC | PRN

## 2014-07-11 MED ORDER — DEXAMETHASONE SODIUM PHOSPHATE 4 MG/ML IJ SOLN
INTRAMUSCULAR | Status: AC
  Filled 2014-07-11: qty 1

## 2014-07-11 MED ORDER — PROPOFOL 10 MG/ML IV EMUL
INTRAVENOUS | Status: AC
  Filled 2014-07-11: qty 20

## 2014-07-11 MED ORDER — 0.9 % SODIUM CHLORIDE (POUR BTL) OPTIME
TOPICAL | Status: DC | PRN
  Administered 2014-07-11: 1000 mL

## 2014-07-11 MED ORDER — MEPERIDINE HCL 25 MG/ML IJ SOLN: 6.2500 mg | INTRAMUSCULAR | Status: DC | PRN

## 2014-07-11 MED ORDER — FENTANYL CITRATE 0.05 MG/ML IJ SOLN
INTRAMUSCULAR | Status: DC | PRN
  Administered 2014-07-11 (×2): 50 ug via INTRAVENOUS

## 2014-07-11 MED ORDER — FENTANYL CITRATE 0.05 MG/ML IJ SOLN
INTRAMUSCULAR | Status: AC
  Filled 2014-07-11: qty 2

## 2014-07-11 MED ORDER — ONDANSETRON HCL 4 MG/2ML IJ SOLN
INTRAMUSCULAR | Status: AC
  Filled 2014-07-11: qty 2

## 2014-07-11 MED ORDER — BUPIVACAINE HCL (PF) 0.5 % IJ SOLN
INTRAMUSCULAR | Status: AC
  Filled 2014-07-11: qty 30

## 2014-07-11 MED ORDER — DEXAMETHASONE SODIUM PHOSPHATE 4 MG/ML IJ SOLN
INTRAMUSCULAR | Status: DC | PRN
  Administered 2014-07-11: 4 mg via INTRAVENOUS

## 2014-07-11 MED ORDER — MIDAZOLAM HCL 2 MG/2ML IJ SOLN
INTRAMUSCULAR | Status: AC
  Filled 2014-07-11: qty 2

## 2014-07-11 MED ORDER — PROPOFOL 10 MG/ML IV BOLUS
INTRAVENOUS | Status: DC | PRN
  Administered 2014-07-11: 200 mg via INTRAVENOUS

## 2014-07-11 MED ORDER — FENTANYL CITRATE 0.05 MG/ML IJ SOLN
25.0000 ug | INTRAMUSCULAR | Status: DC | PRN
  Administered 2014-07-11 (×2): 50 ug via INTRAVENOUS

## 2014-07-11 MED ORDER — MIDAZOLAM HCL 2 MG/2ML IJ SOLN
INTRAMUSCULAR | Status: DC | PRN
  Administered 2014-07-11: 2 mg via INTRAVENOUS

## 2014-07-11 MED ORDER — LIDOCAINE HCL (CARDIAC) 20 MG/ML IV SOLN
INTRAVENOUS | Status: AC
  Filled 2014-07-11: qty 5

## 2014-07-11 MED ORDER — LACTATED RINGERS IV SOLN
INTRAVENOUS | Status: DC
  Administered 2014-07-11: 12:00:00 via INTRAVENOUS
  Administered 2014-07-11: 50 mL/h via INTRAVENOUS

## 2014-07-11 MED ORDER — SCOPOLAMINE 1 MG/3DAYS TD PT72
MEDICATED_PATCH | TRANSDERMAL | Status: AC
  Filled 2014-07-11: qty 1

## 2014-07-11 MED ORDER — ONDANSETRON HCL 4 MG/2ML IJ SOLN
INTRAMUSCULAR | Status: DC | PRN
  Administered 2014-07-11: 4 mg via INTRAVENOUS

## 2014-07-11 MED ORDER — FENTANYL CITRATE 0.05 MG/ML IJ SOLN
INTRAMUSCULAR | Status: AC
Start: 2014-07-11 — End: 2014-07-11
  Filled 2014-07-11: qty 2

## 2014-07-11 MED ORDER — KETOROLAC TROMETHAMINE 30 MG/ML IJ SOLN
INTRAMUSCULAR | Status: AC
  Filled 2014-07-11: qty 1

## 2014-07-11 MED ORDER — LIDOCAINE HCL (CARDIAC) 20 MG/ML IV SOLN
INTRAVENOUS | Status: DC | PRN
  Administered 2014-07-11: 75 mg via INTRAVENOUS

## 2014-07-11 SURGICAL SUPPLY — 10 items
CATH ROBINSON RED A/P 16FR (CATHETERS) ×4 IMPLANT
CLOTH BEACON ORANGE TIMEOUT ST (SAFETY) ×4 IMPLANT
GLOVE BIO SURGEON STRL SZ 6.5 (GLOVE) ×3 IMPLANT
GLOVE BIO SURGEONS STRL SZ 6.5 (GLOVE) ×1
GOWN STRL REUS W/TWL LRG LVL3 (GOWN DISPOSABLE) ×8 IMPLANT
NEEDLE SPNL 22GX3.5 QUINCKE BK (NEEDLE) ×4 IMPLANT
PACK VAGINAL MINOR WOMEN LF (CUSTOM PROCEDURE TRAY) ×4 IMPLANT
PAD PREP 24X48 CUFFED NSTRL (MISCELLANEOUS) ×4 IMPLANT
TOWEL OR 17X24 6PK STRL BLUE (TOWEL DISPOSABLE) ×8 IMPLANT
WATER STERILE IRR 1000ML POUR (IV SOLUTION) ×4 IMPLANT

## 2014-07-11 NOTE — Anesthesia Postprocedure Evaluation (Signed)
Anesthesia Post Note  Patient: Kimberly Wade  Procedure(s) Performed: Procedure(s) (LRB): INTRAUTERINE DEVICE (IUD) REMOVAL (N/A) INTRAUTERINE DEVICE (IUD) INSERTION (N/A)  Anesthesia type: General  Patient location: PACU  Post pain: Pain level controlled  Post assessment: Post-op Vital signs reviewed  Last Vitals:  Filed Vitals:   07/11/14 1245  BP: 115/61  Pulse: 66  Temp:   Resp: 15    Post vital signs: Reviewed  Level of consciousness: sedated  Complications: No apparent anesthesia complications

## 2014-07-11 NOTE — Op Note (Addendum)
07/11/2014  11:56 AM  PATIENT:  Kimberly Wade  43 y.o. female  PRE-OPERATIVE DIAGNOSIS:  Retained IUD, desire for Nexplanon  POST-OPERATIVE DIAGNOSIS:  Removal of retained IUD and placement of Nexplanon  PROCEDURE:  Removal of Mirena, placement of Nexplanon  SURGEON:  Surgeon(s) and Role:    Emily Filbert, MD    ANESTHESIA:   general  EBL:  Total I/O In: 1000 [I.V.:1000] Out: -   BLOOD ADMINISTERED:none  DRAINS: none   LOCAL MEDICATIONS USED:  NONE  SPECIMEN:  No Specimen  DISPOSITION OF SPECIMEN:  N/A  COUNTS:  YES  TOURNIQUET:  * No tourniquets in log *  DICTATION: .Dragon Dictation  PLAN OF CARE: Discharge to home after PACU  PATIENT DISPOSITION:  PACU - hemodynamically stable.   Delay start of Pharmacological VTE agent (>24hrs) due to surgical blood loss or risk of bleeding: not applicable General anesthesia was given without complication. UPT was negative. Consent was signed. Time out procedure was done. Her left arm was prepped with betadine and the Nexplanon device was placed according to standard of care. Her arm was hemostatic and was bandaged.  Cervix prepped with betadine and grasped with a single tooth tenaculum. I dilated the cervix to allow placement of uterine dressing forceps. The IUD was grasped and easily removed.  She was extubated and taken to the recovery room in stable condition.

## 2014-07-11 NOTE — Transfer of Care (Signed)
Immediate Anesthesia Transfer of Care Note  Patient: Kimberly Wade  Procedure(s) Performed: Procedure(s): INTRAUTERINE DEVICE (IUD) REMOVAL (N/A) INTRAUTERINE DEVICE (IUD) INSERTION (N/A)  Patient Location: PACU  Anesthesia Type:General  Level of Consciousness: awake, alert  and oriented  Airway & Oxygen Therapy: Patient Spontanous Breathing and Patient connected to nasal cannula oxygen  Post-op Assessment: Report given to PACU RN and Post -op Vital signs reviewed and stable  Post vital signs: Reviewed and stable  Complications: No apparent anesthesia complications

## 2014-07-11 NOTE — OR Nursing (Signed)
Nexplanon implant per Dr. Hulan Fray from implant patient brought with her.  Nexplanon 68 mg. Exp. 10/2016 and lot number [id number].

## 2014-07-11 NOTE — Anesthesia Preprocedure Evaluation (Signed)
Anesthesia Evaluation  Patient identified by MRN, date of birth, ID band Patient awake    Reviewed: Allergy & Precautions, H&P , NPO status , Patient's Chart, lab work & pertinent test results  Airway Mallampati: II  TM Distance: >3 FB Neck ROM: full    Dental no notable dental hx. (+) Teeth Intact   Pulmonary    Pulmonary exam normal       Cardiovascular hypertension, Pt. on medications     Neuro/Psych    GI/Hepatic negative GI ROS, Neg liver ROS,   Endo/Other  diabetes  Renal/GU negative Renal ROS     Musculoskeletal   Abdominal (+) + obese,   Peds  Hematology negative hematology ROS (+)   Anesthesia Other Findings   Reproductive/Obstetrics negative OB ROS                             Anesthesia Physical Anesthesia Plan  ASA: II  Anesthesia Plan: General   Post-op Pain Management:    Induction: Intravenous  Airway Management Planned: LMA  Additional Equipment:   Intra-op Plan:   Post-operative Plan:   Informed Consent: I have reviewed the patients History and Physical, chart, labs and discussed the procedure including the risks, benefits and alternatives for the proposed anesthesia with the patient or authorized representative who has indicated his/her understanding and acceptance.     Plan Discussed with: CRNA and Surgeon  Anesthesia Plan Comments:         Anesthesia Quick Evaluation

## 2014-07-11 NOTE — Discharge Instructions (Addendum)
DISCHARGE INSTRUCTIONS: D&C / D&E The following instructions have been prepared to help you care for yourself upon your return home.   Personal hygiene:  Use sanitary pads for vaginal drainage, not tampons.  Shower the day after your procedure.  NO tub baths, pools or Jacuzzis for 2-3 weeks.  Wipe front to back after using the bathroom.  Activity and limitations:  Do NOT drive or operate any equipment for 24 hours. The effects of anesthesia are still present and drowsiness may result.  Do NOT rest in bed all day.  Walking is encouraged.  Walk up and down stairs slowly.  You may resume your normal activity in one to two days or as indicated by your physician.  Sexual activity: NO intercourse for at least 2 weeks after the procedure, or as indicated by your physician.  Diet: Eat a light meal as desired this evening. You may resume your usual diet tomorrow.  Return to work: You may resume your work activities in one to two days or as indicated by your doctor.  What to expect after your surgery: Expect to have vaginal bleeding/discharge for 2-3 days and spotting for up to 10 days. It is not unusual to have soreness for up to 1-2 weeks. You may have a slight burning sensation when you urinate for the first day. Mild cramps may continue for a couple of days. You may have a regular period in 2-6 weeks.  Call your doctor for any of the following:  Excessive vaginal bleeding, saturating and changing one pad every hour.  Inability to urinate 6 hours after discharge from hospital.  Pain not relieved by pain medication.  Fever of 100.4 F or greater.  Unusual vaginal discharge or odor.   Call for an appointment:    Patients signature: ______________________  Nurses signature ________________________  Support person's signature_______________________   MONITOR FOR SIGNS AND SYMPTOMS OF INFECTION IN LEFT ARM.  YOU MAY TAKE OFF ACE BANDAGE AND GAUZE OFF LEFT ARM IN 24  HOURS.   PATIENT MAY RETURN TO WORK ON Tuesday December 1ST.

## 2014-07-11 NOTE — H&P (Signed)
Expand All Collapse All     Subjective:    Patient ID: Kimberly Wade, female DOB: 27-May-1971, 43 y.o. MRN: [id number]  HPI 43 yo separated from husband, living with fiance. She is here today to have her expired Mirena removed. She has periods. It has been in for 7-8 years. I tried in the office to remove it but was unable to. She would like to have a Nexplanon placed for contraception. She is aware of the side effect of irregular bleeding.        No LMP recorded.    Past Medical History  Diagnosis Date  . Anxiety   . GERD (gastroesophageal reflux disease)   . Insomnia   . Pneumonia   . Hypertension   . Hyperlipidemia   . Pneumococcal pneumonia 2000  . Diabetes     States after gastric Bypass Not a diabetic now    Past Surgical History  Procedure Laterality Date  . Cesarean section      x4  . Chest tube insertion    . Gastric bypass    . Dysplastic mole removed      right ankle  . Chest tubes  2000  . Recoonstruction of right lung    . Mini gastric bypass  2011  . Video bronchoscopy Bilateral 04/20/2013    Procedure: VIDEO BRONCHOSCOPY WITH FLUORO;  Surgeon: Elsie Stain, MD;  Location: [facility];  Service: Cardiopulmonary;  Laterality: Bilateral;    Family History  Problem Relation Age of Onset  . Colon cancer      grandmother  . Heart attack      grandfather  . Diabetes Father     father  . Hypertension Father   . Hyperlipidemia    . Hypertension Mother   . Asthma Daughter     Social History:  reports that she has never smoked. She has never used smokeless tobacco. She reports that she drinks alcohol. She reports that she does not use illicit drugs.  Allergies:  Allergies  Allergen Reactions  . Levaquin [Levofloxacin In D5w] Hives    Has tolerated avelox  . Penicillins Hives    Prescriptions prior to admission  Medication Sig Dispense Refill Last Dose  . ALPRAZolam (XANAX) 1 MG tablet Take 1 tablet (1 mg total) by mouth 3 (three)  times daily as needed for sleep. 90 tablet 0 Past Month at Unknown time  . Biotin 5000 MCG TABS Take 1 tablet by mouth 2 (two) times daily.   Past Week at Unknown time  . Calcium Citrate-Vitamin D (CALCIUM CITRATE + PO) Take 1 tablet by mouth daily.    07/10/2014 at 0800  . ferrous sulfate 325 (65 FE) MG tablet Take 1 tablet (325 mg total) by mouth 2 (two) times daily with a meal.   Past Week at Unknown time  . hydrochlorothiazide (HYDRODIURIL) 25 MG tablet One tablet by mouth every morning for blood pressure control. 30 tablet 2 07/10/2014 at 0800  . HYDROcodone-acetaminophen (NORCO/VICODIN) 5-325 MG per tablet Take 1 tablet by mouth daily as needed for moderate pain or severe pain.   Past Week at Unknown time  . levocetirizine (XYZAL) 5 MG tablet Take 5 mg by mouth every evening.   Past Month at Unknown time  . lisinopril-hydrochlorothiazide (ZESTORETIC) 20-12.5 MG per tablet Take 1 tablet by mouth daily. 90 tablet 3 07/11/2014 at 0700  . Multiple Vitamin (MULTIVITAMIN) tablet Take 1 tablet by mouth 3 (three) times daily.   07/10/2014 at 0800  .  omeprazole (PRILOSEC) 40 MG capsule Take 40 mg by mouth daily as needed (heartburn).   07/11/2014 at 0700  . traZODone (DESYREL) 100 MG tablet TAKE 1-2 TABS AT BEDTIME ONLY AS NEEDED. 90 tablet 3 07/10/2014 at 2300  . Vitamin D, Ergocalciferol, (DRISDOL) 50000 UNITS CAPS capsule Take 1 capsule (50,000 Units total) by mouth every 7 (seven) days. 4 capsule 11 Past Week at Unknown time  . levonorgestrel (MIRENA) 20 MCG/24HR IUD 1 each by Intrauterine route once.   More than a month at Unknown time    ROS  Blood pressure 116/69, pulse 57, temperature 98.1 F (36.7 C), temperature source Oral, resp. rate 16, height 5\' 6"  (1.676 m), weight 84.369 kg (186 lb), SpO2 100 %. Physical Exam  Heart- rrr Lungs- CTAB Abd- benign, obese  No results found for this or any previous visit (from the past 24 hour(s)).  No results found.  Assessment/Plan: Retained  IUD (expired). I will remove it under anesthesia and place a Nexplanon for contraception.  Kimberly Wade C. 07/11/2014, 10:09 AM

## 2014-07-12 ENCOUNTER — Encounter (HOSPITAL_COMMUNITY): Payer: Self-pay | Admitting: Obstetrics & Gynecology

## 2014-07-24 ENCOUNTER — Encounter (HOSPITAL_BASED_OUTPATIENT_CLINIC_OR_DEPARTMENT_OTHER): Payer: Self-pay | Admitting: Emergency Medicine

## 2014-07-24 ENCOUNTER — Emergency Department (HOSPITAL_BASED_OUTPATIENT_CLINIC_OR_DEPARTMENT_OTHER)
Admission: EM | Admit: 2014-07-24 | Discharge: 2014-07-24 | Disposition: A | Discharge status: Home / Self Care | Payer: 59 | Attending: Emergency Medicine | Admitting: Emergency Medicine

## 2014-07-24 DIAGNOSIS — I10 Essential (primary) hypertension: Secondary | ICD-10-CM | POA: Insufficient documentation

## 2014-07-24 DIAGNOSIS — Z8701 Personal history of pneumonia (recurrent): Secondary | ICD-10-CM | POA: Diagnosis not present

## 2014-07-24 DIAGNOSIS — R1032 Left lower quadrant pain: Secondary | ICD-10-CM | POA: Diagnosis not present

## 2014-07-24 DIAGNOSIS — N938 Other specified abnormal uterine and vaginal bleeding: Secondary | ICD-10-CM | POA: Diagnosis not present

## 2014-07-24 DIAGNOSIS — Z3202 Encounter for pregnancy test, result negative: Secondary | ICD-10-CM | POA: Diagnosis not present

## 2014-07-24 DIAGNOSIS — Z880 Allergy status to penicillin: Secondary | ICD-10-CM | POA: Insufficient documentation

## 2014-07-24 DIAGNOSIS — E119 Type 2 diabetes mellitus without complications: Secondary | ICD-10-CM | POA: Insufficient documentation

## 2014-07-24 DIAGNOSIS — Z9889 Other specified postprocedural states: Secondary | ICD-10-CM | POA: Insufficient documentation

## 2014-07-24 DIAGNOSIS — F419 Anxiety disorder, unspecified: Secondary | ICD-10-CM | POA: Diagnosis not present

## 2014-07-24 DIAGNOSIS — Z79899 Other long term (current) drug therapy: Secondary | ICD-10-CM | POA: Insufficient documentation

## 2014-07-24 DIAGNOSIS — Z9884 Bariatric surgery status: Secondary | ICD-10-CM | POA: Diagnosis not present

## 2014-07-24 DIAGNOSIS — Z8669 Personal history of other diseases of the nervous system and sense organs: Secondary | ICD-10-CM | POA: Diagnosis not present

## 2014-07-24 DIAGNOSIS — R109 Unspecified abdominal pain: Secondary | ICD-10-CM | POA: Diagnosis present

## 2014-07-24 LAB — COMPREHENSIVE METABOLIC PANEL
ALT: 15 U/L (ref 0–35)
ANION GAP: 15 (ref 5–15)
AST: 15 U/L (ref 0–37)
Albumin: 3.3 g/dL — ABNORMAL LOW (ref 3.5–5.2)
Alkaline Phosphatase: 66 U/L (ref 39–117)
BUN: 11 mg/dL (ref 6–23)
CALCIUM: 8.3 mg/dL — AB (ref 8.4–10.5)
CO2: 19 meq/L (ref 19–32)
CREATININE: 0.6 mg/dL (ref 0.50–1.10)
Chloride: 109 mEq/L (ref 96–112)
GFR calc Af Amer: >90 mL/min (ref >90)
GFR calc non Af Amer: >90 mL/min (ref >90)
GLUCOSE: 116 mg/dL — AB (ref 70–99)
Potassium: 4.1 mEq/L (ref 3.7–5.3)
SODIUM: 143 meq/L (ref 137–147)
TOTAL PROTEIN: 6.9 g/dL (ref 6.0–8.3)
Total Bilirubin: 0.2 mg/dL — ABNORMAL LOW (ref 0.3–1.2)

## 2014-07-24 LAB — URINALYSIS, ROUTINE W REFLEX MICROSCOPIC
Bilirubin Urine: NEGATIVE
Glucose, UA: NEGATIVE mg/dL
Ketones, ur: NEGATIVE mg/dL
Leukocytes, UA: NEGATIVE
NITRITE: NEGATIVE
PROTEIN: NEGATIVE mg/dL
SPECIFIC GRAVITY, URINE: 1.008 (ref 1.005–1.030)
UROBILINOGEN UA: 0.2 mg/dL (ref 0.0–1.0)
pH: 6 (ref 5.0–8.0)

## 2014-07-24 LAB — CBC WITH DIFFERENTIAL/PLATELET
Basophils Absolute: 0 10*3/uL (ref 0.0–0.1)
Basophils Relative: 0 % (ref 0–1)
EOS ABS: 0.2 10*3/uL (ref 0.0–0.7)
EOS PCT: 3 % (ref 0–5)
HEMATOCRIT: 31.9 % — AB (ref 36.0–46.0)
Hemoglobin: 10.4 g/dL — ABNORMAL LOW (ref 12.0–15.0)
LYMPHS PCT: 26 % (ref 12–46)
Lymphs Abs: 1.7 10*3/uL (ref 0.7–4.0)
MCH: 30.1 pg (ref 26.0–34.0)
MCHC: 32.6 g/dL (ref 30.0–36.0)
MCV: 92.2 fL (ref 78.0–100.0)
MONOS PCT: 9 % (ref 3–12)
Monocytes Absolute: 0.6 10*3/uL (ref 0.1–1.0)
Neutro Abs: 4.2 10*3/uL (ref 1.7–7.7)
Neutrophils Relative %: 62 % (ref 43–77)
PLATELETS: 192 10*3/uL (ref 150–400)
RBC: 3.46 MIL/uL — ABNORMAL LOW (ref 3.87–5.11)
RDW: 12.3 % (ref 11.5–15.5)
WBC: 6.7 10*3/uL (ref 4.0–10.5)

## 2014-07-24 LAB — PREGNANCY, URINE: Preg Test, Ur: NEGATIVE

## 2014-07-24 LAB — URINE MICROSCOPIC-ADD ON

## 2014-07-24 LAB — WET PREP, GENITAL
Clue Cells Wet Prep HPF POC: NONE SEEN
Trich, Wet Prep: NONE SEEN
Yeast Wet Prep HPF POC: NONE SEEN

## 2014-07-24 MED ORDER — ALBUTEROL SULFATE (2.5 MG/3ML) 0.083% IN NEBU
INHALATION_SOLUTION | RESPIRATORY_TRACT | Status: AC
  Filled 2014-07-24: qty 6

## 2014-07-24 NOTE — ED Provider Notes (Signed)
CSN: [id number]     Arrival date & time 07/24/14  1624 History  This chart was scribed for Artis Delay, MD by Molli Posey, ED Scribe. This patient was seen in room MH07/MH07 and the patient's care was started 4:50 PM.      Chief Complaint  Patient presents with  . Flank Pain   Patient is a 43 y.o. female presenting with flank pain.  Flank Pain The current episode started 3 to 5 hours ago. The problem occurs constantly. The problem has been gradually improving. Associated symptoms include abdominal pain. Nothing aggravates the symptoms. Nothing relieves the symptoms. She has tried nothing for the symptoms.   HPI Comments: Kimberly Wade is a 43 y.o. female who presents to the Emergency Department complaining of constant, gradually worsening severe lower left abdominal pain that started at 1PM today. Pt reports her pain has improved since onset. She states she was sitting at work when her pain worsened and says she began to sweat. Pt reports no other associated symptoms currently. She reports a history of gas retention in the past and thinks this may be attributing to her current pain. Pt states she had a regular BM this morning. Pt reports a history of kidney stones but states her current pain is different in both quality and location to her prior kidney stone episodes. Pt had a intrauterine device removal on 11/28. She reports associated minimal vaginal bleeding since that time. She denies fever or any urinary symptoms.     Past Medical History  Diagnosis Date  . Anxiety   . GERD (gastroesophageal reflux disease)   . Insomnia   . Pneumonia   . Hypertension   . Hyperlipidemia   . Pneumococcal pneumonia 2000  . Diabetes     States after gastric Bypass Not a diabetic now   Past Surgical History  Procedure Laterality Date  . Cesarean section      x4  . Chest tube insertion    . Gastric bypass    . Dysplastic mole removed      right ankle  . Chest tubes  2000  . Recoonstruction of  right lung    . Mini gastric bypass  2011  . Video bronchoscopy Bilateral 04/20/2013    Procedure: VIDEO BRONCHOSCOPY WITH FLUORO;  Surgeon: Elsie Stain, MD;  Location: [facility];  Service: Cardiopulmonary;  Laterality: Bilateral;  . Iud removal N/A 07/11/2014    Procedure: INTRAUTERINE DEVICE (IUD) REMOVAL;  Surgeon: Emily Filbert, MD;  Location: [facility];  Service: Gynecology;  Laterality: N/A;  . Intrauterine device (iud) insertion N/A 07/11/2014    Procedure: INTRAUTERINE DEVICE (IUD) INSERTION;  Surgeon: Emily Filbert, MD;  Location: [facility];  Service: Gynecology;  Laterality: N/A;   Family History  Problem Relation Age of Onset  . Colon cancer      grandmother  . Heart attack      grandfather  . Diabetes Father     father  . Hypertension Father   . Hyperlipidemia    . Hypertension Mother   . Asthma Daughter    History  Substance Use Topics  . Smoking status: Never Smoker   . Smokeless tobacco: Never Used  . Alcohol Use: Yes     Comment: 1 glass per month   OB History    Gravida Para Term Preterm AB TAB SAB Ectopic Multiple Living   5 4  4      3      Review of Systems  Constitutional:  Positive for diaphoresis. Negative for fever.  Gastrointestinal: Positive for abdominal pain. Negative for nausea and vomiting.  Genitourinary: Positive for flank pain and vaginal bleeding. Negative for dysuria, frequency and vaginal discharge.  All other systems reviewed and are negative.     Allergies  Levaquin and Penicillins  Home Medications   Prior to Admission medications   Medication Sig Start Date End Date Taking? Authorizing Provider  ALPRAZolam Duanne Moron) 1 MG tablet Take 1 tablet (1 mg total) by mouth 3 (three) times daily as needed for sleep. 04/04/14   Marcial Pacas, DO  Biotin 5000 MCG TABS Take 1 tablet by mouth 2 (two) times daily.    Historical Provider, MD  Calcium Citrate-Vitamin D (CALCIUM CITRATE + PO) Take 1 tablet by mouth daily.     Historical Provider, MD   ferrous sulfate 325 (65 FE) MG tablet Take 1 tablet (325 mg total) by mouth 2 (two) times daily with a meal. 04/12/14   Sean Hommel, DO  hydrochlorothiazide (HYDRODIURIL) 25 MG tablet One tablet by mouth every morning for blood pressure control. 05/29/14 05/29/15  Marcial Pacas, DO  HYDROcodone-acetaminophen (NORCO/VICODIN) 5-325 MG per tablet Take 1 tablet by mouth daily as needed for moderate pain or severe pain.    Historical Provider, MD  levocetirizine (XYZAL) 5 MG tablet Take 5 mg by mouth every evening.    Historical Provider, MD  levonorgestrel (MIRENA) 20 MCG/24HR IUD 1 each by Intrauterine route once.    Historical Provider, MD  lisinopril-hydrochlorothiazide (ZESTORETIC) 20-12.5 MG per tablet Take 1 tablet by mouth daily. 04/04/14   Marcial Pacas, DO  Multiple Vitamin (MULTIVITAMIN) tablet Take 1 tablet by mouth 3 (three) times daily.    Historical Provider, MD  omeprazole (PRILOSEC) 40 MG capsule Take 40 mg by mouth daily as needed (heartburn).    Historical Provider, MD  traZODone (DESYREL) 100 MG tablet TAKE 1-2 TABS AT BEDTIME ONLY AS NEEDED. 04/04/14   Marcial Pacas, DO  Vitamin D, Ergocalciferol, (DRISDOL) 50000 UNITS CAPS capsule Take 1 capsule (50,000 Units total) by mouth every 7 (seven) days. 04/04/14   Sean Hommel, DO   BP 154/79 mmHg  Pulse 80  Temp(Src) 97.9 F (36.6 C) (Oral)  Resp 18  Ht 5\' 6"  (1.676 m)  Wt 185 lb (83.915 kg)  BMI 29.87 kg/m2  SpO2 100% Physical Exam  Constitutional: She is oriented to person, place, and time. She appears well-developed and well-nourished. No distress.  HENT:  Head: Normocephalic and atraumatic.  Mouth/Throat: Oropharynx is clear and moist.  Eyes: Conjunctivae are normal. Pupils are equal, round, and reactive to light. No scleral icterus.  Neck: Neck supple.  Cardiovascular: Normal rate, regular rhythm, normal heart sounds and intact distal pulses.   No murmur heard. Pulmonary/Chest: Effort normal and breath sounds normal. No stridor.  No respiratory distress. She has no rales.  Abdominal: Soft. Bowel sounds are normal. She exhibits no distension. There is tenderness in the left lower quadrant. There is no rigidity, no rebound and no guarding.    Genitourinary: Uterus is not enlarged and not tender. Cervix exhibits no motion tenderness, no discharge and no friability. Right adnexum displays no mass, no tenderness and no fullness. Left adnexum displays no mass, no tenderness and no fullness. There is bleeding in the vagina.  Musculoskeletal: Normal range of motion.  Neurological: She is alert and oriented to person, place, and time.  Skin: Skin is warm and dry. No rash noted.  Psychiatric: She has a normal mood and affect. Her  behavior is normal.  Nursing note and vitals reviewed.   ED Course  Procedures  DIAGNOSTIC STUDIES: Oxygen Saturation is 100% on RA, normal by my interpretation.    COORDINATION OF CARE: 4:58 PM Discussed treatment plan with pt at bedside and pt agreed to plan.   Labs Review Labs Reviewed  WET PREP, GENITAL - Abnormal; Notable for the following:    WBC, Wet Prep HPF POC FEW (*)    All other components within normal limits  CBC WITH DIFFERENTIAL - Abnormal; Notable for the following:    RBC 3.46 (*)    Hemoglobin 10.4 (*)    HCT 31.9 (*)    All other components within normal limits  COMPREHENSIVE METABOLIC PANEL - Abnormal; Notable for the following:    Glucose, Bld 116 (*)    Calcium 8.3 (*)    Albumin 3.3 (*)    Total Bilirubin 0.2 (*)    All other components within normal limits  URINALYSIS, ROUTINE W REFLEX MICROSCOPIC - Abnormal; Notable for the following:    Hgb urine dipstick MODERATE (*)    All other components within normal limits  GC/CHLAMYDIA PROBE AMP  PREGNANCY, URINE  URINE MICROSCOPIC-ADD ON    Imaging Review No results found.   EKG Interpretation None      MDM   Final diagnoses:  Abdominal pain, left lower quadrant    43 yo female presenting with LLQ  abdominal pain.  Started gradually but suddenly worsened.  She thinks it may be related to "gas pain" as she has had similar symptoms in the past. She reports that she is here primarily because her work made her come to be evaluated.  She recently had an uncomplicated IUD removal and has been having some spotting since.  Her GU exam was benign.  At time of pelvic exam, her abdominal exam had completely resolved and she had no abdominal tenderness on repeat exam.    7:39 PM repeat exam continues to be benign.  Pt well appearing.  Labs unremarkable.  Offered CT for undifferentiated abdominal pain.  However, pt states she feels better and prefers to avoid this.  Seems reasonable given her repeat abdominal exams.  I have a very low suspicion that her abdominal pain is related to her IUD removal procedure. She was given return precautions.    I personally performed the services described in this documentation, which was scribed in my presence. The recorded information has been reviewed and is accurate.      Artis Delay, MD 07/24/14 [phone]

## 2014-07-24 NOTE — Discharge Instructions (Signed)
Abdominal Pain, Women °Abdominal (stomach, pelvic, or belly) pain can be caused by many things. It is important to tell your doctor: °· The location of the pain. °· Does it come and go or is it present all the time? °· Are there things that start the pain (eating certain foods, exercise)? °· Are there other symptoms associated with the pain (fever, nausea, vomiting, diarrhea)? °All of this is helpful to know when trying to find the cause of the pain. °CAUSES  °· Stomach: virus or bacteria infection, or ulcer. °· Intestine: appendicitis (inflamed appendix), regional ileitis (Crohn's disease), ulcerative colitis (inflamed colon), irritable bowel syndrome, diverticulitis (inflamed diverticulum of the colon), or cancer of the stomach or intestine. °· Gallbladder disease or stones in the gallbladder. °· Kidney disease, kidney stones, or infection. °· Pancreas infection or cancer. °· Fibromyalgia (pain disorder). °· Diseases of the female organs: °¨ Uterus: fibroid (non-cancerous) tumors or infection. °¨ Fallopian tubes: infection or tubal pregnancy. °¨ Ovary: cysts or tumors. °¨ Pelvic adhesions (scar tissue). °¨ Endometriosis (uterus lining tissue growing in the pelvis and on the pelvic organs). °¨ Pelvic congestion syndrome (female organs filling up with blood just before the menstrual period). °¨ Pain with the menstrual period. °¨ Pain with ovulation (producing an egg). °¨ Pain with an IUD (intrauterine device, birth control) in the uterus. °¨ Cancer of the female organs. °· Functional pain (pain not caused by a disease, may improve without treatment). °· Psychological pain. °· Depression. °DIAGNOSIS  °Your doctor will decide the seriousness of your pain by doing an examination. °· Blood tests. °· X-rays. °· Ultrasound. °· CT scan (computed tomography, special type of X-ray). °· MRI (magnetic resonance imaging). °· Cultures, for infection. °· Barium enema (dye inserted in the large intestine, to better view it with  X-rays). °· Colonoscopy (looking in intestine with a lighted tube). °· Laparoscopy (minor surgery, looking in abdomen with a lighted tube). °· Major abdominal exploratory surgery (looking in abdomen with a large incision). °TREATMENT  °The treatment will depend on the cause of the pain.  °· Many cases can be observed and treated at home. °· Over-the-counter medicines recommended by your caregiver. °· Prescription medicine. °· Antibiotics, for infection. °· Birth control pills, for painful periods or for ovulation pain. °· Hormone treatment, for endometriosis. °· Nerve blocking injections. °· Physical therapy. °· Antidepressants. °· Counseling with a psychologist or psychiatrist. °· Minor or major surgery. °HOME CARE INSTRUCTIONS  °· Do not take laxatives, unless directed by your caregiver. °· Take over-the-counter pain medicine only if ordered by your caregiver. Do not take aspirin because it can cause an upset stomach or bleeding. °· Try a clear liquid diet (broth or water) as ordered by your caregiver. Slowly move to a bland diet, as tolerated, if the pain is related to the stomach or intestine. °· Have a thermometer and take your temperature several times a day, and record it. °· Bed rest and sleep, if it helps the pain. °· Avoid sexual intercourse, if it causes pain. °· Avoid stressful situations. °· Keep your follow-up appointments and tests, as your caregiver orders. °· If the pain does not go away with medicine or surgery, you may try: °¨ Acupuncture. °¨ Relaxation exercises (yoga, meditation). °¨ Group therapy. °¨ Counseling. °SEEK MEDICAL CARE IF:  °· You notice certain foods cause stomach pain. °· Your home care treatment is not helping your pain. °· You need stronger pain medicine. °· You want your IUD removed. °· You feel faint or   lightheaded. °· You develop nausea and vomiting. °· You develop a rash. °· You are having side effects or an allergy to your medicine. °SEEK IMMEDIATE MEDICAL CARE IF:  °· Your  pain does not go away or gets worse. °· You have a fever. °· Your pain is felt only in portions of the abdomen. The right side could possibly be appendicitis. The left lower portion of the abdomen could be colitis or diverticulitis. °· You are passing blood in your stools (bright red or black tarry stools, with or without vomiting). °· You have blood in your urine. °· You develop chills, with or without a fever. °· You pass out. °MAKE SURE YOU:  °· Understand these instructions. °· Will watch your condition. °· Will get help right away if you are not doing well or get worse. °Document Released: 06/01/2007 Document Revised: 12/19/2013 Document Reviewed: 06/21/2009 °[employer]® Patient Information ©2015 [employer]. This information is not intended to replace advice given to you by your health care provider. Make sure you discuss any questions you have with your health care provider. ° °

## 2014-07-24 NOTE — ED Notes (Signed)
Pt states that about 1 pm she started having left flank pain, denies any n/v

## 2014-07-25 LAB — GC/CHLAMYDIA PROBE AMP
CT PROBE, AMP APTIMA: NEGATIVE
GC PROBE AMP APTIMA: NEGATIVE

## 2014-08-08 ENCOUNTER — Encounter: Payer: Self-pay | Admitting: Family Medicine

## 2014-08-08 ENCOUNTER — Ambulatory Visit (INDEPENDENT_AMBULATORY_CARE_PROVIDER_SITE_OTHER): Payer: 59 | Admitting: Family Medicine

## 2014-08-08 VITALS — BP 119/71 | HR 78 | Temp 98.6°F | Ht 66.0 in | Wt 191.0 lb

## 2014-08-08 DIAGNOSIS — G4700 Insomnia, unspecified: Secondary | ICD-10-CM

## 2014-08-08 DIAGNOSIS — A499 Bacterial infection, unspecified: Secondary | ICD-10-CM

## 2014-08-08 DIAGNOSIS — G44209 Tension-type headache, unspecified, not intractable: Secondary | ICD-10-CM

## 2014-08-08 DIAGNOSIS — B9689 Other specified bacterial agents as the cause of diseases classified elsewhere: Secondary | ICD-10-CM

## 2014-08-08 DIAGNOSIS — J329 Chronic sinusitis, unspecified: Secondary | ICD-10-CM

## 2014-08-08 DIAGNOSIS — I10 Essential (primary) hypertension: Secondary | ICD-10-CM

## 2014-08-08 MED ORDER — HYDROCHLOROTHIAZIDE 25 MG PO TABS: ORAL_TABLET | ORAL | Status: DC

## 2014-08-08 MED ORDER — LISINOPRIL-HYDROCHLOROTHIAZIDE 20-12.5 MG PO TABS: 1.0000 | ORAL_TABLET | Freq: Every day | ORAL | Status: DC

## 2014-08-08 MED ORDER — LEVOCETIRIZINE DIHYDROCHLORIDE 5 MG PO TABS: 5.0000 mg | ORAL_TABLET | Freq: Every evening | ORAL | Status: DC

## 2014-08-08 MED ORDER — TRAZODONE HCL 100 MG PO TABS: ORAL_TABLET | ORAL | Status: DC

## 2014-08-08 MED ORDER — DOXYCYCLINE HYCLATE 100 MG PO TABS: ORAL_TABLET | ORAL | Status: AC

## 2014-08-08 MED ORDER — BENZONATATE 200 MG PO CAPS: 200.0000 mg | ORAL_CAPSULE | Freq: Two times a day (BID) | ORAL | Status: DC | PRN

## 2014-08-08 MED ORDER — HYDROCODONE-ACETAMINOPHEN 5-325 MG PO TABS: 1.0000 | ORAL_TABLET | Freq: Every day | ORAL | Status: DC | PRN

## 2014-08-08 NOTE — Progress Notes (Signed)
CC: Kimberly Wade is a 43 y.o. female is here for Cough; nasal drainage; and Headache   Subjective: HPI:  Complains of facial pressure localized in the forehead and between the eyes that has been present for a little over 1 week now. Symptoms seem to be worsening on daily basis. Accompanied by nonproductive cough, nasal congestion and postnasal drip. She reports fatigue but denies fevers or if she has had occasional chills. She denies night sweats. She denies motor or sensory disturbances, shortness of breath, chest pain, nor wheezing. No benefit from over-the-counter cough and cold medications. Overall symptoms are moderate in severity  She's requesting refills on trazodone for which she uses for insomnia. She uses this most nights of the week due to an inability to fall asleep. Provided she takes this she denies any difficulty with falling or staying asleep.  Requesting refills on blood pressure medication. She is taking a total of 50 mg of hydrochlorothiazide and 20 mg of lisinopril. No outside blood pressures report.   Requesting refills on hydrocodone. She takes this sparingly for tension headaches since she has been advised to limit nonsteroidal anti-inflammatory use ever since her gastric bypass surgery. She does not believe that the severity character or frequency of her headaches have been changed since she was originally prescribed this in October.  Review Of Systems Outlined In HPI  Past Medical History  Diagnosis Date  . Anxiety   . GERD (gastroesophageal reflux disease)   . Insomnia   . Pneumonia   . Hypertension   . Hyperlipidemia   . Pneumococcal pneumonia 2000  . Diabetes     States after gastric Bypass Not a diabetic now    Past Surgical History  Procedure Laterality Date  . Cesarean section      x4  . Chest tube insertion    . Gastric bypass    . Dysplastic mole removed      right ankle  . Chest tubes  2000  . Recoonstruction of right lung    . Mini gastric  bypass  2011  . Video bronchoscopy Bilateral 04/20/2013    Procedure: VIDEO BRONCHOSCOPY WITH FLUORO;  Surgeon: Elsie Stain, MD;  Location: [facility];  Service: Cardiopulmonary;  Laterality: Bilateral;  . Iud removal N/A 07/11/2014    Procedure: INTRAUTERINE DEVICE (IUD) REMOVAL;  Surgeon: Emily Filbert, MD;  Location: [facility];  Service: Gynecology;  Laterality: N/A;  . Intrauterine device (iud) insertion N/A 07/11/2014    Procedure: INTRAUTERINE DEVICE (IUD) INSERTION;  Surgeon: Emily Filbert, MD;  Location: [facility];  Service: Gynecology;  Laterality: N/A;   Family History  Problem Relation Age of Onset  . Colon cancer      grandmother  . Heart attack      grandfather  . Diabetes Father     father  . Hypertension Father   . Hyperlipidemia    . Hypertension Mother   . Asthma Daughter     History   Social History  . Marital Status: Legally Separated    Spouse Name: N/A    Number of Children: N/A  . Years of Education: N/A   Occupational History  . Advertising Accountant    Social History Main Topics  . Smoking status: Never Smoker   . Smokeless tobacco: Never Used  . Alcohol Use: Yes     Comment: 1 glass per month  . Drug Use: No  . Sexual Activity:    Partners: Male   Other Topics Concern  .  Not on file   Social History Narrative     Objective: BP 119/71 mmHg  Pulse 78  Temp(Src) 98.6 F (37 C)  Ht 5\' 6"  (1.676 m)  Wt 191 lb (86.637 kg)  BMI 30.84 kg/m2  SpO2 100%  General: Alert and Oriented, No Acute Distress HEENT: Pupils equal, round, reactive to light. Conjunctivae clear.  External ears unremarkable, canals clear with intact TMs with appropriate landmarks.  Middle ear appears open without effusion. Pink inferior turbinates.  Moist mucous membranes, pharynx with moderate cobblestoning and postnasal drip.  Neck supple without palpable lymphadenopathy nor abnormal masses. Lungs: Clear to auscultation bilaterally, no wheezing/ronchi/rales.   Comfortable  work of breathing. Good air movement. Cardiac: Regular rate and rhythm. Normal S1/S2.  No murmurs, rubs, nor gallops.   Extremities: No peripheral edema.  Strong peripheral pulses.  Mental Status: No depression, anxiety, nor agitation. Skin: Warm and dry.  Assessment & Plan: Tiffancy was seen today for cough, nasal drainage and headache.  Diagnoses and associated orders for this visit:  Bacterial sinusitis - benzonatate (TESSALON) 200 MG capsule; Take 1 capsule (200 mg total) by mouth 2 (two) times daily as needed for cough. - doxycycline (VIBRA-TABS) 100 MG tablet; One by mouth twice a day for ten days.  Essential hypertension - hydrochlorothiazide (HYDRODIURIL) 25 MG tablet; One tablet by mouth every morning for blood pressure control.  Tension headache  Insomnia  Other Orders - lisinopril-hydrochlorothiazide (ZESTORETIC) 20-12.5 MG per tablet; Take 1 tablet by mouth daily. - levocetirizine (XYZAL) 5 MG tablet; Take 1 tablet (5 mg total) by mouth every evening. - traZODone (DESYREL) 100 MG tablet; TAKE 1-2 TABS AT BEDTIME ONLY AS NEEDED. - HYDROcodone-acetaminophen (NORCO/VICODIN) 5-325 MG per tablet; Take 1 tablet by mouth daily as needed for moderate pain or severe pain.    Bacterial sinusitis: Start doxycycline, treating cough with Tessalon Perles which she has responded nicely to in the past. Consider nasal saline washes Essential hypertension: Controlled continue lisinopril-hydrochlorothiazide and additional hydrochlorothiazide Tension headache: Controlled with as needed hydrocodone Insomnia: Controlled with as needed trazodone use  Return if symptoms worsen or fail to improve.

## 2014-08-24 ENCOUNTER — Ambulatory Visit (INDEPENDENT_AMBULATORY_CARE_PROVIDER_SITE_OTHER): Payer: 59 | Admitting: Sports Medicine

## 2014-08-24 ENCOUNTER — Encounter: Payer: Self-pay | Admitting: Sports Medicine

## 2014-08-24 DIAGNOSIS — M79641 Pain in right hand: Secondary | ICD-10-CM

## 2014-08-24 MED ORDER — HYDROCODONE-ACETAMINOPHEN 5-325 MG PO TABS: 1.0000 | ORAL_TABLET | Freq: Every day | ORAL | Status: DC | PRN

## 2014-08-24 NOTE — Assessment & Plan Note (Signed)
Six-month response to the last injection into the right third metacarpal phalangeal joint. Pain has recurred but no where near as severe as before. Next line repeat injection into the right third metacarpal phalangeal joint. This likely represents osteoarthritis. She does have a family history of rheumatoid arthritis so we will do an arthritis workup.

## 2014-08-24 NOTE — Progress Notes (Signed)
  Subjective:    CC: Follow-up  HPI: This is a pleasant 44 year old female, she had a right third metacarpal phalangeal synovitis 6 months ago, we injected, she had a six-month response and returns today with a recurrence of pain, or near as bad as it was before. Pain is moderate, persistent, localized at the joint, no mechanical symptoms, good strength. She does have significant morning stiffness.  Past medical history, Surgical history, Family history not pertinant except as noted below, Social history, Allergies, and medications have been entered into the medical record, reviewed, and no changes needed.   Review of Systems: No fevers, chills, night sweats, weight loss, chest pain, or shortness of breath.   Objective:    General: Well Developed, well nourished, and in no acute distress.  Neuro: Alert and oriented x3, extra-ocular muscles intact, sensation grossly intact.  HEENT: Normocephalic, atraumatic, pupils equal round reactive to light, neck supple, no masses, no lymphadenopathy, thyroid nonpalpable.  Skin: Warm and dry, no rashes. Cardiac: Regular rate and rhythm, no murmurs rubs or gallops, no lower extremity edema.  Respiratory: Clear to auscultation bilaterally. Not using accessory muscles, speaking in full sentences. Right hand: Palpable and swollen synovitis at the metacarpal phalangeal joint, third. There is mild limitation in range of motion.  Procedure: Real-time Ultrasound Guided Injection of right third MCP Device: GE Logiq E  Verbal informed consent obtained.  Time-out conducted.  Noted no overlying erythema, induration, or other signs of local infection.  Skin prepped in a sterile fashion.  Local anesthesia: Topical Ethyl chloride.  With sterile technique and under real time ultrasound guidance:  0.5 mL kenalog 40, 0.5 mL lidocaine injected through a 30-gauge needle. Completed without difficulty  Pain immediately resolved suggesting accurate placement of the  medication.  Advised to call if fevers/chills, erythema, induration, drainage, or persistent bleeding.  Images permanently stored and available for review in the ultrasound unit.  Impression: Technically successful ultrasound guided injection.  Impression and Recommendations:

## 2014-08-25 LAB — RHEUMATOID FACTOR: Rheumatoid fact SerPl-aCnc: <10 [IU]/mL (ref <=14)

## 2014-08-25 LAB — SEDIMENTATION RATE: Sed Rate: 12 mm/hr (ref 0–22)

## 2014-08-25 LAB — URIC ACID: Uric Acid, Serum: 3.2 mg/dL (ref 2.4–7.0)

## 2014-08-25 LAB — ANA: Anti Nuclear Antibody(ANA): NEGATIVE

## 2014-08-28 LAB — CYCLIC CITRUL PEPTIDE ANTIBODY, IGG: Cyclic Citrullin Peptide Ab: <2 U/mL (ref 0.0–5.0)

## 2014-09-07 ENCOUNTER — Emergency Department (INDEPENDENT_AMBULATORY_CARE_PROVIDER_SITE_OTHER)
Admission: EM | Admit: 2014-09-07 | Discharge: 2014-09-07 | Disposition: A | Discharge status: Home / Self Care | Payer: 59 | Source: Home / Self Care | Attending: Family Medicine | Admitting: Family Medicine

## 2014-09-07 DIAGNOSIS — N3000 Acute cystitis without hematuria: Secondary | ICD-10-CM

## 2014-09-07 LAB — POCT URINALYSIS DIP (MANUAL ENTRY)
Glucose, UA: NEGATIVE
Ketones, POC UA: NEGATIVE
Nitrite, UA: NEGATIVE
Urobilinogen, UA: 0.2 (ref 0–1)
pH, UA: 6 (ref 5–8)

## 2014-09-07 MED ORDER — CEPHALEXIN 500 MG PO CAPS: 500.0000 mg | ORAL_CAPSULE | Freq: Two times a day (BID) | ORAL | Status: DC

## 2014-09-07 NOTE — ED Provider Notes (Signed)
Kimberly Wade is a 44 y.o. female who presents to Urgent Care today for repair frequency urgency and dysuria starting today. Symptoms are similar to previous UTI. No fevers or chills vomiting or diarrhea. Patient is allergic to penicillin but can tolerate Keflex.  Past Medical History  Diagnosis Date  . Anxiety   . GERD (gastroesophageal reflux disease)   . Insomnia   . Pneumonia   . Hypertension   . Hyperlipidemia   . Pneumococcal pneumonia 2000  . Diabetes     States after gastric Bypass Not a diabetic now   Past Surgical History  Procedure Laterality Date  . Cesarean section      x4  . Chest tube insertion    . Gastric bypass    . Dysplastic mole removed      right ankle  . Chest tubes  2000  . Recoonstruction of right lung    . Mini gastric bypass  2011  . Video bronchoscopy Bilateral 04/20/2013    Procedure: VIDEO BRONCHOSCOPY WITH FLUORO;  Surgeon: Elsie Stain, MD;  Location: [facility];  Service: Cardiopulmonary;  Laterality: Bilateral;  . Iud removal N/A 07/11/2014    Procedure: INTRAUTERINE DEVICE (IUD) REMOVAL;  Surgeon: Emily Filbert, MD;  Location: [facility];  Service: Gynecology;  Laterality: N/A;  . Intrauterine device (iud) insertion N/A 07/11/2014    Procedure: INTRAUTERINE DEVICE (IUD) INSERTION;  Surgeon: Emily Filbert, MD;  Location: [facility];  Service: Gynecology;  Laterality: N/A;   History  Substance Use Topics  . Smoking status: Never Smoker   . Smokeless tobacco: Never Used  . Alcohol Use: Yes     Comment: 1 glass per month   ROS as above Medications: No current facility-administered medications for this encounter.   Current Outpatient Prescriptions  Medication Sig Dispense Refill  . ALPRAZolam (XANAX) 1 MG tablet Take 1 tablet (1 mg total) by mouth 3 (three) times daily as needed for sleep. 90 tablet 0  . benzonatate (TESSALON) 200 MG capsule Take 1 capsule (200 mg total) by mouth 2 (two) times daily as needed for cough. 20 capsule 0  . Biotin 5000  MCG TABS Take 1 tablet by mouth 2 (two) times daily.    . Calcium Citrate-Vitamin D (CALCIUM CITRATE + PO) Take 1 tablet by mouth daily.     . cephALEXin (KEFLEX) 500 MG capsule Take 1 capsule (500 mg total) by mouth 2 (two) times daily. 14 capsule 0  . ferrous sulfate 325 (65 FE) MG tablet Take 1 tablet (325 mg total) by mouth 2 (two) times daily with a meal.    . hydrochlorothiazide (HYDRODIURIL) 25 MG tablet One tablet by mouth every morning for blood pressure control. 30 tablet 2  . HYDROcodone-acetaminophen (NORCO/VICODIN) 5-325 MG per tablet Take 1 tablet by mouth daily as needed for moderate pain or severe pain. 30 tablet 0  . levocetirizine (XYZAL) 5 MG tablet Take 1 tablet (5 mg total) by mouth every evening. 30 tablet 2  . levonorgestrel (MIRENA) 20 MCG/24HR IUD 1 each by Intrauterine route once.    [name] lisinopril-hydrochlorothiazide (ZESTORETIC) 20-12.5 MG per tablet Take 1 tablet by mouth daily. 90 tablet 3  . Multiple Vitamin (MULTIVITAMIN) tablet Take 1 tablet by mouth 3 (three) times daily.    [name] omeprazole (PRILOSEC) 40 MG capsule Take 40 mg by mouth daily as needed (heartburn).    . traZODone (DESYREL) 100 MG tablet TAKE 1-2 TABS AT BEDTIME ONLY AS NEEDED. 90 tablet 3  . Vitamin  D, Ergocalciferol, (DRISDOL) 50000 UNITS CAPS capsule Take 1 capsule (50,000 Units total) by mouth every 7 (seven) days. 4 capsule 11   Allergies  Allergen Reactions  . Levaquin [Levofloxacin In D5w] Hives    Has tolerated avelox  . Penicillins Hives     Exam:  BP 121/77 mmHg  Pulse 71  Temp(Src) 98.3 F (36.8 C) (Oral)  Ht 5\' 3"  (1.6 m)  Wt 196 lb (88.905 kg)  BMI 34.73 kg/m2  SpO2 99% Gen: Well NAD HEENT: EOMI,  MMM Lungs: Normal work of breathing. CTABL Heart: RRR no MRG Abd: NABS, Soft. Nondistended, Nontender no CVA tenderness to percussion Exts: Brisk capillary refill, warm and well perfused.   Results for orders placed or performed during the hospital encounter of 09/07/14 (from the  past 24 hour(s))  POCT urinalysis dipstick (new)     Status: None   Collection Time: 09/07/14  5:58 PM  Result Value Ref Range   Color, UA yellow    Clarity, UA hazy    Glucose, UA neg    Bilirubin, UA small    Bilirubin, UA negative    Spec Grav, UA >=1.030 1.005 - 1.03   Blood, UA large    pH, UA 6.0 5 - 8   Protein Ur, POC =100    Urobilinogen, UA 0.2 0 - 1   Nitrite, UA Negative    Leukocytes, UA small (1+)    No results found.  Assessment and Plan: 44 y.o. female with UTI. Treat with Keflex.   Discussed warning signs or symptoms. Please see discharge instructions. Patient expresses understanding.     Gregor Hams, MD 09/07/14 1800

## 2014-09-07 NOTE — Discharge Instructions (Signed)
Thank you for coming in today. If your belly pain worsens, or you have high fever, bad vomiting, blood in your stool or black tarry stool go to the Emergency Room.   Urinary Tract Infection Urinary tract infections (UTIs) can develop anywhere along your urinary tract. Your urinary tract is your body's drainage system for removing wastes and extra water. Your urinary tract includes two kidneys, two ureters, a bladder, and a urethra. Your kidneys are a pair of bean-shaped organs. Each kidney is about the size of your fist. They are located below your ribs, one on each side of your spine. CAUSES Infections are caused by microbes, which are microscopic organisms, including fungi, viruses, and bacteria. These organisms are so small that they can only be seen through a microscope. Bacteria are the microbes that most commonly cause UTIs. SYMPTOMS  Symptoms of UTIs may vary by age and gender of the patient and by the location of the infection. Symptoms in young women typically include a frequent and intense urge to urinate and a painful, burning feeling in the bladder or urethra during urination. Older women and men are more likely to be tired, shaky, and weak and have muscle aches and abdominal pain. A fever may mean the infection is in your kidneys. Other symptoms of a kidney infection include pain in your back or sides below the ribs, nausea, and vomiting. DIAGNOSIS To diagnose a UTI, your caregiver will ask you about your symptoms. Your caregiver also will ask to provide a urine sample. The urine sample will be tested for bacteria and white blood cells. White blood cells are made by your body to help fight infection. TREATMENT  Typically, UTIs can be treated with medication. Because most UTIs are caused by a bacterial infection, they usually can be treated with the use of antibiotics. The choice of antibiotic and length of treatment depend on your symptoms and the type of bacteria causing your  infection. HOME CARE INSTRUCTIONS  If you were prescribed antibiotics, take them exactly as your caregiver instructs you. Finish the medication even if you feel better after you have only taken some of the medication.  Drink enough water and fluids to keep your urine clear or pale yellow.  Avoid caffeine, tea, and carbonated beverages. They tend to irritate your bladder.  Empty your bladder often. Avoid holding urine for long periods of time.  Empty your bladder before and after sexual intercourse.  After a bowel movement, women should cleanse from front to back. Use each tissue only once. SEEK MEDICAL CARE IF:   You have back pain.  You develop a fever.  Your symptoms do not begin to resolve within 3 days. SEEK IMMEDIATE MEDICAL CARE IF:   You have severe back pain or lower abdominal pain.  You develop chills.  You have nausea or vomiting.  You have continued burning or discomfort with urination. MAKE SURE YOU:   Understand these instructions.  Will watch your condition.  Will get help right away if you are not doing well or get worse. Document Released: 05/14/2005 Document Revised: 02/03/2012 Document Reviewed: 09/12/2011 Exit[employer] Patient Information 2015 [employer]. This information is not intended to replace advice given to you by your health care provider. Make sure you discuss any questions you have with your health care provider.  

## 2014-09-21 ENCOUNTER — Ambulatory Visit: Payer: 59 | Admitting: Sports Medicine

## 2014-09-21 DIAGNOSIS — Z0289 Encounter for other administrative examinations: Secondary | ICD-10-CM

## 2014-09-27 ENCOUNTER — Encounter: Payer: Self-pay | Admitting: Family Medicine

## 2014-09-27 ENCOUNTER — Ambulatory Visit (INDEPENDENT_AMBULATORY_CARE_PROVIDER_SITE_OTHER): Payer: 59 | Admitting: Family Medicine

## 2014-09-27 VITALS — BP 126/79 | HR 71 | Wt 195.0 lb

## 2014-09-27 DIAGNOSIS — I10 Essential (primary) hypertension: Secondary | ICD-10-CM

## 2014-09-27 DIAGNOSIS — F411 Generalized anxiety disorder: Secondary | ICD-10-CM

## 2014-09-27 DIAGNOSIS — F419 Anxiety disorder, unspecified: Secondary | ICD-10-CM

## 2014-09-27 DIAGNOSIS — E559 Vitamin D deficiency, unspecified: Secondary | ICD-10-CM

## 2014-09-27 DIAGNOSIS — G44209 Tension-type headache, unspecified, not intractable: Secondary | ICD-10-CM

## 2014-09-27 DIAGNOSIS — G4700 Insomnia, unspecified: Secondary | ICD-10-CM

## 2014-09-27 MED ORDER — VITAMIN D (ERGOCALCIFEROL) 1.25 MG (50000 UNIT) PO CAPS: 50000.0000 [IU] | ORAL_CAPSULE | ORAL | Status: DC

## 2014-09-27 MED ORDER — ALPRAZOLAM 1 MG PO TABS: 1.0000 mg | ORAL_TABLET | Freq: Two times a day (BID) | ORAL | Status: DC | PRN

## 2014-09-27 MED ORDER — LEVOCETIRIZINE DIHYDROCHLORIDE 5 MG PO TABS: 5.0000 mg | ORAL_TABLET | Freq: Every evening | ORAL | Status: DC

## 2014-09-27 MED ORDER — HYDROCHLOROTHIAZIDE 25 MG PO TABS: ORAL_TABLET | ORAL | Status: DC

## 2014-09-27 MED ORDER — HYDROCODONE-ACETAMINOPHEN 5-325 MG PO TABS: 1.0000 | ORAL_TABLET | Freq: Every day | ORAL | Status: DC | PRN

## 2014-09-27 MED ORDER — TRAZODONE HCL 100 MG PO TABS: ORAL_TABLET | ORAL | Status: DC

## 2014-09-27 MED ORDER — LISINOPRIL-HYDROCHLOROTHIAZIDE 20-12.5 MG PO TABS: 1.0000 | ORAL_TABLET | Freq: Every day | ORAL | Status: DC

## 2014-09-27 MED ORDER — FERROUS SULFATE 325 (65 FE) MG PO TABS: 325.0000 mg | ORAL_TABLET | Freq: Two times a day (BID) | ORAL | Status: DC

## 2014-09-27 NOTE — Progress Notes (Signed)
CC: Kimberly Wade is a 44 y.o. female is here for f/u all meds   Subjective: HPI:   she shares the sad news that she's been laid off recently and will lose insurance coverage in the next couple of days. She is wondering if she can get refills on medications prior to her running out of insurance   Follow-up generalized anxiety disorder: She is been taking a single dose of alprazolam on a almost daily basis over the past few months. She's felt more anxious ever since getting the news that she was laid off but still only taking 1 a day. This seems to help called her nerves for the rest of the day. Nothing else seems to make symptoms better or worse   Follow-up of central hypertension: No outside blood pressures to report. No chest pain shortness of breath orthopnea nor peripheral edema   Follow-up insomnia: she tried cutting back on trazodone to only 2 100 mg doses a night however would wake up in the middle of the night needing to take a third dose. Provided she takes 3 doses right at the time of getting into bed she has no difficulty getting asleep or staying asleep she denies any known side effects   Follow-up vitamin D deficiency:  She continues to take her weekly vitamin D supplement. She would like to know if she can keep doing this indefinitely  Since she was found have a vitamin D deficiency after her bariatric surgery.   follow-up tension headache:  1-2 times a week  Has been requiring hydrocodone.  To get rid of a tension type headache  Which is localized from the back of the neck up into rapid around the crown of the skull. Character and severity are unchanged over the past year. Nothing particularly provokes these headaches that she knows of they are not responsive to over-the-counter anti-inflammatories   Review Of Systems Outlined In HPI  Past Medical History  Diagnosis Date  . Anxiety   . GERD (gastroesophageal reflux disease)   . Insomnia   . Pneumonia   . Hypertension   .  Hyperlipidemia   . Pneumococcal pneumonia 2000  . Diabetes     States after gastric Bypass Not a diabetic now    Past Surgical History  Procedure Laterality Date  . Cesarean section      x4  . Chest tube insertion    . Gastric bypass    . Dysplastic mole removed      right ankle  . Chest tubes  2000  . Recoonstruction of right lung    . Mini gastric bypass  2011  . Video bronchoscopy Bilateral 04/20/2013    Procedure: VIDEO BRONCHOSCOPY WITH FLUORO;  Surgeon: Elsie Stain, MD;  Location: [facility];  Service: Cardiopulmonary;  Laterality: Bilateral;  . Iud removal N/A 07/11/2014    Procedure: INTRAUTERINE DEVICE (IUD) REMOVAL;  Surgeon: Emily Filbert, MD;  Location: [facility];  Service: Gynecology;  Laterality: N/A;  . Intrauterine device (iud) insertion N/A 07/11/2014    Procedure: INTRAUTERINE DEVICE (IUD) INSERTION;  Surgeon: Emily Filbert, MD;  Location: [facility];  Service: Gynecology;  Laterality: N/A;   Family History  Problem Relation Age of Onset  . Colon cancer      grandmother  . Heart attack      grandfather  . Diabetes Father     father  . Hypertension Father   . Hyperlipidemia    . Hypertension Mother   . Asthma Daughter  History   Social History  . Marital Status: Legally Separated    Spouse Name: N/A  . Number of Children: N/A  . Years of Education: N/A   Occupational History  . Advertising Accountant    Social History Main Topics  . Smoking status: Never Smoker   . Smokeless tobacco: Never Used  . Alcohol Use: Yes     Comment: 1 glass per month  . Drug Use: No  . Sexual Activity:    Partners: Male   Other Topics Concern  . Not on file   Social History Narrative     Objective: BP 126/79 mmHg  Pulse 71  Wt 195 lb (88.451 kg)  Vital signs reviewed. General: Alert and Oriented, No Acute Distress HEENT: Pupils equal, round, reactive to light. Conjunctivae clear.  External ears unremarkable.  Moist mucous membranes. Lungs: Clear and  comfortable work of breathing, speaking in full sentences without accessory muscle use. Cardiac: Regular rate and rhythm.  Neuro: CN II-XII grossly intact, gait normal. Extremities: No peripheral edema.  Strong peripheral pulses.  Mental Status: No depression, anxiety, nor agitation. Logical though process. Skin: Warm and dry.  Assessment & Plan: Shakela was seen today for f/u all meds.  Diagnoses and all orders for this visit:  Generalized anxiety disorder Orders: -     ALPRAZolam (XANAX) 1 MG tablet; Take 1 tablet (1 mg total) by mouth 2 (two) times daily as needed for anxiety or sleep.  Essential hypertension Orders: -     hydrochlorothiazide (HYDRODIURIL) 25 MG tablet; One tablet by mouth every morning for blood pressure control.  Insomnia  Vitamin D deficiency  Anxiety  Tension headache  Other orders -     traZODone (DESYREL) 100 MG tablet; TAKE 1-3 TABS AT BEDTIME ONLY AS NEEDED. -     HYDROcodone-acetaminophen (NORCO/VICODIN) 5-325 MG per tablet; Take 1 tablet by mouth daily as needed for moderate pain or severe pain. -     lisinopril-hydrochlorothiazide (ZESTORETIC) 20-12.5 MG per tablet; Take 1 tablet by mouth daily. -     levocetirizine (XYZAL) 5 MG tablet; Take 1 tablet (5 mg total) by mouth every evening. -     Vitamin D, Ergocalciferol, (DRISDOL) 50000 UNITS CAPS capsule; Take 1 capsule (50,000 Units total) by mouth every 7 (seven) days. -     ferrous sulfate 325 (65 FE) MG tablet; Take 1 tablet (325 mg total) by mouth 2 (two) times daily with a meal.   Generalized anxiety disorder: Controlled with as needed use of alprazolam with both anticipate that she is going to be under more stress during this period of unemployment side given her a little extra to take if needed but stressed not to take more than 2 doses a day Essential hypertension: Controlled continue hydrochlorothiazide and lisinopril-hydrochlorothiazide Insomnia: Controlled on trazodone 300 mg  nightly Tension headache: Controlled on as neededsparing use of hydrocodone Vitamin D deficiency: control continue weekly supplementation  Return if symptoms worsen or fail to improve.

## 2014-10-09 ENCOUNTER — Encounter: Payer: Self-pay | Admitting: Obstetrics & Gynecology

## 2014-10-09 ENCOUNTER — Ambulatory Visit (INDEPENDENT_AMBULATORY_CARE_PROVIDER_SITE_OTHER): Payer: 59 | Admitting: Obstetrics & Gynecology

## 2014-10-09 VITALS — BP 109/65 | HR 75 | Resp 16 | Ht 66.0 in | Wt 195.0 lb

## 2014-10-09 DIAGNOSIS — Z01419 Encounter for gynecological examination (general) (routine) without abnormal findings: Secondary | ICD-10-CM

## 2014-10-09 DIAGNOSIS — Z1151 Encounter for screening for human papillomavirus (HPV): Secondary | ICD-10-CM

## 2014-10-09 DIAGNOSIS — Z0000 Encounter for general adult medical examination without abnormal findings: Secondary | ICD-10-CM

## 2014-10-09 DIAGNOSIS — Z124 Encounter for screening for malignant neoplasm of cervix: Secondary | ICD-10-CM

## 2014-10-09 NOTE — Progress Notes (Signed)
Subjective:    Kimberly Wade is a 44 y.o. separated female P4 (24,deceased son who lived for 14 hours, 37, 17) who presents for an annual exam. The patient has no complaints today. The patient is sexually active. GYN screening history: last pap: was normal. The patient wears seatbelts: yes. The patient participates in regular exercise: yes. Has the patient ever been transfused or tattooed?: yes. The patient reports that there is not domestic violence in her life.   Menstrual History: OB History    Gravida Para Term Preterm AB TAB SAB Ectopic Multiple Living   5 4  4      3       Menarche age: 59  Patient's last menstrual period was 09/18/2014.    The following portions of the patient's history were reviewed and updated as appropriate: allergies, current medications, past family history, past medical history, past social history, past surgical history and problem list.  Review of Systems Pertinent items are noted in HPI. She has had a flu and pneumonia vaccines. Living with her fiance for 3 1/2 years. No breast/colon in first degree relatives. Periods are monthly, even with Nexplanon.   Objective:    BP 109/65 mmHg  Pulse 75  Resp 16  Ht 5\' 6"  (1.676 m)  Wt 195 lb (88.451 kg)  BMI 31.49 kg/m2  LMP 09/18/2014  General Appearance:    Alert, cooperative, no distress, appears stated age  Head:    Normocephalic, without obvious abnormality, atraumatic  Eyes:    PERRL, conjunctiva/corneas clear, EOM's intact, fundi    benign, both eyes  Ears:    Normal TM's and external ear canals, both ears  Nose:   Nares normal, septum midline, mucosa normal, no drainage    or sinus tenderness  Throat:   Lips, mucosa, and tongue normal; teeth and gums normal  Neck:   Supple, symmetrical, trachea midline, no adenopathy;    thyroid:  no enlargement/tenderness/nodules; no carotid   bruit or JVD  Back:     Symmetric, no curvature, ROM normal, no CVA tenderness  Lungs:     Clear to auscultation  bilaterally, respirations unlabored  Chest Wall:    No tenderness or deformity   Heart:    Regular rate and rhythm, S1 and S2 normal, no murmur, rub   or gallop  Breast Exam:    No tenderness, masses, or nipple abnormality  Abdomen:     Soft, non-tender, bowel sounds active all four quadrants,    no masses, no organomegaly  Genitalia:    Normal female without lesion, discharge or tenderness, ULN size, NT, mobile, adnexa not palpable     Extremities:   Extremities normal, atraumatic, no cyanosis or edema  Pulses:   2+ and symmetric all extremities  Skin:   Skin color, texture, turgor normal, no rashes or lesions  Lymph nodes:   Cervical, supraclavicular, and axillary nodes normal  Neurologic:   CNII-XII intact, normal strength, sensation and reflexes    throughout  .    Assessment:    Healthy female exam.    Plan:     Breast self exam technique reviewed and patient encouraged to perform self-exam monthly. Mammogram. Thin prep Pap smear. with cotesting

## 2014-10-11 ENCOUNTER — Ambulatory Visit (INDEPENDENT_AMBULATORY_CARE_PROVIDER_SITE_OTHER): Payer: 59

## 2014-10-11 DIAGNOSIS — Z1231 Encounter for screening mammogram for malignant neoplasm of breast: Secondary | ICD-10-CM

## 2014-10-11 DIAGNOSIS — Z0000 Encounter for general adult medical examination without abnormal findings: Secondary | ICD-10-CM

## 2014-10-11 LAB — CYTOLOGY - PAP

## 2015-01-03 ENCOUNTER — Other Ambulatory Visit: Payer: Self-pay | Admitting: Family Medicine

## 2015-02-02 ENCOUNTER — Ambulatory Visit (INDEPENDENT_AMBULATORY_CARE_PROVIDER_SITE_OTHER): Payer: 59 | Admitting: Family Medicine

## 2015-02-02 ENCOUNTER — Encounter: Payer: Self-pay | Admitting: Family Medicine

## 2015-02-02 VITALS — BP 134/71 | HR 83 | Ht 66.0 in | Wt 195.0 lb

## 2015-02-02 DIAGNOSIS — F411 Generalized anxiety disorder: Secondary | ICD-10-CM | POA: Diagnosis not present

## 2015-02-02 DIAGNOSIS — M79641 Pain in right hand: Secondary | ICD-10-CM | POA: Diagnosis not present

## 2015-02-02 DIAGNOSIS — G4700 Insomnia, unspecified: Secondary | ICD-10-CM

## 2015-02-02 DIAGNOSIS — G43809 Other migraine, not intractable, without status migrainosus: Secondary | ICD-10-CM

## 2015-02-02 DIAGNOSIS — I10 Essential (primary) hypertension: Secondary | ICD-10-CM | POA: Diagnosis not present

## 2015-02-02 DIAGNOSIS — E559 Vitamin D deficiency, unspecified: Secondary | ICD-10-CM

## 2015-02-02 MED ORDER — TRAZODONE HCL 100 MG PO TABS: ORAL_TABLET | ORAL | Status: DC

## 2015-02-02 MED ORDER — LEVOCETIRIZINE DIHYDROCHLORIDE 5 MG PO TABS: ORAL_TABLET | ORAL | Status: DC

## 2015-02-02 MED ORDER — SUMATRIPTAN SUCCINATE 6 MG/0.5ML ~~LOC~~ SOAJ: SUBCUTANEOUS | Status: DC

## 2015-02-02 MED ORDER — HYDROCHLOROTHIAZIDE 25 MG PO TABS: ORAL_TABLET | ORAL | Status: DC

## 2015-02-02 MED ORDER — ALPRAZOLAM 1 MG PO TABS: 1.0000 mg | ORAL_TABLET | Freq: Two times a day (BID) | ORAL | Status: DC | PRN

## 2015-02-02 MED ORDER — VITAMIN D (ERGOCALCIFEROL) 1.25 MG (50000 UNIT) PO CAPS: 50000.0000 [IU] | ORAL_CAPSULE | ORAL | Status: DC

## 2015-02-02 MED ORDER — LISINOPRIL-HYDROCHLOROTHIAZIDE 20-12.5 MG PO TABS: 1.0000 | ORAL_TABLET | Freq: Every day | ORAL | Status: DC

## 2015-02-02 MED ORDER — HYDROCODONE-ACETAMINOPHEN 5-325 MG PO TABS: 1.0000 | ORAL_TABLET | Freq: Every day | ORAL | Status: DC | PRN

## 2015-02-02 MED ORDER — FERROUS SULFATE 325 (65 FE) MG PO TABS: 325.0000 mg | ORAL_TABLET | Freq: Two times a day (BID) | ORAL | Status: DC

## 2015-02-02 NOTE — Progress Notes (Signed)
CC: Kimberly Wade is a 44 y.o. female is here for Follow-up   Subjective: HPI:   Follow-up follow-up essential hypertension: Continues to take lisinopril-hydrochlorothiazide and additional hydrochlorothiazide daily. No outside blood pressures report. Nice chest pain shortness of breath orthopnea nor muscle cramps.  Follow-up migraine: She feels like she is getting migraines more frequently now. There's been no change in the character or severity of the migraines. She was once prescribed injectable Imitrex however was unable to afford it. She would like to explore whether or not she can afford it with her current Medicaid coverage.  Follow-up generalized anxiety: She was able to space out 90 Xanax tablets over the course of the last 4 months. She takes only when anxiety starts to give her physical symptoms such as panic attacks. States that symptoms satisfactorily resolved minutes after taking Xanax.  Complains of right hand pain localized in the knuckles which has been present for matter of years. It was once improved with sterile injections however slowly has returned. She is also noticing swelling and a knot in the palm of her hand at the base of the middle finger. She's been using hydrocodone to treat this, 120 tablets last her 4 months  Follow vitamin D deficiency: She is requesting refills on vitamin D, the weekly formulation  Insomnia: Requesting refills on trazodone. Provided she takes this on a nightly basis she denies any difficulty falling or staying asleep.   Review Of Systems Outlined In HPI  Past Medical History  Diagnosis Date  . Anxiety   . GERD (gastroesophageal reflux disease)   . Insomnia   . Pneumonia   . Hypertension   . Hyperlipidemia   . Pneumococcal pneumonia 2000  . Diabetes     States after gastric Bypass Not a diabetic now    Past Surgical History  Procedure Laterality Date  . Cesarean section      x4  . Chest tube insertion    . Gastric bypass    .  Dysplastic mole removed      right ankle  . Chest tubes  2000  . Recoonstruction of right lung    . Mini gastric bypass  2011  . Video bronchoscopy Bilateral 04/20/2013    Procedure: VIDEO BRONCHOSCOPY WITH FLUORO;  Surgeon: Kimberly Stain, MD;  Location: [facility];  Service: Cardiopulmonary;  Laterality: Bilateral;  . Iud removal N/A 07/11/2014    Procedure: INTRAUTERINE DEVICE (IUD) REMOVAL;  Surgeon: Kimberly Filbert, MD;  Location: [facility];  Service: Gynecology;  Laterality: N/A;  . Intrauterine device (iud) insertion N/A 07/11/2014    Procedure: INTRAUTERINE DEVICE (IUD) INSERTION;  Surgeon: Kimberly Filbert, MD;  Location: [facility];  Service: Gynecology;  Laterality: N/A;   Family History  Problem Relation Age of Onset  . Colon cancer      grandmother  . Heart attack      grandfather  . Diabetes Father     father  . Hypertension Father   . Hyperlipidemia    . Hypertension Mother   . Asthma Daughter     History   Social History  . Marital Status: Legally Separated    Spouse Name: N/A  . Number of Children: N/A  . Years of Education: N/A   Occupational History  . Advertising Accountant    Social History Main Topics  . Smoking status: Never Smoker   . Smokeless tobacco: Never Used  . Alcohol Use: Yes     Comment: 1 glass per month  .  Drug Use: No  . Sexual Activity:    Partners: Male   Other Topics Concern  . Not on file   Social History Narrative     Objective: BP 134/71 mmHg  Pulse 83  Ht 5\' 6"  (1.676 m)  Wt 195 lb (88.451 kg)  BMI 31.49 kg/m2  General: Alert and Oriented, No Acute Distress HEENT: Pupils equal, round, reactive to light. Conjunctivae clear.  Moist mucous membranes Lungs: Clear comfortable work of breathing Cardiac: Regular rate and rhythm. Extremities: No peripheral edema.  Strong peripheral pulses. Full range of motion throughout the entire right hand, just proximal to the base of the palmar aspect of the middle finger there is a small nodule  that appears to be attached to one of her flexor tendons.  Mental Status: No depression, anxiety, nor agitation. Skin: Warm and dry.  Assessment & Plan: Kimberly Wade was seen today for follow-up.  Diagnoses and all orders for this visit:  Essential hypertension, benign Orders: -     lisinopril-hydrochlorothiazide (ZESTORETIC) 20-12.5 MG per tablet; Take 1 tablet by mouth daily. -     hydrochlorothiazide (HYDRODIURIL) 25 MG tablet; TAKE ONE TABLET BY MOUTH EVERY MORNING FOR BLOOD PRESSURE CONTROL.  Other migraine without status migrainosus, not intractable Orders: -     SUMAtriptan 6 MG/0.5ML SOAJ; One injection as needed for migraine, may repeat once in two hours if not effective.  Generalized anxiety disorder Orders: -     ALPRAZolam (XANAX) 1 MG tablet; Take 1 tablet (1 mg total) by mouth 2 (two) times daily as needed for anxiety or sleep.  Right hand pain Orders: -     HYDROcodone-acetaminophen (NORCO/VICODIN) 5-325 MG per tablet; Take 1 tablet by mouth daily as needed for moderate pain or severe pain.  Vitamin D deficiency  Insomnia  Other orders -     levocetirizine (XYZAL) 5 MG tablet; TAKE 1 TABLET (5 MG TOTAL) BY MOUTH EVERY EVENING. -     Vitamin D, Ergocalciferol, (DRISDOL) 50000 UNITS CAPS capsule; Take 1 capsule (50,000 Units total) by mouth every 7 (seven) days. -     traZODone (DESYREL) 100 MG tablet; TAKE 1-3 TABS AT BEDTIME ONLY AS NEEDED. -     ferrous sulfate 325 (65 FE) MG tablet; Take 1 tablet (325 mg total) by mouth 2 (two) times daily with a meal.   Essential hypertension: Controlled continue lisinopril hydrochlorothiazide and additional hydrochlorothiazide Migraines: Uncontrolled, start Imitrex Generalized anxiety disorder: Controlled with as needed use of Xanax Right hand pain: Continue hydrocodone until able to see Kimberly Wade in sports medicine for consideration of additional injections Vitamin D deficiency: Continue weekly vitamin D Insomnia: Controlled with  trazodone  Return in about 3 months (around 05/05/2015).

## 2015-04-16 ENCOUNTER — Ambulatory Visit (INDEPENDENT_AMBULATORY_CARE_PROVIDER_SITE_OTHER): Payer: Medicaid Other

## 2015-04-16 ENCOUNTER — Encounter: Payer: Self-pay | Admitting: Family Medicine

## 2015-04-16 ENCOUNTER — Ambulatory Visit (INDEPENDENT_AMBULATORY_CARE_PROVIDER_SITE_OTHER): Payer: Medicaid Other | Admitting: Family Medicine

## 2015-04-16 VITALS — BP 130/83 | HR 72 | Temp 98.3°F | Wt 197.0 lb

## 2015-04-16 DIAGNOSIS — M79641 Pain in right hand: Secondary | ICD-10-CM | POA: Diagnosis not present

## 2015-04-16 DIAGNOSIS — R059 Cough, unspecified: Secondary | ICD-10-CM

## 2015-04-16 DIAGNOSIS — R0602 Shortness of breath: Secondary | ICD-10-CM | POA: Diagnosis not present

## 2015-04-16 DIAGNOSIS — R05 Cough: Secondary | ICD-10-CM | POA: Diagnosis not present

## 2015-04-16 DIAGNOSIS — F411 Generalized anxiety disorder: Secondary | ICD-10-CM | POA: Diagnosis not present

## 2015-04-16 MED ORDER — HYDROCODONE-ACETAMINOPHEN 5-325 MG PO TABS: 1.0000 | ORAL_TABLET | Freq: Every day | ORAL | Status: DC | PRN

## 2015-04-16 MED ORDER — ALPRAZOLAM 1 MG PO TABS: 1.0000 mg | ORAL_TABLET | Freq: Two times a day (BID) | ORAL | Status: DC | PRN

## 2015-04-16 MED ORDER — AZITHROMYCIN 250 MG PO TABS: ORAL_TABLET | ORAL | Status: AC

## 2015-04-16 MED ORDER — METHYLPREDNISOLONE ACETATE 80 MG/ML IJ SUSP
80.0000 mg | Freq: Once | INTRAMUSCULAR | Status: AC
  Administered 2015-04-16: 80 mg via INTRAMUSCULAR

## 2015-04-16 MED ORDER — HYDROCODONE-HOMATROPINE 5-1.5 MG/5ML PO SYRP: 5.0000 mL | ORAL_SOLUTION | Freq: Three times a day (TID) | ORAL | Status: DC | PRN

## 2015-04-16 NOTE — Progress Notes (Signed)
CC: Kimberly Wade is a 44 y.o. female is here for cough since sat   Subjective: HPI:  3 days of cough which is nonproductive moderate severity but interfering with sleep. Symptoms are present all hours of the day. Worse when lying down at night. Also, by shortness of breath with activity such as walking. Symptoms have been persistent since onset around Friday. Not getting better or worse. No benefit from over-the-counter cough and cold medication. She's had wheezing or chest pain.some fatigue but denies fevers. She denies any   she is requesting refills for hydrocodone which she takes for chronic right hand pain. She takes this a few days a week the pain is worse than moderate in severity. She is currently happy with her current regimen and denies any decreased quality of life    she is requesting refills on Xanax which she takes occasionally for anxiety. She states that anxiety is her opinion well controlled right now. She denies any known side effects. She denies depression or any other mental disturbance   Review Of Systems Outlined In HPI  Past Medical History  Diagnosis Date  . Anxiety   . GERD (gastroesophageal reflux disease)   . Insomnia   . Pneumonia   . Hypertension   . Hyperlipidemia   . Pneumococcal pneumonia 2000  . Diabetes     States after gastric Bypass Not a diabetic now    Past Surgical History  Procedure Laterality Date  . Cesarean section      x4  . Chest tube insertion    . Gastric bypass    . Dysplastic mole removed      right ankle  . Chest tubes  2000  . Recoonstruction of right lung    . Mini gastric bypass  2011  . Video bronchoscopy Bilateral 04/20/2013    Procedure: VIDEO BRONCHOSCOPY WITH FLUORO;  Surgeon: Elsie Stain, MD;  Location: [facility];  Service: Cardiopulmonary;  Laterality: Bilateral;  . Iud removal N/A 07/11/2014    Procedure: INTRAUTERINE DEVICE (IUD) REMOVAL;  Surgeon: Emily Filbert, MD;  Location: [facility];  Service: Gynecology;   Laterality: N/A;  . Intrauterine device (iud) insertion N/A 07/11/2014    Procedure: INTRAUTERINE DEVICE (IUD) INSERTION;  Surgeon: Emily Filbert, MD;  Location: [facility];  Service: Gynecology;  Laterality: N/A;   Family History  Problem Relation Age of Onset  . Colon cancer      grandmother  . Heart attack      grandfather  . Diabetes Father     father  . Hypertension Father   . Hyperlipidemia    . Hypertension Mother   . Asthma Daughter     Social History   Social History  . Marital Status: Legally Separated    Spouse Name: N/A  . Number of Children: N/A  . Years of Education: N/A   Occupational History  . Advertising Accountant    Social History Main Topics  . Smoking status: Never Smoker   . Smokeless tobacco: Never Used  . Alcohol Use: Yes     Comment: 1 glass per month  . Drug Use: No  . Sexual Activity:    Partners: Male   Other Topics Concern  . Not on file   Social History Narrative     Objective: BP 130/83 mmHg  Pulse 72  Temp(Src) 98.3 F (36.8 C) (Oral)  Wt 197 lb (89.359 kg)  SpO2 100%  General: Alert and Oriented, No Acute Distress HEENT: Pupils equal, round,  reactive to light. Conjunctivae clear.  External ears unremarkable, canals clear with intact TMs with appropriate landmarks.  Middle ear appears open without effusion. Pink inferior turbinates.  Moist mucous membranes, pharynx without inflammation nor lesions.  Neck supple without palpable lymphadenopathy nor abnormal masses. Lungs: End expiratory wheezing in the posterior left lower lung field. No rhonchi or rales.Comfortable work of breathing. Good air movement. Extremities: No peripheral edema.  Strong peripheral pulses.  Mental Status: No depression, anxiety, nor agitation. Skin: Warm and dry.  Assessment & Plan: Kimberly Wade was seen today for cough since sat.  Diagnoses and all orders for this visit:  Cough -     DG Chest 2 View; Future -     HYDROcodone-homatropine (HYCODAN) 5-1.5  MG/5ML syrup; Take 5 mLs by mouth every 8 (eight) hours as needed for cough. -     methylPREDNISolone acetate (DEPO-MEDROL) injection 80 mg; Inject 1 mL (80 mg total) into the muscle once.  Right hand pain -     HYDROcodone-acetaminophen (NORCO/VICODIN) 5-325 MG per tablet; Take 1 tablet by mouth daily as needed for moderate pain or severe pain.  Generalized anxiety disorder -     ALPRAZolam (XANAX) 1 MG tablet; Take 1 tablet (1 mg total) by mouth 2 (two) times daily as needed for anxiety or sleep.  Other orders -     azithromycin (ZITHROMAX) 250 MG tablet; Take two tabs at once on day 1, then one tab daily on days 2-5.    cough: X-ray was obtained given her history of pneumonia with similar symptoms. Fortunately x-ray is unremarkable. Starting azithromycin, Depo-Medrol to help with wheezing component. Hycodan to help with sleeping.   right hand pain control with hydrocodone. Anxiety: Controlled with Xanax.  25 minutes spent face-to-face during visit today of which at least 50% was counseling or coordinating care regarding: 1. Cough   2. Right hand pain   3. Generalized anxiety disorder      Return if symptoms worsen or fail to improve.

## 2015-04-27 ENCOUNTER — Telehealth: Payer: Self-pay | Admitting: *Deleted

## 2015-04-27 DIAGNOSIS — R059 Cough, unspecified: Secondary | ICD-10-CM

## 2015-04-27 DIAGNOSIS — R05 Cough: Secondary | ICD-10-CM

## 2015-04-27 MED ORDER — HYDROCODONE-HOMATROPINE 5-1.5 MG/5ML PO SYRP: 5.0000 mL | ORAL_SOLUTION | Freq: Three times a day (TID) | ORAL | Status: DC | PRN

## 2015-04-27 NOTE — Telephone Encounter (Signed)
Pt is requesting a refill on the hycodan cough medication that was rx'ed on 8/29. Pt states she has been taking 1 tsp q 4-6 hrs and she is still coughing. She is requesting a refill on cough medication for another week

## 2015-04-27 NOTE — Telephone Encounter (Signed)
Kimberly Wade, Rx placed in in-box ready for pickup/faxing.  

## 2015-04-27 NOTE — Telephone Encounter (Signed)
Left at [facility]. Pt will pick up

## 2015-05-31 ENCOUNTER — Other Ambulatory Visit: Payer: Self-pay | Admitting: Family Medicine

## 2015-06-21 ENCOUNTER — Ambulatory Visit (INDEPENDENT_AMBULATORY_CARE_PROVIDER_SITE_OTHER): Payer: Medicaid Other | Admitting: Family Medicine

## 2015-06-21 ENCOUNTER — Encounter: Payer: Self-pay | Admitting: Family Medicine

## 2015-06-21 VITALS — BP 114/78 | HR 73 | Wt 195.0 lb

## 2015-06-21 DIAGNOSIS — F411 Generalized anxiety disorder: Secondary | ICD-10-CM

## 2015-06-21 DIAGNOSIS — M79641 Pain in right hand: Secondary | ICD-10-CM

## 2015-06-21 DIAGNOSIS — A499 Bacterial infection, unspecified: Secondary | ICD-10-CM

## 2015-06-21 DIAGNOSIS — B9689 Other specified bacterial agents as the cause of diseases classified elsewhere: Secondary | ICD-10-CM

## 2015-06-21 DIAGNOSIS — J329 Chronic sinusitis, unspecified: Secondary | ICD-10-CM | POA: Diagnosis not present

## 2015-06-21 DIAGNOSIS — R110 Nausea: Secondary | ICD-10-CM

## 2015-06-21 MED ORDER — ALPRAZOLAM 1 MG PO TABS: 1.0000 mg | ORAL_TABLET | Freq: Two times a day (BID) | ORAL | Status: DC | PRN

## 2015-06-21 MED ORDER — VITAMIN D (ERGOCALCIFEROL) 1.25 MG (50000 UNIT) PO CAPS: ORAL_CAPSULE | ORAL | Status: DC

## 2015-06-21 MED ORDER — HYDROCODONE-ACETAMINOPHEN 5-325 MG PO TABS: 1.0000 | ORAL_TABLET | Freq: Every day | ORAL | Status: DC | PRN

## 2015-06-21 MED ORDER — PROMETHAZINE HCL 25 MG PO TABS
25.0000 mg | ORAL_TABLET | Freq: Three times a day (TID) | ORAL | Status: DC | PRN
Start: 2015-06-21 — End: 2016-04-04

## 2015-06-21 MED ORDER — AZITHROMYCIN 250 MG PO TABS: ORAL_TABLET | ORAL | Status: AC

## 2015-06-21 NOTE — Progress Notes (Signed)
CC: Kimberly Wade is a 44 y.o. female is here for Nausea; Dizziness; and Headache   Subjective: HPI:  Right-sided forehead pain present on a daily basis for the last 2 weeks. Worse when sitting forward. Over the last weeks accompanied by postnasal drip and nasal congestion. Denies fevers, chills, cough or shortness of breath.  Woke up this morning with loose stools and nauseousness. Has not vomited yet. She's lost her appetite as of this morning. Denies confusion, abdominal pain, rash, fevers, chills, nor genitourinary complaints.  Continued right hand pain in the third knuckle, accompanied by mild swelling. Symptoms have been off and on for matter of years. She got some benefit from a steroid injection and is thinking about getting a repeat one. She would like a refill on hydrocodone which significantly helps with pain.  She would like a refill on Xanax. She takes this up to 2 times a day for anxiety. She denies any decreased in effectiveness or any side effects.  She's been getting lightheaded upon standing for the last few weeks. No outside blood pressures to report.   Review Of Systems Outlined In HPI  Past Medical History  Diagnosis Date  . Anxiety   . GERD (gastroesophageal reflux disease)   . Insomnia   . Pneumonia   . Hypertension   . Hyperlipidemia   . Pneumococcal pneumonia ([place]) 2000  . Diabetes ([place])     States after gastric Bypass Not a diabetic now    Past Surgical History  Procedure Laterality Date  . Cesarean section      x4  . Chest tube insertion    . Gastric bypass    . Dysplastic mole removed      right ankle  . Chest tubes  2000  . Recoonstruction of right lung    . Mini gastric bypass  2011  . Video bronchoscopy Bilateral 04/20/2013    Procedure: VIDEO BRONCHOSCOPY WITH FLUORO;  Surgeon: Elsie Stain, MD;  Location: [facility];  Service: Cardiopulmonary;  Laterality: Bilateral;  . Iud removal N/A 07/11/2014    Procedure: INTRAUTERINE DEVICE  (IUD) REMOVAL;  Surgeon: Emily Filbert, MD;  Location: [facility];  Service: Gynecology;  Laterality: N/A;  . Intrauterine device (iud) insertion N/A 07/11/2014    Procedure: INTRAUTERINE DEVICE (IUD) INSERTION;  Surgeon: Emily Filbert, MD;  Location: [facility];  Service: Gynecology;  Laterality: N/A;   Family History  Problem Relation Age of Onset  . Colon cancer      grandmother  . Heart attack      grandfather  . Diabetes Father     father  . Hypertension Father   . Hyperlipidemia    . Hypertension Mother   . Asthma Daughter     Social History   Social History  . Marital Status: Legally Separated    Spouse Name: N/A  . Number of Children: N/A  . Years of Education: N/A   Occupational History  . Advertising Accountant    Social History Main Topics  . Smoking status: Never Smoker   . Smokeless tobacco: Never Used  . Alcohol Use: Yes     Comment: 1 glass per month  . Drug Use: No  . Sexual Activity:    Partners: Male   Other Topics Concern  . Not on file   Social History Narrative     Objective: BP 114/78 mmHg  Pulse 73  Wt 195 lb (88.451 kg)  General: Alert and Oriented, No Acute Distress HEENT: Pupils equal,  round, reactive to light. Conjunctivae clear.  External ears unremarkable, canals clear with intact TMs with appropriate landmarks.  Middle ear appears open without effusion. Pink inferior turbinates.  Moist mucous membranes, pharynx without inflammation nor lesions.  Neck supple without palpable lymphadenopathy nor abnormal masses. Lungs: Clear to auscultation bilaterally, no wheezing/ronchi/rales.  Comfortable work of breathing. Good air movement. Cardiac: Regular rate and rhythm. Normal S1/S2.  No murmurs, rubs, nor gallops.   Abdomen: soft nontender Extremities: No peripheral edema.  Strong peripheral pulses.  Mental Status: No depression, anxiety, nor agitation. Skin: Warm and dry.  Assessment & Plan: Kimberly Wade was seen today for nausea, dizziness and  headache.  Diagnoses and all orders for this visit:  Bacterial sinusitis -     azithromycin (ZITHROMAX) 250 MG tablet; Take two tabs at once on day 1, then one tab daily on days 2-5.  Nausea without vomiting -     promethazine (PHENERGAN) 25 MG tablet; Take 1 tablet (25 mg total) by mouth every 8 (eight) hours as needed for nausea or vomiting.  Right hand pain -     HYDROcodone-acetaminophen (NORCO/VICODIN) 5-325 MG tablet; Take 1 tablet by mouth daily as needed for moderate pain or severe pain.  Generalized anxiety disorder -     ALPRAZolam (XANAX) 1 MG tablet; Take 1 tablet (1 mg total) by mouth 2 (two) times daily as needed for anxiety or sleep.  Other orders -     Vitamin D, Ergocalciferol, (DRISDOL) 50000 UNITS CAPS capsule; TAKE 1 CAPSULE BY MOUTH EVERY 7 DAYS. Due for follow up appointment.   Bacterial sinusitis: Start azithromycin Nausea: Suspect viral gastroenteritis therefore start promethazine Right hand pain: Controlled with hydrocodone, agree with her intention to pursue possible steroid injection Anxiety: Controlled with Xanax. Stop all blood pressure medication and follow-up in 2 weeks to see if this helps with lightheadedness.  Return for 2-4 weeks for BP.

## 2015-07-09 ENCOUNTER — Ambulatory Visit (INDEPENDENT_AMBULATORY_CARE_PROVIDER_SITE_OTHER): Payer: Medicaid Other | Admitting: Sports Medicine

## 2015-07-09 ENCOUNTER — Encounter: Payer: Self-pay | Admitting: Family Medicine

## 2015-07-09 ENCOUNTER — Ambulatory Visit (INDEPENDENT_AMBULATORY_CARE_PROVIDER_SITE_OTHER): Payer: Medicaid Other | Admitting: Family Medicine

## 2015-07-09 ENCOUNTER — Encounter: Payer: Self-pay | Admitting: Sports Medicine

## 2015-07-09 VITALS — BP 135/85 | HR 102 | Wt 202.0 lb

## 2015-07-09 VITALS — BP 154/88 | HR 102 | Wt 202.0 lb

## 2015-07-09 DIAGNOSIS — M62830 Muscle spasm of back: Secondary | ICD-10-CM

## 2015-07-09 DIAGNOSIS — M19041 Primary osteoarthritis, right hand: Secondary | ICD-10-CM

## 2015-07-09 DIAGNOSIS — I10 Essential (primary) hypertension: Secondary | ICD-10-CM

## 2015-07-09 NOTE — Assessment & Plan Note (Signed)
Ten-month response to previous right third metacarpophalangeal joint injection. Repeat injection today, Buddy taped the third and fourth fingers. Return to see me on an as-needed basis.

## 2015-07-09 NOTE — Progress Notes (Signed)
  Subjective:    CC: Right hand pain  HPI: This is a pleasant 44 year old female, 10 months ago we injected her right third metacarpal phalangeal joint and she had fantastic response until now, rheumatoid workup was negative. Unfortunately she is starting to have a recurrence of pain and desires repeat intervention today, pain is localized, moderate, persistent without radiation.  Past medical history, Surgical history, Family history not pertinant except as noted below, Social history, Allergies, and medications have been entered into the medical record, reviewed, and no changes needed.   Review of Systems: No fevers, chills, night sweats, weight loss, chest pain, or shortness of breath.   Objective:    General: Well Developed, well nourished, and in no acute distress.  Neuro: Alert and oriented x3, extra-ocular muscles intact, sensation grossly intact.  HEENT: Normocephalic, atraumatic, pupils equal round reactive to light, neck supple, no masses, no lymphadenopathy, thyroid nonpalpable.  Skin: Warm and dry, no rashes. Cardiac: Regular rate and rhythm, no murmurs rubs or gallops, no lower extremity edema.  Respiratory: Clear to auscultation bilaterally. Not using accessory muscles, speaking in full sentences. Right hand: Tender to palpation at the third metatarsal phalangeal joint both on the volar and palmar aspects.  Procedure: Real-time Ultrasound Guided Injection of right third MCP Device: GE Logiq E  Verbal informed consent obtained.  Time-out conducted.  Noted no overlying erythema, induration, or other signs of local infection.  Skin prepped in a sterile fashion.  Local anesthesia: Topical Ethyl chloride.  With sterile technique and under real time ultrasound guidance:  0.5 mL kenalog 40, 0.5 mL lidocaine injected easily. Completed without difficulty  Pain immediately resolved suggesting accurate placement of the medication.  Advised to call if fevers/chills, erythema,  induration, drainage, or persistent bleeding.  Images permanently stored and available for review in the ultrasound unit.  Impression: Technically successful ultrasound guided injection.  Impression and Recommendations:

## 2015-07-09 NOTE — Progress Notes (Signed)
CC: Kimberly Wade is a 44 y.o. female is here for Hypertension   Subjective: HPI:  Follow-up essential hypertension: Since I saw her last she stopped taking HCTZ. She's no longer having orthostatic hypotension symptoms. She tells me she feels the same as she used to other than having some back spasms. She denies chest pain shortness of breath orthopnea nor peripheral edema  Complains of back pain localized on the right side of the back radiates from the base of the skull down her back to the back of the pelvis. It occurs only when she's at rest or sitting. It lasts only a matter of seconds. Pain is enough to where she'll stop breathing for a few seconds until it passes.she denies any leg or arm pain. She's never had this before. It's been going on for the last week. Nothing seems to make it better or worse. Denies shortness of breath, cough, wheezing, skin changes or midline back pain.   Review Of Systems Outlined In HPI  Past Medical History  Diagnosis Date  . Anxiety   . GERD (gastroesophageal reflux disease)   . Insomnia   . Pneumonia   . Hypertension   . Hyperlipidemia   . Pneumococcal pneumonia ([place]) 2000  . Diabetes ([place])     States after gastric Bypass Not a diabetic now    Past Surgical History  Procedure Laterality Date  . Cesarean section      x4  . Chest tube insertion    . Gastric bypass    . Dysplastic mole removed      right ankle  . Chest tubes  2000  . Recoonstruction of right lung    . Mini gastric bypass  2011  . Video bronchoscopy Bilateral 04/20/2013    Procedure: VIDEO BRONCHOSCOPY WITH FLUORO;  Surgeon: Elsie Stain, MD;  Location: [facility];  Service: Cardiopulmonary;  Laterality: Bilateral;  . Iud removal N/A 07/11/2014    Procedure: INTRAUTERINE DEVICE (IUD) REMOVAL;  Surgeon: Emily Filbert, MD;  Location: [facility];  Service: Gynecology;  Laterality: N/A;  . Intrauterine device (iud) insertion N/A 07/11/2014    Procedure: INTRAUTERINE DEVICE  (IUD) INSERTION;  Surgeon: Emily Filbert, MD;  Location: [facility];  Service: Gynecology;  Laterality: N/A;   Family History  Problem Relation Age of Onset  . Colon cancer      grandmother  . Heart attack      grandfather  . Diabetes Father     father  . Hypertension Father   . Hyperlipidemia    . Hypertension Mother   . Asthma Daughter     Social History   Social History  . Marital Status: Legally Separated    Spouse Name: N/A  . Number of Children: N/A  . Years of Education: N/A   Occupational History  . Advertising Accountant    Social History Main Topics  . Smoking status: Never Smoker   . Smokeless tobacco: Never Used  . Alcohol Use: Yes     Comment: 1 glass per month  . Drug Use: No  . Sexual Activity:    Partners: Male   Other Topics Concern  . Not on file   Social History Narrative     Objective: BP 135/85 mmHg  Pulse 102  Wt 202 lb (91.627 kg)  General: Alert and Oriented, No Acute Distress HEENT: Pupils equal, round, reactive to light. Conjunctivae clear.  Moist mucous membranes Lungs: Clear to auscultation bilaterally, no wheezing/ronchi/rales.  Comfortable work of breathing. Good  air movement. Cardiac: Regular rate and rhythm. Normal S1/S2.  No murmurs, rubs, nor gallops.   Extremities: No peripheral edema.  Strong peripheral pulses.  Mental Status: No depression, anxiety, nor agitation. Skin: Warm and dry.  Assessment & Plan: Kimberly Wade was seen today for hypertension.  Diagnoses and all orders for this visit:  Essential hypertension, benign  Back spasm   Essential hypertension: Controlled in the prehypertensive range of asked her to check her blood pressure daily for the next week and drop off home readings Back spasm: provided with home rehabilitation plan to be done on a daily basis for the next 2-3 weeks.  25 minutes spent face-to-face during visit today of which at least 50% was counseling or coordinating care regarding: 1. Essential  hypertension, benign   2. Back spasm      Return in about 3 months (around 10/09/2015).

## 2015-07-09 NOTE — Patient Instructions (Signed)
BP Values for one week.                   [name]

## 2015-08-27 ENCOUNTER — Ambulatory Visit: Payer: Medicaid Other | Admitting: Physician Assistant

## 2015-09-04 ENCOUNTER — Ambulatory Visit: Payer: Medicaid Other | Admitting: Obstetrics & Gynecology

## 2015-09-04 ENCOUNTER — Encounter: Payer: Self-pay | Admitting: Family Medicine

## 2015-09-04 ENCOUNTER — Ambulatory Visit (INDEPENDENT_AMBULATORY_CARE_PROVIDER_SITE_OTHER): Payer: BLUE CROSS/BLUE SHIELD | Admitting: Family Medicine

## 2015-09-04 VITALS — BP 105/69 | HR 70 | Wt 198.0 lb

## 2015-09-04 DIAGNOSIS — F411 Generalized anxiety disorder: Secondary | ICD-10-CM | POA: Diagnosis not present

## 2015-09-04 DIAGNOSIS — M19041 Primary osteoarthritis, right hand: Secondary | ICD-10-CM | POA: Diagnosis not present

## 2015-09-04 DIAGNOSIS — M79641 Pain in right hand: Secondary | ICD-10-CM

## 2015-09-04 DIAGNOSIS — I10 Essential (primary) hypertension: Secondary | ICD-10-CM | POA: Diagnosis not present

## 2015-09-04 DIAGNOSIS — H9202 Otalgia, left ear: Secondary | ICD-10-CM

## 2015-09-04 MED ORDER — HYDROCODONE-ACETAMINOPHEN 5-325 MG PO TABS: 1.0000 | ORAL_TABLET | Freq: Every day | ORAL | Status: DC | PRN

## 2015-09-04 MED ORDER — VITAMIN D (ERGOCALCIFEROL) 1.25 MG (50000 UNIT) PO CAPS: ORAL_CAPSULE | ORAL | Status: DC

## 2015-09-04 MED ORDER — ALPRAZOLAM 1 MG PO TABS: 1.0000 mg | ORAL_TABLET | Freq: Two times a day (BID) | ORAL | Status: DC | PRN

## 2015-09-04 MED ORDER — HYDROCHLOROTHIAZIDE 25 MG PO TABS: 25.0000 mg | ORAL_TABLET | Freq: Every day | ORAL | Status: DC

## 2015-09-04 MED ORDER — TRAZODONE HCL 100 MG PO TABS: ORAL_TABLET | ORAL | Status: DC

## 2015-09-04 MED ORDER — DOXYCYCLINE HYCLATE 100 MG PO TABS: ORAL_TABLET | ORAL | Status: AC

## 2015-09-04 NOTE — Progress Notes (Signed)
CC: Kimberly Wade is a 45 y.o. female is here for Ear Pain   Subjective: HPI:  Complains of left ear pain that's been present for the past week. It's worse with pressing on the ear or tilting the head to the left. No intervention as of yet. Accompanied by nasal congestion last week but this resolved on its own. She denies hearing loss or drainage from the ears. She denies headache or fevers.  Follow-up anxiety: She is requesting a refill on Xanax.upper school from November has lasted her up until now. She takes most days of the week. No known side effects. She denies loss of effectiveness or tolerance. Anxiety does not interfere with quality of life provided she takes this twice a day.  She is requesting a refill on hydrochlorothiazide. All blood pressures outside of our office on the normotensive range. No chest pain shortness of breath or peripheral edema.  She is requesting a refill on hydrocodone for right hand pain. Pain is only present on the days that she works.   Review Of Systems Outlined In HPI  Past Medical History  Diagnosis Date  . Anxiety   . GERD (gastroesophageal reflux disease)   . Insomnia   . Pneumonia   . Hypertension   . Hyperlipidemia   . Pneumococcal pneumonia ([place]) 2000  . Diabetes ([place])     States after gastric Bypass Not a diabetic now    Past Surgical History  Procedure Laterality Date  . Cesarean section      x4  . Chest tube insertion    . Gastric bypass    . Dysplastic mole removed      right ankle  . Chest tubes  2000  . Recoonstruction of right lung    . Mini gastric bypass  2011  . Video bronchoscopy Bilateral 04/20/2013    Procedure: VIDEO BRONCHOSCOPY WITH FLUORO;  Surgeon: Elsie Stain, MD;  Location: [facility];  Service: Cardiopulmonary;  Laterality: Bilateral;  . Iud removal N/A 07/11/2014    Procedure: INTRAUTERINE DEVICE (IUD) REMOVAL;  Surgeon: Emily Filbert, MD;  Location: [facility];  Service: Gynecology;  Laterality: N/A;  .  Intrauterine device (iud) insertion N/A 07/11/2014    Procedure: INTRAUTERINE DEVICE (IUD) INSERTION;  Surgeon: Emily Filbert, MD;  Location: [facility];  Service: Gynecology;  Laterality: N/A;   Family History  Problem Relation Age of Onset  . Colon cancer      grandmother  . Heart attack      grandfather  . Diabetes Father     father  . Hypertension Father   . Hyperlipidemia    . Hypertension Mother   . Asthma Daughter     Social History   Social History  . Marital Status: Legally Separated    Spouse Name: N/A  . Number of Children: N/A  . Years of Education: N/A   Occupational History  . Advertising Accountant    Social History Main Topics  . Smoking status: Never Smoker   . Smokeless tobacco: Never Used  . Alcohol Use: Yes     Comment: 1 glass per month  . Drug Use: No  . Sexual Activity:    Partners: Male   Other Topics Concern  . Not on file   Social History Narrative     Objective: BP 105/69 mmHg  Pulse 70  Wt 198 lb (89.812 kg)  General: Alert and Oriented, No Acute Distress HEENT: Pupils equal, round, reactive to light. Conjunctivae clear.  External ears  unremarkable, canals clear with intact TMs with appropriate landmarks.  Middle ear appears open without effusion. Pink inferior turbinates.  Moist mucous membranes, pharynx without inflammation nor lesions.  Neck supple without palpable lymphadenopathy nor abnormal masses. Lungs: Clear to auscultation bilaterally, no wheezing/ronchi/rales.  Comfortable work of breathing. Good air movement. Cardiac: Regular rate and rhythm. Normal S1/S2.  No murmurs, rubs, nor gallops.   Extremities: No peripheral edema.  Strong peripheral pulses.no swelling redness or warmth of any of the joints in the right hand full range of motion of his hand  Mental Status: No depression, anxiety, nor agitation. Skin: Warm and dry.  Assessment & Plan: Kimberly Wade was seen today for ear pain.  Diagnoses and all orders for this  visit:  Right hand pain -     HYDROcodone-acetaminophen (NORCO/VICODIN) 5-325 MG tablet; Take 1 tablet by mouth daily as needed for moderate pain or severe pain.  Generalized anxiety disorder -     ALPRAZolam (XANAX) 1 MG tablet; Take 1 tablet (1 mg total) by mouth 2 (two) times daily as needed for anxiety or sleep.  Essential hypertension, benign  Primary osteoarthritis of right hand  Left ear pain  Other orders -     hydrochlorothiazide (HYDRODIURIL) 25 MG tablet; Take 1 tablet (25 mg total) by mouth daily. -     Vitamin D, Ergocalciferol, (DRISDOL) 50000 units CAPS capsule; TAKE 1 CAPSULE BY MOUTH EVERY 7 DAYS. -     traZODone (DESYREL) 100 MG tablet; TAKE 1 TO 3 TABLET BY MOUTH AT BEDTIME ONLY IF NEEDED. -     doxycycline (VIBRA-TABS) 100 MG tablet; One by mouth twice a day for ten days.   Right hand pain: Refills of Norco Generalized anxiety disorder: Controlled with alprazolam. Essential hypertension: Controlled with hydrochlorothiazide Left ear pain is most likely due to eustachian tube dysfunction therefore start doxycycline and call if no better by Friday, next step would be prednisone.   Return if symptoms worsen or fail to improve.

## 2015-09-11 ENCOUNTER — Ambulatory Visit (INDEPENDENT_AMBULATORY_CARE_PROVIDER_SITE_OTHER): Payer: BLUE CROSS/BLUE SHIELD | Admitting: Obstetrics & Gynecology

## 2015-09-11 ENCOUNTER — Encounter: Payer: Self-pay | Admitting: Obstetrics & Gynecology

## 2015-09-11 VITALS — BP 99/56 | HR 77 | Resp 16 | Ht 66.0 in | Wt 196.0 lb

## 2015-09-11 DIAGNOSIS — N921 Excessive and frequent menstruation with irregular cycle: Secondary | ICD-10-CM | POA: Diagnosis not present

## 2015-09-12 ENCOUNTER — Ambulatory Visit (INDEPENDENT_AMBULATORY_CARE_PROVIDER_SITE_OTHER): Payer: BLUE CROSS/BLUE SHIELD

## 2015-09-12 DIAGNOSIS — N921 Excessive and frequent menstruation with irregular cycle: Secondary | ICD-10-CM

## 2015-09-12 DIAGNOSIS — N83201 Unspecified ovarian cyst, right side: Secondary | ICD-10-CM | POA: Diagnosis not present

## 2015-09-12 LAB — CBC
HEMATOCRIT: 33.7 % — AB (ref 36.0–46.0)
Hemoglobin: 10.7 g/dL — ABNORMAL LOW (ref 12.0–15.0)
MCH: 27.8 pg (ref 26.0–34.0)
MCHC: 31.8 g/dL (ref 30.0–36.0)
MCV: 87.5 fL (ref 78.0–100.0)
MPV: 10.4 fL (ref 8.6–12.4)
PLATELETS: 264 10*3/uL (ref 150–400)
RBC: 3.85 MIL/uL — ABNORMAL LOW (ref 3.87–5.11)
RDW: 14.2 % (ref 11.5–15.5)
WBC: 6.7 10*3/uL (ref 4.0–10.5)

## 2015-09-12 LAB — TSH: TSH: 1.916 u[IU]/mL (ref 0.350–4.500)

## 2015-09-12 NOTE — Progress Notes (Signed)
   Subjective:    Patient ID: Kimberly Wade, female    DOB: Jul 23, 1971, 45 y.o.   MRN: [id number]  HPI  Pt presents with 3 months of bleeding.  Pt had a Mirena removed after 7 years in November 2015.  SHe had a Nexplanon placed at that time.  She has had a monthly menses that was bothersome but tolerable.  Her last menses was  Months with clots and sometimes soaking a pad every 30 minutes.  It is brown today.  She is worried and wants it to stop.  Nothing makes it better or worse.  There is no pain associated with bleeding.    Review of Systems  Constitutional: Negative.   Respiratory: Negative.   Cardiovascular: Negative.   Gastrointestinal: Negative.   Genitourinary: Negative.   Psychiatric/Behavioral: Negative.        Objective:   Physical Exam  Constitutional: She is oriented to person, place, and time. She appears well-developed and well-nourished. No distress.  HENT:  Head: Normocephalic and atraumatic.  Pulmonary/Chest: Effort normal.  Abdominal: Soft. She exhibits no distension. There is no tenderness. There is no guarding.  Genitourinary: Vagina normal.  Vulva: no lesion,  Vagina:  Brown blood Cervix:  NO lesion Uterus:  ? Slightly enlarged, anteverted Adnexa:  Non tender.  ? Right sided mass versus enlarge uterus.  Neurological: She is alert and oriented to person, place, and time.  Skin: Skin is warm and dry.  Psychiatric: She has a normal mood and affect.  Vitals reviewed.   Filed Vitals:   09/11/15 1458  BP: 99/56  Pulse: 77  Resp: 16  Height: 5\' 6"  (1.676 m)  Weight: 196 lb (88.905 kg)         Assessment & Plan:  45 yo female with menorrhagia lasting 3 months  1-check CBC and TSH 2-TVUS 3-Endometrial biopsy (pt can't urinate for pregnancy test) 4.  Return in 1 day to 1 week.

## 2015-09-14 ENCOUNTER — Encounter: Payer: Self-pay | Admitting: Obstetrics & Gynecology

## 2015-09-14 DIAGNOSIS — N83201 Unspecified ovarian cyst, right side: Secondary | ICD-10-CM | POA: Insufficient documentation

## 2015-09-18 ENCOUNTER — Ambulatory Visit (INDEPENDENT_AMBULATORY_CARE_PROVIDER_SITE_OTHER): Payer: BLUE CROSS/BLUE SHIELD | Admitting: Obstetrics & Gynecology

## 2015-09-18 ENCOUNTER — Encounter: Payer: Self-pay | Admitting: Obstetrics & Gynecology

## 2015-09-18 VITALS — BP 105/64 | HR 74 | Resp 16 | Ht 66.0 in | Wt 201.0 lb

## 2015-09-18 DIAGNOSIS — D500 Iron deficiency anemia secondary to blood loss (chronic): Secondary | ICD-10-CM

## 2015-09-18 DIAGNOSIS — Z3046 Encounter for surveillance of implantable subdermal contraceptive: Secondary | ICD-10-CM

## 2015-09-18 DIAGNOSIS — N83291 Other ovarian cyst, right side: Secondary | ICD-10-CM

## 2015-09-18 DIAGNOSIS — Z01812 Encounter for preprocedural laboratory examination: Secondary | ICD-10-CM | POA: Diagnosis not present

## 2015-09-18 DIAGNOSIS — N921 Excessive and frequent menstruation with irregular cycle: Secondary | ICD-10-CM | POA: Insufficient documentation

## 2015-09-18 DIAGNOSIS — D259 Leiomyoma of uterus, unspecified: Secondary | ICD-10-CM

## 2015-09-18 LAB — POCT URINE PREGNANCY: PREG TEST UR: NEGATIVE

## 2015-09-18 MED ORDER — MISOPROSTOL 200 MCG PO TABS: ORAL_TABLET | ORAL | Status: DC

## 2015-09-18 NOTE — Progress Notes (Signed)
   Subjective:    Patient ID: Kimberly Wade, female    DOB: Feb 03, 1971, 45 y.o.   MRN: [id number]  HPI  Pt presents for test results and endometrial biopsy.  Pt has nml TSH.  She has chronic iron deficiency anemia which is stable from 1 year ago.  US shows nml uterus with 2 small fibroids not near cavity.  There is a 4.9 cm simple cyst in right ovary.  Pt does not have symptoms from it.  Discussed no need for follow up, but if patient would feel better to have f/u US we can order in 8 weeks.  Review of Systems  Constitutional: Negative.   Respiratory: Negative.   Cardiovascular: Negative.   Gastrointestinal: Negative.   Genitourinary: Positive for vaginal bleeding and menstrual problem.       Objective:   Physical Exam  Constitutional: She is oriented to person, place, and time. She appears well-developed and well-nourished. No distress.  HENT:  Head: Normocephalic and atraumatic.  Eyes: Conjunctivae are normal.  Pulmonary/Chest: Effort normal.  Abdominal: Soft. Bowel sounds are normal. She exhibits no distension and no mass. There is no tenderness. There is no rebound and no guarding.  Genitourinary:  Tanner V Vulva no lesion Vagina-scant old blood, no lesion Cervix-no lesion, no CMT Uterus NT  Musculoskeletal: She exhibits no edema.  Nexplanon felt in left arm  Neurological: She is alert and oriented to person, place, and time.  Skin: Skin is warm and dry.  Psychiatric: She has a normal mood and affect.  Vitals reviewed.     Assessment & Plan:  46 yo female with metrorrhagia with Nexplanon  1-Endometrial biopsy 2-If nml will remove nexplanon and place Mirena at next visit.l  ENDOMETRIAL BIOPSY     The indications for endometrial biopsy were reviewed.   Risks of the biopsy including cramping, bleeding, infection, uterine perforation, inadequate specimen and need for additional procedures  were discussed. The patient states she understands and agrees to undergo  procedure today. Consent was signed. Time out was performed. Urine HCG was negative. A sterile speculum was placed in the patient's vagina and the cervix was prepped with Betadine. A single-toothed tenaculum was placed on the anterior lip of the cervix to stabilize it. The 3 mm pipelle was introduced into the endometrial cavity without difficulty to a depth of 10 cm, and a moderate amount of tissue was obtained and sent to pathology. The instruments were removed from the patient's vagina. Minimal bleeding from the cervix was noted. The patient tolerated the procedure well. Routine post-procedure instructions were given to the patient. The patient will follow up to review the results and for further management.

## 2015-09-20 ENCOUNTER — Telehealth: Payer: Self-pay | Admitting: *Deleted

## 2015-09-20 NOTE — Telephone Encounter (Signed)
Pt notified of neg Endometrial biopsy

## 2015-09-20 NOTE — Telephone Encounter (Signed)
-----   Message from Guss Bunde, MD sent at 09/18/2015  3:46 PM EST ----- I prescribed the pt cytotec prior to procedure.  She needs a phone call (after biopsy results are back) to pick up medication.

## 2015-09-20 NOTE — Telephone Encounter (Signed)
LM on voicemail that she had a RX for Cytotec @ [facility] to use 4-6 hrs prior to her next procedure.

## 2015-09-20 NOTE — Telephone Encounter (Signed)
-----   Message from Guss Bunde, MD sent at 09/20/2015  3:23 PM EST ----- Negative endometrial biopsy

## 2015-09-25 ENCOUNTER — Ambulatory Visit: Payer: BLUE CROSS/BLUE SHIELD | Admitting: Obstetrics & Gynecology

## 2015-10-01 ENCOUNTER — Ambulatory Visit (INDEPENDENT_AMBULATORY_CARE_PROVIDER_SITE_OTHER): Payer: BLUE CROSS/BLUE SHIELD | Admitting: Obstetrics & Gynecology

## 2015-10-01 ENCOUNTER — Encounter: Payer: Self-pay | Admitting: Obstetrics & Gynecology

## 2015-10-01 VITALS — BP 101/58 | HR 71 | Resp 16 | Ht 66.0 in | Wt 196.0 lb

## 2015-10-01 DIAGNOSIS — Z3046 Encounter for surveillance of implantable subdermal contraceptive: Secondary | ICD-10-CM

## 2015-10-01 DIAGNOSIS — Z30430 Encounter for insertion of intrauterine contraceptive device: Secondary | ICD-10-CM | POA: Diagnosis not present

## 2015-10-01 DIAGNOSIS — Z01812 Encounter for preprocedural laboratory examination: Secondary | ICD-10-CM | POA: Diagnosis not present

## 2015-10-01 LAB — POCT URINE PREGNANCY: PREG TEST UR: NEGATIVE

## 2015-10-01 MED ORDER — LEVONORGESTREL 20 MCG/24HR IU IUD
INTRAUTERINE_SYSTEM | Freq: Once | INTRAUTERINE | Status: AC
  Administered 2015-10-01: 16:00:00 via INTRAUTERINE

## 2015-10-01 NOTE — Progress Notes (Signed)
Kimberly Wade is a 45 y.o. [id number] here for Nexplanon removal and IUD insertion.  Unhappy with bleeding on Nexplanon.  Has had Mirena in the past and liked it very much.    Nexplanon Removal Patient was given informed consent for removal of her Nexplanon.  Appropriate time out taken. Nexplanon site identified.  Area prepped in usual sterile fashon. One ml of 1% lidocaine was used to anesthetize the area at the distal end of the implant. A small stab incision was made right beside the implant on the distal portion.  The Nexplanon rod was grasped using hemostats and removed without difficulty.  There was minimal blood loss. There were no complications.  A small amount of antibiotic ointment and steri-strips were applied over the small incision.  A pressure bandage was applied to reduce any bruising.  The patient tolerated the procedure well and was given post procedure instructions.    IUD Procedure Note Patient identified, informed consent performed.  Discussed risks of irregular bleeding, cramping, infection, malpositioning or misplacement of the IUD outside the uterus which may require further procedures. Time out was performed.  Urine pregnancy test negative.  Speculum placed in the vagina.  Cervix visualized.  Cleaned with Betadine x 2.  Grasped anteriorly with a single tooth tenaculum.  Uterus sounded to 10 cm.  Mirena IUD placed per manufacturer's recommendations.  Strings trimmed to 3 cm. Tenaculum was removed, good hemostasis noted.  Patient tolerated procedure well.   Patient was given post-procedure instructions and the Mirena care card with expiration date.  Patient was also asked to check IUD strings periodically and follow up in 4-6 weeks for IUD check.

## 2015-10-26 ENCOUNTER — Encounter: Payer: Self-pay | Admitting: Family Medicine

## 2015-10-26 ENCOUNTER — Ambulatory Visit (INDEPENDENT_AMBULATORY_CARE_PROVIDER_SITE_OTHER): Payer: BLUE CROSS/BLUE SHIELD | Admitting: Family Medicine

## 2015-10-26 VITALS — BP 110/72 | HR 71 | Wt 195.0 lb

## 2015-10-26 DIAGNOSIS — E559 Vitamin D deficiency, unspecified: Secondary | ICD-10-CM

## 2015-10-26 DIAGNOSIS — D649 Anemia, unspecified: Secondary | ICD-10-CM

## 2015-10-26 DIAGNOSIS — E119 Type 2 diabetes mellitus without complications: Secondary | ICD-10-CM | POA: Diagnosis not present

## 2015-10-26 DIAGNOSIS — F411 Generalized anxiety disorder: Secondary | ICD-10-CM

## 2015-10-26 DIAGNOSIS — M79641 Pain in right hand: Secondary | ICD-10-CM

## 2015-10-26 LAB — POCT GLYCOSYLATED HEMOGLOBIN (HGB A1C): HEMOGLOBIN A1C: 5.1

## 2015-10-26 LAB — VITAMIN B12: VITAMIN B 12: 470 pg/mL (ref 200–1100)

## 2015-10-26 LAB — FOLATE

## 2015-10-26 LAB — IRON: IRON: 127 ug/dL (ref 40–190)

## 2015-10-26 MED ORDER — LEVOCETIRIZINE DIHYDROCHLORIDE 5 MG PO TABS: 5.0000 mg | ORAL_TABLET | Freq: Every evening | ORAL | Status: DC

## 2015-10-26 MED ORDER — ALPRAZOLAM 1 MG PO TABS: 1.0000 mg | ORAL_TABLET | Freq: Two times a day (BID) | ORAL | Status: DC | PRN

## 2015-10-26 MED ORDER — LISINOPRIL-HYDROCHLOROTHIAZIDE 20-12.5 MG PO TABS: 1.0000 | ORAL_TABLET | Freq: Every day | ORAL | Status: DC

## 2015-10-26 MED ORDER — HYDROCHLOROTHIAZIDE 25 MG PO TABS: 25.0000 mg | ORAL_TABLET | Freq: Every day | ORAL | Status: DC

## 2015-10-26 MED ORDER — VITAMIN D (ERGOCALCIFEROL) 1.25 MG (50000 UNIT) PO CAPS: ORAL_CAPSULE | ORAL | Status: DC

## 2015-10-26 MED ORDER — HYDROCODONE-ACETAMINOPHEN 5-325 MG PO TABS: 1.0000 | ORAL_TABLET | Freq: Every day | ORAL | Status: DC | PRN

## 2015-10-26 NOTE — Progress Notes (Signed)
CC: Kimberly Wade is a 45 y.o. female is here for Hyperglycemia; Ear Pain; and Nausea   Subjective: HPI:  Follow-up type 2 diabetes: She is currently trying to manage her blood sugars with diet and exercise alone. Has not been on antihypertensives next for years. She denies polyuria polyphagia or polydipsia  She was found to have a hemoglobin of 10 past month in the setting of heavy periods. She tells me that she feels sluggish most days out of the week. She denies shortness of breath or focal weakness. She's taking an iron supplement twice a day  Follow-up anxiety: She is requesting a refill on alprazolam. She takes this 1-2 times a day as needed for anxiety which is primarily driven by work responsibility.   She's requesting a refill on hydrocodone. She takes this once a day only as needed for right sided hand pain. She denies any swelling redness or warmth in any of the joints in her right hand. She denies any recent trauma.  She has a history of vitamin D deficiency and is taking weekly supplement. She is understandably curious if she has a significant deficiency that could be causing some of her fatigue and tiredness. Review Of Systems Outlined In HPI  Past Medical History  Diagnosis Date  . Anxiety   . GERD (gastroesophageal reflux disease)   . Insomnia   . Pneumonia   . Hypertension   . Hyperlipidemia   . Pneumococcal pneumonia ([place]) 2000  . Diabetes ([place])     States after gastric Bypass Not a diabetic now    Past Surgical History  Procedure Laterality Date  . Cesarean section      x4  . Chest tube insertion    . Gastric bypass    . Dysplastic mole removed      right ankle  . Chest tubes  2000  . Recoonstruction of right lung    . Mini gastric bypass  2011  . Video bronchoscopy Bilateral 04/20/2013    Procedure: VIDEO BRONCHOSCOPY WITH FLUORO;  Surgeon: Elsie Stain, MD;  Location: [facility];  Service: Cardiopulmonary;  Laterality: Bilateral;  . Iud removal  N/A 07/11/2014    Procedure: INTRAUTERINE DEVICE (IUD) REMOVAL;  Surgeon: Emily Filbert, MD;  Location: [facility];  Service: Gynecology;  Laterality: N/A;  . Intrauterine device (iud) insertion N/A 07/11/2014    Procedure: INTRAUTERINE DEVICE (IUD) INSERTION;  Surgeon: Emily Filbert, MD;  Location: [facility];  Service: Gynecology;  Laterality: N/A;   Family History  Problem Relation Age of Onset  . Colon cancer      grandmother  . Heart attack      grandfather  . Diabetes Father     father  . Hypertension Father   . Hyperlipidemia    . Hypertension Mother   . Asthma Daughter     Social History   Social History  . Marital Status: Legally Separated    Spouse Name: N/A  . Number of Children: N/A  . Years of Education: N/A   Occupational History  . Advertising Accountant    Social History Main Topics  . Smoking status: Never Smoker   . Smokeless tobacco: Never Used  . Alcohol Use: Yes     Comment: 1 glass per month  . Drug Use: No  . Sexual Activity:    Partners: Male    Birth Control/ Protection: Implant   Other Topics Concern  . Not on file   Social History Narrative  Objective: BP 110/72 mmHg  Pulse 71  Wt 195 lb (88.451 kg)  LMP 10/01/2015  Vital signs reviewed. General: Alert and Oriented, No Acute Distress HEENT: Pupils equal, round, reactive to light. Conjunctivae clear.  External ears unremarkable.  Moist mucous membranes. Lungs: Clear and comfortable work of breathing, speaking in full sentences without accessory muscle use. Cardiac: Regular rate and rhythm.  Neuro: CN II-XII grossly intact, gait normal. Extremities: No peripheral edema.  Strong peripheral pulses.  Mental Status: No depression, anxiety, nor agitation. Logical though process. Skin: Warm and dry.  Assessment & Plan: Kimberly Wade was seen today for hyperglycemia, ear pain and nausea.  Diagnoses and all orders for this visit:  Type 2 diabetes mellitus without complication, without long-term  current use of insulin (HCC)  Anemia, unspecified anemia type -     Vitamin B12 -     Iron -     Folate  Generalized anxiety disorder -     ALPRAZolam (XANAX) 1 MG tablet; Take 1 tablet (1 mg total) by mouth 2 (two) times daily as needed for anxiety or sleep.  Right hand pain -     HYDROcodone-acetaminophen (NORCO/VICODIN) 5-325 MG tablet; Take 1 tablet by mouth daily as needed for moderate pain or severe pain.  Vitamin D deficiency -     Vitamin D (25 hydroxy)  Other orders -     Vitamin D, Ergocalciferol, (DRISDOL) 50000 units CAPS capsule; TAKE 1 CAPSULE BY MOUTH EVERY 7 DAYS. -     hydrochlorothiazide (HYDRODIURIL) 25 MG tablet; Take 1 tablet (25 mg total) by mouth daily. -     levocetirizine (XYZAL) 5 MG tablet; Take 1 tablet (5 mg total) by mouth every evening. -     lisinopril-hydrochlorothiazide (ZESTORETIC) 20-12.5 MG tablet; Take 1 tablet by mouth daily.   Type 2 diabetes with an A1c of 5.1 today, controlled, continue diet and exercise regimen. Generalized anxiety: Controlled with Xanax Right hand pain: Controlled with Norco Anemia: Most likely iron deficiency, checking iron levels along with B12 and folate. Vitamin D deficiency: Rule out persistent deficiency with labs today.  No Follow-up on file.

## 2015-10-26 NOTE — Addendum Note (Signed)
Addended by: Delrae Alfred on: 10/26/2015 09:39 AM   Modules accepted: Orders

## 2015-10-27 LAB — VITAMIN D 25 HYDROXY (VIT D DEFICIENCY, FRACTURES): Vit D, 25-Hydroxy: 23 ng/mL — ABNORMAL LOW (ref 30–100)

## 2015-10-29 ENCOUNTER — Telehealth: Payer: Self-pay

## 2015-10-29 DIAGNOSIS — M79641 Pain in right hand: Secondary | ICD-10-CM

## 2015-10-29 MED ORDER — HYDROCODONE-ACETAMINOPHEN 5-325 MG PO TABS: 1.0000 | ORAL_TABLET | Freq: Three times a day (TID) | ORAL | Status: DC | PRN

## 2015-10-29 NOTE — Telephone Encounter (Signed)
[place], Rx placed in in-box ready for pickup/faxing.  

## 2015-10-29 NOTE — Telephone Encounter (Signed)
I'm not sure when the mix up occurred but it looks like it's been written wrong since the beginning of 2016. I've updated her medication in our system so that future refills show the correct dosing reigmen, up to three times a day only as needed for pain.

## 2015-10-29 NOTE — Telephone Encounter (Signed)
Pt called stating that her hydrocodone Rx was written wrong.  She said that she takes 1 tablet BID sometimes TID but never 1 tablet once daily.  Please advise.

## 2015-10-30 NOTE — Telephone Encounter (Signed)
Pt.notified

## 2015-11-08 ENCOUNTER — Ambulatory Visit (INDEPENDENT_AMBULATORY_CARE_PROVIDER_SITE_OTHER): Payer: BLUE CROSS/BLUE SHIELD | Admitting: Obstetrics & Gynecology

## 2015-11-08 ENCOUNTER — Encounter: Payer: Self-pay | Admitting: Obstetrics & Gynecology

## 2015-11-08 VITALS — BP 116/73 | HR 73 | Wt 201.0 lb

## 2015-11-08 DIAGNOSIS — Z30431 Encounter for routine checking of intrauterine contraceptive device: Secondary | ICD-10-CM

## 2015-11-08 NOTE — Progress Notes (Signed)
   Subjective:    Patient ID: Kimberly Wade, female    DOB: 10-07-1970, 45 y.o.   MRN: [id number]  HPI Patient here for string check of her IUD. States that she has not had any complications with it. She said that she just stopped bleeding a couple days ago as she had been bleeding for several months with her Nexplanon. She has not had any pain with the IUD.     Review of Systems  Constitutional: Negative for fever, chills, activity change and unexpected weight change.  HENT: Negative.   Eyes: Negative.   Respiratory: Negative for shortness of breath.   Cardiovascular: Negative for chest pain.  Genitourinary: Negative for vaginal bleeding, vaginal discharge, difficulty urinating, genital sores, vaginal pain and pelvic pain.  Neurological: Negative.   Psychiatric/Behavioral: Negative.        Objective:   Physical Exam  Constitutional: She is oriented to person, place, and time. She appears well-developed and well-nourished. No distress.  HENT:  Head: Normocephalic.  Eyes: Conjunctivae are normal. Right eye exhibits no discharge. Left eye exhibits no discharge.  Neck: Normal range of motion. Neck supple.  Pulmonary/Chest: Effort normal and breath sounds normal. No respiratory distress.  Genitourinary: Vagina normal and uterus normal. No vaginal discharge found.  Musculoskeletal: Normal range of motion.  Neurological: She is alert and oriented to person, place, and time.  Skin: Skin is warm and dry. She is not diaphoretic.  Psychiatric: She has a normal mood and affect. Her behavior is normal. Judgment and thought content normal.  Vitals reviewed.         Assessment & Plan:  IUD in place.  Well female.  Will follow up with annual exam next year as she just had a PAP smear in 2016. Discussed the importance of getting mammograms.  She has chosen to get mammograms next year which is in line with ACS guidelines (yearly at 78).

## 2015-11-08 NOTE — Progress Notes (Signed)
Patient states she just stopped bleeding on Saturday 11-03-15. Kathrene Alu RN BSN

## 2015-11-20 ENCOUNTER — Encounter: Payer: Self-pay | Admitting: Family Medicine

## 2015-11-20 ENCOUNTER — Ambulatory Visit (INDEPENDENT_AMBULATORY_CARE_PROVIDER_SITE_OTHER): Payer: BLUE CROSS/BLUE SHIELD | Admitting: Family Medicine

## 2015-11-20 VITALS — BP 102/69 | HR 87 | Wt 204.0 lb

## 2015-11-20 DIAGNOSIS — R102 Pelvic and perineal pain: Secondary | ICD-10-CM

## 2015-11-20 DIAGNOSIS — N83202 Unspecified ovarian cyst, left side: Secondary | ICD-10-CM

## 2015-11-20 LAB — WET PREP, GENITAL
Clue Cells Wet Prep HPF POC: NONE SEEN
Trich, Wet Prep: NONE SEEN
WBC WET PREP: NONE SEEN
YEAST WET PREP: NONE SEEN

## 2015-11-20 MED ORDER — OXYCODONE-ACETAMINOPHEN 10-325 MG PO TABS: 1.0000 | ORAL_TABLET | Freq: Three times a day (TID) | ORAL | Status: DC | PRN

## 2015-11-20 MED ORDER — HYDROCHLOROTHIAZIDE 25 MG PO TABS: 25.0000 mg | ORAL_TABLET | Freq: Every day | ORAL | Status: DC

## 2015-11-20 NOTE — Progress Notes (Signed)
CC: Kimberly Wade is a 45 y.o. female is here for Hospitalization Follow-up   Subjective: HPI:  Yesterday she began feeling nauseous and had left lower quadrant pain further localized in the pelvis. It was getting worse throughout the day so she went to a local emergency room. She was found to have a nonobstructive left-sided kidney stone and also a 5 cm cyst on her left ovary. Urine was normal without blood. Lab work was unremarkable. She was sent home with promethazine. She denies any fevers, chills nor vomiting. She denies diarrhea or constipation. She had some brown discharge from her vagina 2 days ago but no other recent genitourinary complaints. She denies any back pain or radiation of her pain. It's worse with pressing in the left lower quadrant.   Review Of Systems Outlined In HPI  Past Medical History  Diagnosis Date  . Anxiety   . GERD (gastroesophageal reflux disease)   . Insomnia   . Pneumonia   . Hypertension   . Hyperlipidemia   . Pneumococcal pneumonia ([place]) 2000  . Diabetes ([place])     States after gastric Bypass Not a diabetic now    Past Surgical History  Procedure Laterality Date  . Cesarean section      x4  . Chest tube insertion    . Gastric bypass    . Dysplastic mole removed      right ankle  . Chest tubes  2000  . Recoonstruction of right lung    . Mini gastric bypass  2011  . Video bronchoscopy Bilateral 04/20/2013    Procedure: VIDEO BRONCHOSCOPY WITH FLUORO;  Surgeon: Elsie Stain, MD;  Location: [facility];  Service: Cardiopulmonary;  Laterality: Bilateral;  . Iud removal N/A 07/11/2014    Procedure: INTRAUTERINE DEVICE (IUD) REMOVAL;  Surgeon: Emily Filbert, MD;  Location: [facility];  Service: Gynecology;  Laterality: N/A;  . Intrauterine device (iud) insertion N/A 07/11/2014    Procedure: INTRAUTERINE DEVICE (IUD) INSERTION;  Surgeon: Emily Filbert, MD;  Location: [facility];  Service: Gynecology;  Laterality: N/A;   Family History  Problem Relation  Age of Onset  . Colon cancer      grandmother  . Heart attack      grandfather  . Diabetes Father     father  . Hypertension Father   . Hyperlipidemia    . Hypertension Mother   . Asthma Daughter     Social History   Social History  . Marital Status: Legally Separated    Spouse Name: N/A  . Number of Children: N/A  . Years of Education: N/A   Occupational History  . Advertising Accountant    Social History Main Topics  . Smoking status: Never Smoker   . Smokeless tobacco: Never Used  . Alcohol Use: Yes     Comment: 1 glass per month  . Drug Use: No  . Sexual Activity:    Partners: Male    Birth Control/ Protection: Implant   Other Topics Concern  . Not on file   Social History Narrative     Objective: BP 102/69 mmHg  Pulse 87  Wt 204 lb (92.534 kg)  Vital signs reviewed. General: Alert and Oriented, No Acute Distress HEENT: Pupils equal, round, reactive to light. Conjunctivae clear.  External ears unremarkable.  Moist mucous membranes. Lungs: Clear and comfortable work of breathing, speaking in full sentences without accessory muscle use. Cardiac: Regular rate and rhythm.  Neuro: CN II-XII grossly intact, gait normal. Abdomen: Mild  left lower quadrant tenderness but no rebound or guarding. Extremities: No peripheral edema.  Strong peripheral pulses.  Mental Status: No depression, anxiety, nor agitation. Logical though process. Skin: Warm and dry.  Assessment & Plan: Kimberly Wade was seen today for hospitalization follow-up.  Diagnoses and all orders for this visit:  Pelvic pain in female -     Cancel: Wet prep, genital -     Wet prep, genital  Left ovarian cyst  Other orders -     oxyCODONE-acetaminophen (PERCOCET) 10-325 MG tablet; Take 1 tablet by mouth every 8 (eight) hours as needed for pain. -     hydrochlorothiazide (HYDRODIURIL) 25 MG tablet; Take 1-2 tablets (25-50 mg total) by mouth daily.   Pelvic pain with recent discharge warrants wet  prep today. Start Percocet as needed for pain that is likely due to an ovarian cyst. I've asked her to call me if it's getting worse throughout the week and the next step would be a pelvic ultrasound. Needs refills on hydrochlorothiazide 25 minutes spent face-to-face during visit today of which at least 50% was counseling or coordinating care regarding: 1. Pelvic pain in female   2. Left ovarian cyst      Return if symptoms worsen or fail to improve. A

## 2015-12-21 ENCOUNTER — Ambulatory Visit (INDEPENDENT_AMBULATORY_CARE_PROVIDER_SITE_OTHER): Payer: BLUE CROSS/BLUE SHIELD | Admitting: Family Medicine

## 2015-12-21 ENCOUNTER — Encounter: Payer: Self-pay | Admitting: Family Medicine

## 2015-12-21 VITALS — BP 101/71 | HR 71 | Wt 198.0 lb

## 2015-12-21 DIAGNOSIS — D649 Anemia, unspecified: Secondary | ICD-10-CM | POA: Diagnosis not present

## 2015-12-21 DIAGNOSIS — E559 Vitamin D deficiency, unspecified: Secondary | ICD-10-CM | POA: Diagnosis not present

## 2015-12-21 DIAGNOSIS — R300 Dysuria: Secondary | ICD-10-CM | POA: Diagnosis not present

## 2015-12-21 DIAGNOSIS — M79641 Pain in right hand: Secondary | ICD-10-CM

## 2015-12-21 LAB — POCT URINALYSIS DIPSTICK
Bilirubin, UA: NEGATIVE
GLUCOSE UA: NEGATIVE
KETONES UA: NEGATIVE
Nitrite, UA: NEGATIVE
Protein, UA: NEGATIVE
SPEC GRAV UA: 1.01
UROBILINOGEN UA: 0.2
pH, UA: 6

## 2015-12-21 MED ORDER — FERROUS SULFATE 325 (65 FE) MG PO TABS: 325.0000 mg | ORAL_TABLET | Freq: Two times a day (BID) | ORAL | Status: AC

## 2015-12-21 MED ORDER — SULFAMETHOXAZOLE-TRIMETHOPRIM 800-160 MG PO TABS
1.0000 | ORAL_TABLET | Freq: Two times a day (BID) | ORAL | Status: DC
Start: 2015-12-21 — End: 2016-04-04

## 2015-12-21 MED ORDER — HYDROCODONE-ACETAMINOPHEN 5-325 MG PO TABS: 1.0000 | ORAL_TABLET | Freq: Three times a day (TID) | ORAL | Status: DC | PRN

## 2015-12-21 MED ORDER — TAMSULOSIN HCL 0.4 MG PO CAPS: 0.4000 mg | ORAL_CAPSULE | Freq: Every day | ORAL | Status: DC

## 2015-12-21 MED ORDER — VITAMIN D (ERGOCALCIFEROL) 1.25 MG (50000 UNIT) PO CAPS: ORAL_CAPSULE | ORAL | Status: DC

## 2015-12-21 NOTE — Progress Notes (Signed)
CC: Kimberly Wade is a 45 y.o. female is here for Dysuria and Nausea   Subjective: HPI:  Upon voiding this morning she had a sharp pain in the vaginal area that is only present while urinating. She denies any skin changes in this region. She doesn't feel like prior episodes and tells me it feels like prior episodes of having a kidney stone. She had a CT scan done last month that did not show any signs of kidney stones in the right renal system or right urinary tract. She had some nausea earlier this morning that occurred when the pain was present. She also feels like she's been having to urinate more frequently. She denies any fevers, chills or flank pain. She denies any gross blood in her urine or vaginal discharge.  She is requesting a refill on hydrocodone for chronic right hand pain. She denies any swelling redness warmth or any motor or sensory disturbances in the hand.  She is requesting a refill on vitamin D. She's been taking this on a weekly basis and if she stops this she dips down into the deficient range ever since having bariatric surgery.  She would also like a refill of iron. She tells me she her she gets it prescribed rather than over-the-counter.  Review Of Systems Outlined In HPI  Past Medical History  Diagnosis Date  . Anxiety   . GERD (gastroesophageal reflux disease)   . Insomnia   . Pneumonia   . Hypertension   . Hyperlipidemia   . Pneumococcal pneumonia ([place]) 2000  . Diabetes ([place])     States after gastric Bypass Not a diabetic now    Past Surgical History  Procedure Laterality Date  . Cesarean section      x4  . Chest tube insertion    . Gastric bypass    . Dysplastic mole removed      right ankle  . Chest tubes  2000  . Recoonstruction of right lung    . Mini gastric bypass  2011  . Video bronchoscopy Bilateral 04/20/2013    Procedure: VIDEO BRONCHOSCOPY WITH FLUORO;  Surgeon: Elsie Stain, MD;  Location: [facility];  Service: Cardiopulmonary;   Laterality: Bilateral;  . Iud removal N/A 07/11/2014    Procedure: INTRAUTERINE DEVICE (IUD) REMOVAL;  Surgeon: Emily Filbert, MD;  Location: [facility];  Service: Gynecology;  Laterality: N/A;  . Intrauterine device (iud) insertion N/A 07/11/2014    Procedure: INTRAUTERINE DEVICE (IUD) INSERTION;  Surgeon: Emily Filbert, MD;  Location: [facility];  Service: Gynecology;  Laterality: N/A;   Family History  Problem Relation Age of Onset  . Colon cancer      grandmother  . Heart attack      grandfather  . Diabetes Father     father  . Hypertension Father   . Hyperlipidemia    . Hypertension Mother   . Asthma Daughter     Social History   Social History  . Marital Status: Legally Separated    Spouse Name: N/A  . Number of Children: N/A  . Years of Education: N/A   Occupational History  . Advertising Accountant    Social History Main Topics  . Smoking status: Never Smoker   . Smokeless tobacco: Never Used  . Alcohol Use: Yes     Comment: 1 glass per month  . Drug Use: No  . Sexual Activity:    Partners: Male    Birth Control/ Protection: Implant   Other Topics Concern  .  Not on file   Social History Narrative     Objective: BP 101/71 mmHg  Pulse 71  Wt 198 lb (89.812 kg)  Vital signs reviewed. General: Alert and Oriented, No Acute Distress HEENT: Pupils equal, round, reactive to light. Conjunctivae clear.  External ears unremarkable.  Moist mucous membranes. Lungs: Clear and comfortable work of breathing, speaking in full sentences without accessory muscle use. Cardiac: Regular rate and rhythm.  Neuro: CN II-XII grossly intact, gait normal. Extremities: No peripheral edema.  Strong peripheral pulses.  Mental Status: No depression, anxiety, nor agitation. Logical though process. Skin: Warm and dry.  Assessment & Plan: Kimberly Wade was seen today for dysuria and nausea.  Diagnoses and all orders for this visit:  Dysuria -     POCT Urinalysis Dipstick -     Urine culture;  Standing -     Urine culture -     sulfamethoxazole-trimethoprim (BACTRIM DS,SEPTRA DS) 800-160 MG tablet; Take 1 tablet by mouth 2 (two) times daily. -     tamsulosin (FLOMAX) 0.4 MG CAPS capsule; Take 1 capsule (0.4 mg total) by mouth daily. May stop whenever pelvic pain resolves.  Right hand pain -     HYDROcodone-acetaminophen (NORCO/VICODIN) 5-325 MG tablet; Take 1 tablet by mouth 3 (three) times daily as needed for moderate pain or severe pain.  Vitamin D deficiency  Anemia, unspecified anemia type  Other orders -     ferrous sulfate 325 (65 FE) MG tablet; Take 1 tablet (325 mg total) by mouth 2 (two) times daily with a meal. -     Vitamin D, Ergocalciferol, (DRISDOL) 50000 units CAPS capsule; TAKE 1 CAPSULE BY MOUTH EVERY 7 DAYS.   Low suspicion of kidney stone however risks of taking Flomax extremely low compared to possible benefit, she is convinced that it's probably a kidney stone. I'm not so much. Start Bactrim for suspicion of UTI, will follow culture Right hand pain: Controlled with low dose of Norco. Vitamin D deficiency: Controlled with weekly supplement Iron Deficiency anemia: Stable on iron sulfate   Return if symptoms worsen or fail to improve.

## 2015-12-23 LAB — URINE CULTURE
Colony Count: NO GROWTH
ORGANISM ID, BACTERIA: NO GROWTH

## 2016-01-31 ENCOUNTER — Other Ambulatory Visit: Payer: Self-pay

## 2016-01-31 DIAGNOSIS — M79641 Pain in right hand: Secondary | ICD-10-CM

## 2016-01-31 MED ORDER — HYDROCODONE-ACETAMINOPHEN 5-325 MG PO TABS: 1.0000 | ORAL_TABLET | Freq: Three times a day (TID) | ORAL | Status: DC | PRN

## 2016-03-03 ENCOUNTER — Other Ambulatory Visit: Payer: Self-pay

## 2016-03-03 DIAGNOSIS — M79641 Pain in right hand: Secondary | ICD-10-CM

## 2016-03-03 MED ORDER — HYDROCODONE-ACETAMINOPHEN 5-325 MG PO TABS: 1.0000 | ORAL_TABLET | Freq: Three times a day (TID) | ORAL | Status: DC | PRN

## 2016-04-04 ENCOUNTER — Encounter: Payer: Self-pay | Admitting: Family Medicine

## 2016-04-04 ENCOUNTER — Telehealth: Payer: Self-pay

## 2016-04-04 ENCOUNTER — Ambulatory Visit (INDEPENDENT_AMBULATORY_CARE_PROVIDER_SITE_OTHER): Payer: BLUE CROSS/BLUE SHIELD | Admitting: Family Medicine

## 2016-04-04 VITALS — BP 109/71 | HR 67 | Wt 203.0 lb

## 2016-04-04 DIAGNOSIS — F411 Generalized anxiety disorder: Secondary | ICD-10-CM | POA: Diagnosis not present

## 2016-04-04 DIAGNOSIS — Z23 Encounter for immunization: Secondary | ICD-10-CM

## 2016-04-04 DIAGNOSIS — Z0000 Encounter for general adult medical examination without abnormal findings: Secondary | ICD-10-CM

## 2016-04-04 DIAGNOSIS — M79641 Pain in right hand: Secondary | ICD-10-CM

## 2016-04-04 DIAGNOSIS — Z1239 Encounter for other screening for malignant neoplasm of breast: Secondary | ICD-10-CM

## 2016-04-04 DIAGNOSIS — R110 Nausea: Secondary | ICD-10-CM | POA: Diagnosis not present

## 2016-04-04 LAB — COMPLETE METABOLIC PANEL WITH GFR
ALBUMIN: 3.5 g/dL — AB (ref 3.6–5.1)
ALK PHOS: 55 U/L (ref 33–115)
ALT: 16 U/L (ref 6–29)
AST: 16 U/L (ref 10–35)
BILIRUBIN TOTAL: 0.6 mg/dL (ref 0.2–1.2)
BUN: 9 mg/dL (ref 7–25)
CALCIUM: 8.6 mg/dL (ref 8.6–10.2)
CHLORIDE: 108 mmol/L (ref 98–110)
CO2: 25 mmol/L (ref 20–31)
CREATININE: 0.6 mg/dL (ref 0.50–1.10)
GFR, Est African American: >89 mL/min (ref >=60)
GFR, Est Non African American: >89 mL/min (ref >=60)
Glucose, Bld: 87 mg/dL (ref 65–99)
Potassium: 3.6 mmol/L (ref 3.5–5.3)
Sodium: 141 mmol/L (ref 135–146)
TOTAL PROTEIN: 6 g/dL — AB (ref 6.1–8.1)

## 2016-04-04 LAB — CBC
HEMATOCRIT: 34.3 % — AB (ref 35.0–45.0)
HEMOGLOBIN: 11.5 g/dL — AB (ref 11.7–15.5)
MCH: 32.3 pg (ref 27.0–33.0)
MCHC: 33.5 g/dL (ref 32.0–36.0)
MCV: 96.3 fL (ref 80.0–100.0)
MPV: 10.4 fL (ref 7.5–12.5)
Platelets: 198 10*3/uL (ref 140–400)
RBC: 3.56 MIL/uL — AB (ref 3.80–5.10)
RDW: 13.5 % (ref 11.0–15.0)
WBC: 6.7 10*3/uL (ref 3.8–10.8)

## 2016-04-04 LAB — LIPID PANEL
Cholesterol: 153 mg/dL (ref 125–200)
HDL: 49 mg/dL (ref >=46)
LDL CALC: 88 mg/dL (ref <130)
TRIGLYCERIDES: 80 mg/dL (ref <150)
Total CHOL/HDL Ratio: 3.1 Ratio (ref <=5.0)
VLDL: 16 mg/dL (ref <30)

## 2016-04-04 MED ORDER — ALPRAZOLAM 1 MG PO TABS: 1.0000 mg | ORAL_TABLET | Freq: Two times a day (BID) | ORAL | 0 refills | Status: DC | PRN

## 2016-04-04 MED ORDER — PHENTERMINE HCL 37.5 MG PO TABS: 37.5000 mg | ORAL_TABLET | Freq: Every day | ORAL | 0 refills | Status: DC

## 2016-04-04 MED ORDER — VITAMIN D (ERGOCALCIFEROL) 1.25 MG (50000 UNIT) PO CAPS: ORAL_CAPSULE | ORAL | 3 refills | Status: DC

## 2016-04-04 MED ORDER — VENLAFAXINE HCL ER 75 MG PO CP24: 75.0000 mg | ORAL_CAPSULE | Freq: Every day | ORAL | 2 refills | Status: DC

## 2016-04-04 MED ORDER — PROMETHAZINE HCL 25 MG PO TABS: 25.0000 mg | ORAL_TABLET | Freq: Three times a day (TID) | ORAL | 0 refills | Status: DC | PRN

## 2016-04-04 MED ORDER — LISINOPRIL-HYDROCHLOROTHIAZIDE 20-12.5 MG PO TABS
1.0000 | ORAL_TABLET | Freq: Every day | ORAL | 3 refills | Status: DC
Start: 2016-04-04 — End: 2017-01-30

## 2016-04-04 MED ORDER — HYDROCHLOROTHIAZIDE 25 MG PO TABS: 25.0000 mg | ORAL_TABLET | Freq: Every day | ORAL | 1 refills | Status: DC

## 2016-04-04 MED ORDER — TRAZODONE HCL 100 MG PO TABS: ORAL_TABLET | ORAL | 2 refills | Status: DC

## 2016-04-04 MED ORDER — HYDROCODONE-ACETAMINOPHEN 5-325 MG PO TABS: 1.0000 | ORAL_TABLET | Freq: Three times a day (TID) | ORAL | 0 refills | Status: DC | PRN

## 2016-04-04 NOTE — Telephone Encounter (Signed)
Yes take both.

## 2016-04-04 NOTE — Progress Notes (Signed)
CC: Kimberly Wade is a 45 y.o. female is here for Annual Exam (pt is fasting )   Subjective: HPI:  Colonoscopy: No current indication Papsmear: UTD from 09/2014 Mammogram: Due, ordered today  Influenza Vaccine: Received today Pneumovax: Up-to-date Td/Tdap: Declined today Zoster: (Start 45 yo)  Requesting complete physical exam with the following complaints She feels like her depression is slowly coming back. She's lost interest in all pleasurable activities. She denies any thoughts of wanting to harm self or others. She used to get benefit from Effexor  She would like refills on her nausea medicine, anxiety medication, vitamin D and pain medication prior to my departure  She is also having difficulty with losing weight despite counting calories and reducing calories and trying to stay active. She wants to know if there is any medication that will help with weight loss.  Review of Systems - General ROS: negative for - chills, fever, night sweats, weight gain or weight loss Ophthalmic ROS: negative for - decreased vision Psychological ROS: negative for - anxiety ENT ROS: negative for - hearing change, nasal congestion, tinnitus or allergies Hematological and Lymphatic ROS: negative for - bleeding problems, bruising or swollen lymph nodes Breast ROS: negative Respiratory ROS: no cough, shortness of breath, or wheezing Cardiovascular ROS: no chest pain or dyspnea on exertion Gastrointestinal ROS: no abdominal pain, change in bowel habits, or black or bloody stools Genito-Urinary ROS: negative for - genital discharge, genital ulcers, incontinence or abnormal bleeding from genitals Musculoskeletal ROS: negative for - joint pain or muscle pain other than chronic hand pain Neurological ROS: negative for - headaches or memory loss Dermatological ROS: negative for lumps, mole changes, rash and skin lesion changes  Past Medical History:  Diagnosis Date  . Anxiety   . Diabetes ([place])     States after gastric Bypass Not a diabetic now  . GERD (gastroesophageal reflux disease)   . Hyperlipidemia   . Hypertension   . Insomnia   . Pneumococcal pneumonia ([place]) 2000  . Pneumonia     Past Surgical History:  Procedure Laterality Date  . CESAREAN SECTION     x4  . CHEST TUBE INSERTION    . chest tubes  2000  . dysplastic mole removed     right ankle  . GASTRIC BYPASS    . INTRAUTERINE DEVICE (IUD) INSERTION N/A 07/11/2014   Procedure: INTRAUTERINE DEVICE (IUD) INSERTION;  Surgeon: Emily Filbert, MD;  Location: [facility];  Service: Gynecology;  Laterality: N/A;  . IUD REMOVAL N/A 07/11/2014   Procedure: INTRAUTERINE DEVICE (IUD) REMOVAL;  Surgeon: Emily Filbert, MD;  Location: [facility];  Service: Gynecology;  Laterality: N/A;  . mini gastric bypass  2011  . recoonstruction of right lung    . VIDEO BRONCHOSCOPY Bilateral 04/20/2013   Procedure: VIDEO BRONCHOSCOPY WITH FLUORO;  Surgeon: Elsie Stain, MD;  [facility];  Service: Cardiopulmonary;  Laterality: Bilateral;   Family History  Problem Relation Age of Onset  . Colon cancer      grandmother  . Heart attack      grandfather  . Diabetes Father     father  . Hypertension Father   . Hyperlipidemia    . Hypertension Mother   . Asthma Daughter     Social History   Social History  . Marital status: Legally Separated    Spouse name: N/A  . Number of children: N/A  . Years of education: N/A   Occupational History  . Advertising Accountant [employer]   Social History Main Topics  . Smoking status: Never Smoker  . Smokeless tobacco: Never Used  . Alcohol use Yes     Comment: 1 glass per month  . Drug use: No  . Sexual activity: Yes    Partners: Male    Birth control/ protection: Implant   Other Topics Concern  . Not on file   Social History Narrative  . No narrative on file     Objective: BP 109/71   Pulse 67   Wt 203 lb (92.1 kg)   BMI 32.77 kg/m   General: No Acute  Distress HEENT: Atraumatic, normocephalic, conjunctivae normal without scleral icterus.  No nasal discharge, hearing grossly intact, TMs with good landmarks bilaterally with no middle ear abnormalities, posterior pharynx clear without oral lesions. Neck: Supple, trachea midline, no cervical nor supraclavicular adenopathy. Pulmonary: Clear to auscultation bilaterally without wheezing, rhonchi, nor rales. Cardiac: Regular rate and rhythm.  No murmurs, rubs, nor gallops. No peripheral edema.  2+ peripheral pulses bilaterally. Abdomen: Bowel sounds normal.  No masses.  Non-tender without rebound.  Negative Murphy's sign. MSK: Grossly intact, no signs of weakness.  Full strength throughout upper and lower extremities.  Full ROM in upper and lower extremities.  No midline spinal tenderness. Neuro: Gait unremarkable, CN II-XII grossly intact.  C5-C6 Reflex 2/4 Bilaterally, L4 Reflex 2/4 Bilaterally.  Cerebellar function intact. Skin: No rashes. Psych: Alert and oriented to person/place/time.  Thought process normal. No anxiety/depression.  Assessment & Plan: Kimberly Wade was seen today for annual exam.  Diagnoses and all orders for this visit:  Screening for malignant neoplasm of breast -     MM DIGITAL SCREENING BILATERAL; Future  Generalized anxiety disorder -     ALPRAZolam (XANAX) 1 MG tablet; Take 1 tablet (1 mg total) by mouth 2 (two) times daily as needed for anxiety or sleep.  Nausea without vomiting -     promethazine (PHENERGAN) 25 MG tablet; Take 1 tablet (25 mg total) by mouth every 8 (eight) hours as needed for nausea or vomiting.  Right hand pain -     HYDROcodone-acetaminophen (NORCO/VICODIN) 5-325 MG tablet; Take 1 tablet by mouth 3 (three) times daily as needed for moderate pain or severe pain.  Annual physical exam -     Lipid panel -     CBC -     COMPLETE METABOLIC PANEL WITH GFR -     Flu Vaccine QUAD 36+ mos IM  Other orders -     Vitamin D, Ergocalciferol, (DRISDOL)  50000 units CAPS capsule; TAKE 1 CAPSULE BY MOUTH EVERY 7 DAYS. -     traZODone (DESYREL) 100 MG tablet; TAKE 1 TO 3 TABLET BY MOUTH AT BEDTIME ONLY IF NEEDED. -     lisinopril-hydrochlorothiazide (ZESTORETIC) 20-12.5 MG tablet; Take 1 tablet by mouth daily. -     hydrochlorothiazide (HYDRODIURIL) 25 MG tablet; Take 1-2 tablets (25-50 mg total) by mouth daily. -     phentermine (ADIPEX-P) 37.5 MG tablet; Take 1 tablet (37.5 mg total) by mouth daily before breakfast. -     venlafaxine XR (EFFEXOR XR) 75 MG 24 hr capsule; Take 1 capsule (75 mg total) by mouth daily with breakfast.   Healthy lifestyle interventions including but not limited to regular exercise, a healthy low fat diet, moderation of salt intake, the dangers of tobacco/alcohol/recreational drug use, nutrition supplementation, and accident avoidance were discussed with the patient and a handout was provided for future reference.  Discussed with this patient  that I will be resigning from my position here with [facility] [employer] in September in order to stay with my family who will be moving to [facility]. I let him know about the providers that are still accepting patients and I feel that this individual will be under great care if he/she stays here with [facility].  Pain med contract scanned  Please call if Effexor does not seem to be helping within 2 weeks.  Return in about 4 weeks (around 05/02/2016) for 1 month nurse visit or [place] [facility] visit for weight check.

## 2016-04-04 NOTE — Telephone Encounter (Signed)
Pharmacy notified.

## 2016-05-02 ENCOUNTER — Ambulatory Visit (INDEPENDENT_AMBULATORY_CARE_PROVIDER_SITE_OTHER): Payer: BLUE CROSS/BLUE SHIELD | Admitting: Physician Assistant

## 2016-05-02 ENCOUNTER — Encounter: Payer: Self-pay | Admitting: Physician Assistant

## 2016-05-02 VITALS — BP 124/76 | HR 98 | Ht 66.0 in | Wt 185.0 lb

## 2016-05-02 DIAGNOSIS — M79641 Pain in right hand: Secondary | ICD-10-CM

## 2016-05-02 DIAGNOSIS — M6530 Trigger finger, unspecified finger: Secondary | ICD-10-CM

## 2016-05-02 DIAGNOSIS — R635 Abnormal weight gain: Secondary | ICD-10-CM | POA: Diagnosis not present

## 2016-05-02 MED ORDER — HYDROCODONE-ACETAMINOPHEN 5-325 MG PO TABS: 1.0000 | ORAL_TABLET | Freq: Three times a day (TID) | ORAL | 0 refills | Status: DC | PRN

## 2016-05-02 MED ORDER — PHENTERMINE HCL 37.5 MG PO TABS: 37.5000 mg | ORAL_TABLET | Freq: Every day | ORAL | 0 refills | Status: DC

## 2016-05-02 NOTE — Progress Notes (Addendum)
Subjective:     Patient ID: Kimberly Wade, female   DOB: August 22, 1970, 45 y.o.   MRN: [id number]  HPI Patient is a 45 y.o. Caucasian female presenting today to establish care and for medication refills. The patient states that she is doing well on her medication regimen at this time with phentermine and hydrocodone-acetaminophen. Patient states that she experiences chronic lower back pain following her gastric bypass and also from trigger finger. Patient states that she currently takes this medication nearly every day for pain with breaks on the weekend. The patient notes that she was 450 lbs prior to having a gastric bypass with a reduced weight of 165 lbs. The patients notes that her current goal weight is 165 and she feels well managed on phentermine. Patient denies palpitations, chest pain, shortness of breath, fever, or dizziness.  Review of Systems  Constitutional: Negative.   HENT: Negative.   Respiratory: Negative for cough, chest tightness, shortness of breath and wheezing.   Cardiovascular: Negative for chest pain, palpitations and leg swelling.  Gastrointestinal: Negative.   Genitourinary: Negative.   Musculoskeletal: Positive for arthralgias and back pain.  Neurological: Negative.   Psychiatric/Behavioral: Negative.        Objective:   Physical Exam  Constitutional: She is oriented to person, place, and time. She appears well-developed and well-nourished. No distress.  HENT:  Head: Normocephalic and atraumatic.  Eyes: Conjunctivae and EOM are normal. Pupils are equal, round, and reactive to light.  Neck: Normal range of motion. Neck supple.  Cardiovascular: Normal rate, regular rhythm, normal heart sounds and intact distal pulses.  Exam reveals no gallop and no friction rub.   No murmur heard. Pulmonary/Chest: Effort normal and breath sounds normal. No respiratory distress. She has no wheezes. She has no rales. She exhibits no tenderness.  Abdominal: Soft. Bowel sounds are  normal.  Neurological: She is alert and oriented to person, place, and time.  Skin: Skin is warm and dry. No rash noted. She is not diaphoretic. No erythema. No pallor.  Psychiatric: She has a normal mood and affect. Her behavior is normal. Judgment and thought content normal.      Assessment:     Kimberly Wade was seen today for abnormal weight gain.  Diagnoses and all orders for this visit:  Abnormal weight gain  Right hand pain -     HYDROcodone-acetaminophen (NORCO/VICODIN) 5-325 MG tablet; Take 1 tablet by mouth 3 (three) times daily as needed for moderate pain or severe pain.  Other orders -     phentermine (ADIPEX-P) 37.5 MG tablet; Take 1 tablet (37.5 mg total) by mouth daily before breakfast.      Plan:     1. Abnormal weight gain - Patient reports doing well on her current medication regimen. Refilled prescription of phentermine. Patient to follow-up in 1 month for weight check.   2. Right hand pain/right trigger finger - Patient to continue on current medication regimen. Discussed with patient the importance of limiting her use of hydrocodone. Extensively educated patient on alternative pain management techniques. Will consider sports medicine for injections if symptoms persist or worsen.

## 2016-05-05 DIAGNOSIS — M6530 Trigger finger, unspecified finger: Secondary | ICD-10-CM | POA: Insufficient documentation

## 2016-05-05 DIAGNOSIS — M79641 Pain in right hand: Secondary | ICD-10-CM | POA: Insufficient documentation

## 2016-05-09 ENCOUNTER — Ambulatory Visit: Payer: BLUE CROSS/BLUE SHIELD | Admitting: Physician Assistant

## 2016-05-16 ENCOUNTER — Emergency Department (INDEPENDENT_AMBULATORY_CARE_PROVIDER_SITE_OTHER)
Admission: EM | Admit: 2016-05-16 | Discharge: 2016-05-16 | Disposition: A | Discharge status: Home / Self Care | Payer: BLUE CROSS/BLUE SHIELD | Source: Home / Self Care | Attending: Family Medicine | Admitting: Family Medicine

## 2016-05-16 DIAGNOSIS — L03113 Cellulitis of right upper limb: Secondary | ICD-10-CM | POA: Diagnosis not present

## 2016-05-16 MED ORDER — MUPIROCIN CALCIUM 2 % EX CREA: 1.0000 "application " | TOPICAL_CREAM | Freq: Three times a day (TID) | CUTANEOUS | 0 refills | Status: DC

## 2016-05-16 MED ORDER — DOXYCYCLINE HYCLATE 100 MG PO CAPS: 100.0000 mg | ORAL_CAPSULE | Freq: Two times a day (BID) | ORAL | 0 refills | Status: DC

## 2016-05-16 NOTE — ED Provider Notes (Signed)
[facility]    CSN: [id number] Arrival date & time: 05/16/16  1920     History   Chief Complaint Chief Complaint  Patient presents with  . Hand Burn    Right    HPI Kimberly Wade is a 45 y.o. female.   While baking brownies about 1.5 weeks ago, patient burned the dorsum of her right hand in her oven.  She has had a persistent wound on her hand.  There has been a slight increase in redness around the wound, and she has developed increasing soreness on the dorsum of her hand, now extending to her wrist.   The history is provided by the patient.  Burn  Burn location:  Hand Hand burn location:  Dorsum of R hand Burn quality:  Red, intact blister and ruptured blister Time since incident:  10 days Progression:  Worsening Mechanism of burn:  Hot surface Incident location:  Home Relieved by:  None tried Worsened by:  Nothing Ineffective treatments: antibiotic ointment. Associated symptoms comment:  None   Past Medical History:  Diagnosis Date  . Anxiety   . Diabetes ([place])    States after gastric Bypass Not a diabetic now  . GERD (gastroesophageal reflux disease)   . Hyperlipidemia   . Hypertension   . Insomnia   . Pneumococcal pneumonia ([place]) 2000  . Pneumonia     Patient Active Problem List   Diagnosis Date Noted  . Right trigger finger 05/05/2016  . Right hand pain 05/05/2016  . Anemia 10/26/2015  . Metrorrhagia 09/18/2015  . Ovarian cyst, right 09/14/2015  . Tension headache 08/08/2014  . Primary osteoarthritis of right hand 02/02/2014  . Aspiration into lower respiratory tract 05/02/2013  . Insomnia 12/24/2012  . Depression 12/24/2012  . Migraine 12/24/2012  . Hyperlipidemia 12/24/2012  . Vitamin D deficiency 12/24/2012  . Essential hypertension, benign 12/24/2012  . Type 2 diabetes mellitus ([place]) 12/24/2012  . Anxiety 12/24/2012  . S/P gastric bypass 12/24/2012  . Increased PTH level 12/24/2012  . GERD (gastroesophageal reflux  disease) 12/24/2012  . Diabetic retinopathy ([place]) 12/24/2012    Past Surgical History:  Procedure Laterality Date  . CESAREAN SECTION     x4  . CHEST TUBE INSERTION    . chest tubes  2000  . dysplastic mole removed     right ankle  . GASTRIC BYPASS    . INTRAUTERINE DEVICE (IUD) INSERTION N/A 07/11/2014   Procedure: INTRAUTERINE DEVICE (IUD) INSERTION;  Surgeon: Emily Filbert, MD;  Location: [facility];  Service: Gynecology;  Laterality: N/A;  . IUD REMOVAL N/A 07/11/2014   Procedure: INTRAUTERINE DEVICE (IUD) REMOVAL;  Surgeon: Emily Filbert, MD;  Location: [facility];  Service: Gynecology;  Laterality: N/A;  . mini gastric bypass  2011  . recoonstruction of right lung    . VIDEO BRONCHOSCOPY Bilateral 04/20/2013   Procedure: VIDEO BRONCHOSCOPY WITH FLUORO;  Surgeon: Elsie Stain, MD;  Location: [facility];  Service: Cardiopulmonary;  Laterality: Bilateral;    OB History    Gravida Para Term Preterm AB Living   5 4   4   3    SAB TAB Ectopic Multiple Live Births                   Home Medications    Prior to Admission medications   Medication Sig Start Date End Date Taking? Authorizing Provider  ALPRAZolam Duanne Moron) 1 MG tablet Take 1 tablet (1 mg total) by mouth 2 (two) times daily  as needed for anxiety or sleep. 04/04/16  Yes Sean Hommel, DO  Biotin 5000 MCG TABS Take 1 tablet by mouth 2 (two) times daily.   Yes Historical Provider, MD  Calcium Citrate-Vitamin D (CALCIUM CITRATE + PO) Take 1 tablet by mouth daily.    Yes Historical Provider, MD  ferrous sulfate 325 (65 FE) MG tablet Take 1 tablet (325 mg total) by mouth 2 (two) times daily with a meal. 12/21/15  Yes Sean Hommel, DO  hydrochlorothiazide (HYDRODIURIL) 25 MG tablet Take 1-2 tablets (25-50 mg total) by mouth daily. 04/04/16  Yes Sean Hommel, DO  HYDROcodone-acetaminophen (NORCO/VICODIN) 5-325 MG tablet Take 1 tablet by mouth 3 (three) times daily as needed for moderate pain or severe pain. 05/02/16  Yes Jade L Breeback, PA-C    levocetirizine (XYZAL) 5 MG tablet Take 1 tablet (5 mg total) by mouth every evening. 10/26/15  Yes Sean Hommel, DO  lisinopril-hydrochlorothiazide (ZESTORETIC) 20-12.5 MG tablet Take 1 tablet by mouth daily. 04/04/16  Yes Marcial Pacas, DO  Multiple Vitamin (MULTIVITAMIN) tablet Take 1 tablet by mouth 3 (three) times daily.   Yes Historical Provider, MD  phentermine (ADIPEX-P) 37.5 MG tablet Take 1 tablet (37.5 mg total) by mouth daily before breakfast. 05/02/16  Yes Jade L Breeback, PA-C  promethazine (PHENERGAN) 25 MG tablet Take 1 tablet (25 mg total) by mouth every 8 (eight) hours as needed for nausea or vomiting. 04/04/16  Yes Sean Hommel, DO  pyridOXINE (VITAMIN B-6) 25 MG tablet Take by mouth.   Yes Historical Provider, MD  SUMAtriptan 6 MG/0.5ML SOAJ INJECT 1 INJECTION UNDER THE SKIN AS NEEDED FOR MIGRAINE MAY REPEAT ONCE IN 2 HOURS IF NOT EFFECTIVE. Due for follow up appointment. 06/01/15  Yes Sean Hommel, DO  traZODone (DESYREL) 100 MG tablet TAKE 1 TO 3 TABLET BY MOUTH AT BEDTIME ONLY IF NEEDED. 04/04/16  Yes Sean Hommel, DO  venlafaxine XR (EFFEXOR XR) 75 MG 24 hr capsule Take 1 capsule (75 mg total) by mouth daily with breakfast. 04/04/16  Yes Sean Hommel, DO  Vitamin D, Ergocalciferol, (DRISDOL) 50000 units CAPS capsule TAKE 1 CAPSULE BY MOUTH EVERY 7 DAYS. 04/04/16  Yes Sean Hommel, DO  doxycycline (VIBRAMYCIN) 100 MG capsule Take 1 capsule (100 mg total) by mouth 2 (two) times daily. Take with food. 05/16/16   Kandra Nicolas, MD  mupirocin cream (BACTROBAN) 2 % Apply 1 application topically 3 (three) times daily. 05/16/16   Kandra Nicolas, MD    Family History Family History  Problem Relation Age of Onset  . Colon cancer      grandmother  . Heart attack      grandfather  . Hyperlipidemia    . Diabetes Father     father  . Hypertension Father   . Hypertension Mother   . Asthma Daughter     Social History Social History  Substance Use Topics  . Smoking status: Never Smoker  .  Smokeless tobacco: Never Used  . Alcohol use Yes     Comment: 1 glass per month     Allergies   Levaquin [levofloxacin in d5w] and Penicillins   Review of Systems Review of Systems  All other systems reviewed and are negative.    Physical Exam Triage Vital Signs ED Triage Vitals  Enc Vitals Group     BP 05/16/16 1945 106/71     Pulse Rate 05/16/16 1945 71     Resp --      Temp 05/16/16 1945 98 F (  36.7 C)     Temp Source 05/16/16 1945 Oral     SpO2 05/16/16 1945 100 %     Weight 05/16/16 1948 179 lb (81.2 kg)     Height 05/16/16 1948 5\' 6"  (1.676 m)     Head Circumference --      Peak Flow --      Pain Score 05/16/16 1949 5     Pain Loc --      Pain Edu? --      Excl. in [place]? --    No data found.   Updated Vital Signs BP 106/71 (BP Location: Left Arm)   Pulse 71   Temp 98 F (36.7 C) (Oral)   Ht 5\' 6"  (1.676 m)   Wt 179 lb (81.2 kg)   SpO2 100%   BMI 28.89 kg/m   Visual Acuity Right Eye Distance:   Left Eye Distance:   Bilateral Distance:    Right Eye Near:   Left Eye Near:    Bilateral Near:     Physical Exam  Constitutional: She appears well-developed and well-nourished. No distress.  HENT:  Head: Normocephalic.  Eyes: Conjunctivae are normal. Pupils are equal, round, and reactive to light.  Cardiovascular: Normal rate.   Pulmonary/Chest: Effort normal.  Musculoskeletal:       Hands: Dorsum of right hand has a 2.5 cm by 1.5cm second degree unhealed burn present as noted on diagram.  There is minimal surrounding erythema, although tenderness is present.  No drainage from wound.  Fingers have full range of motion.  Distal neurovascular function is intact.   Neurological: She is alert.  Skin: Skin is warm and dry.  Nursing note and vitals reviewed.    UC Treatments / Results  Labs (all labs ordered are listed, but only abnormal results are displayed) Labs Reviewed - No data to display  EKG  EKG Interpretation None        Radiology No results found.  Procedures Procedures  Wound dressed with Bacitracin and bandage.  Medications Ordered in UC Medications - No data to display   Initial Impression / Assessment and Plan / UC Course  I have reviewed the triage vital signs and the nursing notes.  Pertinent labs & imaging results that were available during my care of the patient were reviewed by me and considered in my medical decision making (see chart for details).  Clinical Course  Begin Bactroban cream TID, and oral doxycycline 100mg  BID for staph coverage. Change dressing daily and apply Bactroban cream to wound.  Keep wound clean and dry.  Return for any signs of infection (or follow-up with family doctor):  Increasing redness, swelling, pain, heat, drainage, etc.     Final Clinical Impressions(s) / UC Diagnoses   Final diagnoses:  Cellulitis of right hand excluding fingers and thumb    New Prescriptions New Prescriptions   DOXYCYCLINE (VIBRAMYCIN) 100 MG CAPSULE    Take 1 capsule (100 mg total) by mouth 2 (two) times daily. Take with food.   MUPIROCIN CREAM (BACTROBAN) 2 %    Apply 1 application topically 3 (three) times daily.     Kandra Nicolas, MD 05/17/16 [phone]

## 2016-05-16 NOTE — Discharge Instructions (Signed)
Change dressing daily and apply Bactroban cream to wound.  Keep wound clean and dry.  Return for any signs of infection (or follow-up with family doctor):  Increasing redness, swelling, pain, heat, drainage, etc.

## 2016-05-16 NOTE — ED Notes (Signed)
Silvadine and bandaid placed per Dr. Assunta Found.

## 2016-05-16 NOTE — ED Triage Notes (Signed)
Patient states that approx 1-2 weeks ago she was baking brownies and burned the top of her right hand. It has since become red and swollen. Patient states that pain radiates up her arm.

## 2016-05-18 ENCOUNTER — Telehealth: Payer: Self-pay | Admitting: Emergency Medicine

## 2016-05-18 NOTE — Telephone Encounter (Signed)
Inquired about patient's status; encourage them to call with questions/concerns.  

## 2016-06-05 ENCOUNTER — Ambulatory Visit (INDEPENDENT_AMBULATORY_CARE_PROVIDER_SITE_OTHER): Payer: BLUE CROSS/BLUE SHIELD | Admitting: Sports Medicine

## 2016-06-05 VITALS — BP 109/60 | HR 70 | Ht 66.0 in | Wt 181.0 lb

## 2016-06-05 DIAGNOSIS — R635 Abnormal weight gain: Secondary | ICD-10-CM | POA: Diagnosis not present

## 2016-06-05 MED ORDER — PHENTERMINE HCL 37.5 MG PO TABS: 37.5000 mg | ORAL_TABLET | Freq: Every day | ORAL | 0 refills | Status: DC

## 2016-06-05 NOTE — Progress Notes (Signed)
Pt in for weight/bp check.  She has lost 4lbs since last visit.  Phentermine refill sent to pharmacy.

## 2016-06-18 ENCOUNTER — Ambulatory Visit (INDEPENDENT_AMBULATORY_CARE_PROVIDER_SITE_OTHER): Payer: BLUE CROSS/BLUE SHIELD | Admitting: Sports Medicine

## 2016-06-18 ENCOUNTER — Encounter: Payer: Self-pay | Admitting: Sports Medicine

## 2016-06-18 VITALS — BP 100/62 | HR 73 | Resp 18 | Wt 181.0 lb

## 2016-06-18 DIAGNOSIS — R350 Frequency of micturition: Secondary | ICD-10-CM

## 2016-06-18 DIAGNOSIS — N3001 Acute cystitis with hematuria: Secondary | ICD-10-CM | POA: Diagnosis not present

## 2016-06-18 DIAGNOSIS — N3000 Acute cystitis without hematuria: Secondary | ICD-10-CM | POA: Insufficient documentation

## 2016-06-18 LAB — POCT URINALYSIS DIPSTICK
Bilirubin, UA: NEGATIVE
Glucose, UA: NEGATIVE
Ketones, UA: NEGATIVE
Nitrite, UA: NEGATIVE
Spec Grav, UA: 1.02
Urobilinogen, UA: 0.2
pH, UA: 6

## 2016-06-18 MED ORDER — NITROFURANTOIN MONOHYD MACRO 100 MG PO CAPS: 100.0000 mg | ORAL_CAPSULE | Freq: Two times a day (BID) | ORAL | 0 refills | Status: DC

## 2016-06-18 MED ORDER — PHENAZOPYRIDINE HCL 200 MG PO TABS: 200.0000 mg | ORAL_TABLET | Freq: Three times a day (TID) | ORAL | 0 refills | Status: AC

## 2016-06-18 MED ORDER — ONDANSETRON 8 MG PO TBDP: 8.0000 mg | ORAL_TABLET | Freq: Three times a day (TID) | ORAL | 3 refills | Status: DC | PRN

## 2016-06-18 NOTE — Progress Notes (Signed)
  Subjective:    CC: Dysuria  HPI: This is a pleasant 45 year old female, she comes in with a several day history of increasing frequency, urgency, burning with urination. Mild nausea, no constitutional symptoms, no flank pain. Symptoms are moderate, worsening.  Past medical history:  Negative.  See flowsheet/record as well for more information.  Surgical history: Negative.  See flowsheet/record as well for more information.  Family history: Negative.  See flowsheet/record as well for more information.  Social history: Negative.  See flowsheet/record as well for more information.  Allergies, and medications have been entered into the medical record, reviewed, and no changes needed.   Review of Systems: No fevers, chills, night sweats, weight loss, chest pain, or shortness of breath.   Objective:    General: Well Developed, well nourished, and in no acute distress.  Neuro: Alert and oriented x3, extra-ocular muscles intact, sensation grossly intact.  HEENT: Normocephalic, atraumatic, pupils equal round reactive to light, neck supple, no masses, no lymphadenopathy, thyroid nonpalpable.  Skin: Warm and dry, no rashes. Cardiac: Regular rate and rhythm, no murmurs rubs or gallops, no lower extremity edema.  Respiratory: Clear to auscultation bilaterally. Not using accessory muscles, speaking in full sentences. Abdomen: Soft, tender to palpation in the superpubic region, nondistended, normal bowel sounds, no palpable masses, no guarding, rigidity, rebound tenderness, no costovertebral angle pain.  Impression and Recommendations:    Acute cystitis Urinalysis shows leukocytes, urine culture ordered. Adding Macrobid, having some nausea so we will add Zofran. No evidence of pyelonephritis Return as needed.

## 2016-06-18 NOTE — Assessment & Plan Note (Signed)
Urinalysis shows leukocytes, urine culture ordered. Adding Macrobid, having some nausea so we will add Zofran. No evidence of pyelonephritis Return as needed.

## 2016-06-21 LAB — URINE CULTURE

## 2016-06-30 ENCOUNTER — Other Ambulatory Visit: Payer: Self-pay | Admitting: *Deleted

## 2016-06-30 DIAGNOSIS — M79641 Pain in right hand: Secondary | ICD-10-CM

## 2016-06-30 MED ORDER — HYDROCODONE-ACETAMINOPHEN 5-325 MG PO TABS
1.0000 | ORAL_TABLET | Freq: Three times a day (TID) | ORAL | 0 refills | Status: DC | PRN
Start: 2016-06-30 — End: 2016-08-08

## 2016-07-01 ENCOUNTER — Other Ambulatory Visit: Payer: Self-pay

## 2016-07-01 MED ORDER — VENLAFAXINE HCL ER 75 MG PO CP24: 75.0000 mg | ORAL_CAPSULE | Freq: Every day | ORAL | 0 refills | Status: DC

## 2016-07-09 ENCOUNTER — Ambulatory Visit (INDEPENDENT_AMBULATORY_CARE_PROVIDER_SITE_OTHER): Payer: BLUE CROSS/BLUE SHIELD | Admitting: Physician Assistant

## 2016-07-09 VITALS — BP 96/51 | HR 60 | Wt 176.0 lb

## 2016-07-09 DIAGNOSIS — R635 Abnormal weight gain: Secondary | ICD-10-CM | POA: Diagnosis not present

## 2016-07-09 MED ORDER — PHENTERMINE HCL 37.5 MG PO TABS: 37.5000 mg | ORAL_TABLET | Freq: Every day | ORAL | 0 refills | Status: DC

## 2016-07-09 NOTE — Progress Notes (Signed)
Patient is here for blood pressure and weight check. Denies any trouble sleeping, palpitations, or any other medication problems. Patient has lost weight. A refill for Phentermine will be sent to patient preferred pharmacy. Patient advised to schedule a four week appointment with her PCP. Patient also states her daughter was just diagnosed with the flu. Advised Pt to contact our after hours line if she starts to feel symptomatic over the holiday weekend. Verbalized understanding, no further questions.

## 2016-08-06 ENCOUNTER — Ambulatory Visit: Payer: BLUE CROSS/BLUE SHIELD | Admitting: Physician Assistant

## 2016-08-08 ENCOUNTER — Ambulatory Visit (INDEPENDENT_AMBULATORY_CARE_PROVIDER_SITE_OTHER): Payer: BLUE CROSS/BLUE SHIELD | Admitting: Physician Assistant

## 2016-08-08 ENCOUNTER — Encounter: Payer: Self-pay | Admitting: Physician Assistant

## 2016-08-08 VITALS — BP 99/62 | HR 76 | Ht 66.0 in | Wt 177.0 lb

## 2016-08-08 DIAGNOSIS — F411 Generalized anxiety disorder: Secondary | ICD-10-CM | POA: Diagnosis not present

## 2016-08-08 DIAGNOSIS — F330 Major depressive disorder, recurrent, mild: Secondary | ICD-10-CM | POA: Diagnosis not present

## 2016-08-08 DIAGNOSIS — R635 Abnormal weight gain: Secondary | ICD-10-CM

## 2016-08-08 DIAGNOSIS — M79641 Pain in right hand: Secondary | ICD-10-CM

## 2016-08-08 DIAGNOSIS — I10 Essential (primary) hypertension: Secondary | ICD-10-CM | POA: Diagnosis not present

## 2016-08-08 DIAGNOSIS — M19041 Primary osteoarthritis, right hand: Secondary | ICD-10-CM

## 2016-08-08 DIAGNOSIS — M545 Low back pain, unspecified: Secondary | ICD-10-CM

## 2016-08-08 DIAGNOSIS — F419 Anxiety disorder, unspecified: Secondary | ICD-10-CM | POA: Diagnosis not present

## 2016-08-08 MED ORDER — BUSPIRONE HCL 7.5 MG PO TABS: 7.5000 mg | ORAL_TABLET | Freq: Three times a day (TID) | ORAL | 1 refills | Status: DC

## 2016-08-08 MED ORDER — HYDROCODONE-ACETAMINOPHEN 5-325 MG PO TABS
1.0000 | ORAL_TABLET | Freq: Three times a day (TID) | ORAL | 0 refills | Status: DC | PRN
Start: 2016-08-08 — End: 2016-10-13

## 2016-08-08 MED ORDER — ALPRAZOLAM 1 MG PO TABS: 1.0000 mg | ORAL_TABLET | Freq: Two times a day (BID) | ORAL | 2 refills | Status: DC | PRN

## 2016-08-08 MED ORDER — VENLAFAXINE HCL ER 75 MG PO CP24: 75.0000 mg | ORAL_CAPSULE | Freq: Every day | ORAL | 5 refills | Status: DC

## 2016-08-08 MED ORDER — HYDROCHLOROTHIAZIDE 25 MG PO TABS: 25.0000 mg | ORAL_TABLET | Freq: Every day | ORAL | 1 refills | Status: DC

## 2016-08-08 MED ORDER — PHENTERMINE HCL 37.5 MG PO TABS: 37.5000 mg | ORAL_TABLET | Freq: Every day | ORAL | 0 refills | Status: DC

## 2016-08-08 NOTE — Progress Notes (Signed)
Subjective:    Patient ID: Kimberly Wade, female    DOB: [birth year], 45 y.o.   MRN: [id number]  HPI  Pt is a 45 yo female who presents to the clinic for medication refills and follow up.   .. Active Ambulatory Problems    Diagnosis Date Noted  . Insomnia 12/24/2012  . Depression 12/24/2012  . Migraine 12/24/2012  . Hyperlipidemia 12/24/2012  . Vitamin D deficiency 12/24/2012  . Essential hypertension, benign 12/24/2012  . Type 2 diabetes mellitus ([place]) 12/24/2012  . Anxiety 12/24/2012  . S/P gastric bypass 12/24/2012  . Increased PTH level 12/24/2012  . GERD (gastroesophageal reflux disease) 12/24/2012  . Diabetic retinopathy ([place]) 12/24/2012  . Aspiration into lower respiratory tract 05/02/2013  . Primary osteoarthritis of right hand 02/02/2014  . Tension headache 08/08/2014  . Ovarian cyst, right 09/14/2015  . Metrorrhagia 09/18/2015  . Anemia 10/26/2015  . Right trigger finger 05/05/2016  . Right hand pain 05/05/2016  . Acute cystitis 06/18/2016  . Low back pain without sciatica 08/08/2016  . Generalized anxiety disorder 08/09/2016   Resolved Ambulatory Problems    Diagnosis Date Noted  . Lingular pneumonia 12/24/2012  . Sinusitis, chronic 12/24/2012   Past Medical History:  Diagnosis Date  . Anxiety   . Diabetes ([place])   . GERD (gastroesophageal reflux disease)   . Hyperlipidemia   . Hypertension   . Insomnia   . Pneumococcal pneumonia ([place]) 2000  . Pneumonia    .[name] Family History  Problem Relation Age of Onset  . Colon cancer      grandmother  . Heart attack      grandfather  . Hyperlipidemia    . Diabetes Father     father  . Hypertension Father   . Hypertension Mother   . Asthma Daughter    .[name] Social History   Social History  . Marital status: Legally Separated    Spouse name: N/A  . Number of children: N/A  . Years of education: N/A   Occupational History  . 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  Smoking status: Never Smoker  . Smokeless tobacco: Never Used  . Alcohol use Yes     Comment: 1 glass per month  . Drug use: No  . Sexual activity: Yes    Partners: Male    Birth control/ protection: Implant   Other Topics Concern  . Not on file   Social History Narrative  . No narrative on file   She is doing great with weight loss. BMI down to 28lbs. She has gained 1lb in the last month but has also increased her strength training. She denies any side effects to phentermine. She has been on since October.   HTN- denies any CP, palpitations, headaches, SOB.   Anxiety- effexor has helped a lot with depression but she continues to feel anxious. She is using xanax only as needed and not daily. Work seems to be one of the biggest stressors.   Chronic pain- cannot take extensive NSAIDs due to gastric bypass. She takes 1-2 tablets a day as for many years. She has ongoing low back pain, right OA of hands. Denies any radicular symptoms.   Review of Systems  All other systems reviewed and are negative.      Objective:   Physical Exam  Constitutional: She is oriented to person, place, and time. She appears well-developed and well-nourished.  HENT:  Head: Normocephalic and atraumatic.  Cardiovascular: Normal rate,  regular rhythm and normal heart sounds.   Pulmonary/Chest: Effort normal and breath sounds normal.  Neurological: She is alert and oriented to person, place, and time.  Psychiatric: She has a normal mood and affect. Her behavior is normal.          Assessment & Plan:  Marland KitchenMarland KitchenAlissandra was seen today for depression, anxiety and abnormal weight gain.  Diagnoses and all orders for this visit:  Essential hypertension, benign -     hydrochlorothiazide (HYDRODIURIL) 25 MG tablet; Take 1-2 tablets (25-50 mg total) by mouth daily.  Anxiety -     busPIRone (BUSPAR) 7.5 MG tablet; Take 1 tablet (7.5 mg total) by mouth 3 (three) times daily. -     ALPRAZolam (XANAX) 1 MG tablet;  Take 1 tablet (1 mg total) by mouth 2 (two) times daily as needed for anxiety or sleep.  Mild episode of recurrent major depressive disorder (HCC) -     venlafaxine XR (EFFEXOR XR) 75 MG 24 hr capsule; Take 1 capsule (75 mg total) by mouth daily with breakfast.  Generalized anxiety disorder -     venlafaxine XR (EFFEXOR XR) 75 MG 24 hr capsule; Take 1 capsule (75 mg total) by mouth daily with breakfast. -     ALPRAZolam (XANAX) 1 MG tablet; Take 1 tablet (1 mg total) by mouth 2 (two) times daily as needed for anxiety or sleep.  Right hand pain -     HYDROcodone-acetaminophen (NORCO/VICODIN) 5-325 MG tablet; Take 1 tablet by mouth 3 (three) times daily as needed for moderate pain or severe pain.  Primary osteoarthritis of right hand -     HYDROcodone-acetaminophen (NORCO/VICODIN) 5-325 MG tablet; Take 1 tablet by mouth 3 (three) times daily as needed for moderate pain or severe pain.  Acute midline low back pain without sciatica -     HYDROcodone-acetaminophen (NORCO/VICODIN) 5-325 MG tablet; Take 1 tablet by mouth 3 (three) times daily as needed for moderate pain or severe pain.  Abnormal weight gain -     phentermine (ADIPEX-P) 37.5 MG tablet; Take 1 tablet (37.5 mg total) by mouth daily before breakfast.

## 2016-08-09 DIAGNOSIS — F411 Generalized anxiety disorder: Secondary | ICD-10-CM | POA: Insufficient documentation

## 2016-08-20 ENCOUNTER — Encounter: Payer: Self-pay | Admitting: Emergency Medicine

## 2016-08-20 ENCOUNTER — Ambulatory Visit: Payer: BLUE CROSS/BLUE SHIELD | Admitting: Physician Assistant

## 2016-08-20 ENCOUNTER — Emergency Department (INDEPENDENT_AMBULATORY_CARE_PROVIDER_SITE_OTHER)
Admission: EM | Admit: 2016-08-20 | Discharge: 2016-08-20 | Disposition: A | Discharge status: Home / Self Care | Payer: BLUE CROSS/BLUE SHIELD | Source: Home / Self Care | Attending: Family Medicine | Admitting: Family Medicine

## 2016-08-20 DIAGNOSIS — H6692 Otitis media, unspecified, left ear: Secondary | ICD-10-CM | POA: Diagnosis not present

## 2016-08-20 DIAGNOSIS — J069 Acute upper respiratory infection, unspecified: Secondary | ICD-10-CM

## 2016-08-20 MED ORDER — GUAIFENESIN-CODEINE 100-10 MG/5ML PO SYRP: 5.0000 mL | ORAL_SOLUTION | Freq: Three times a day (TID) | ORAL | 0 refills | Status: DC | PRN

## 2016-08-20 MED ORDER — CEFDINIR 300 MG PO CAPS: 300.0000 mg | ORAL_CAPSULE | Freq: Two times a day (BID) | ORAL | 0 refills | Status: DC

## 2016-08-20 NOTE — ED Triage Notes (Signed)
Left earache, runny nose, congestion, headache x 1 week

## 2016-08-20 NOTE — ED Provider Notes (Signed)
CSN: [id number]     Arrival date & time 08/20/16  0856 History   First MD Initiated Contact with Patient 08/20/16 [phone]     Chief Complaint  Patient presents with  . Otalgia   (Consider location/radiation/quality/duration/timing/severity/associated sxs/prior Treatment) HPI Kimberly Wade is a 46 y.o. female presenting to UC with c/o Left ear pain that is aching and sore, 7/10 at worst, gradually worsening for 1 week.  Associated sinus congestion and mild sore throat.  She has taken Mucinex and ibuprofen with mild temporary relief.  Mild intermittent productive cough.  Denies fever, chills, n/v/d.   Past Medical History:  Diagnosis Date  . Anxiety   . Diabetes ([place])    States after gastric Bypass Not a diabetic now  . GERD (gastroesophageal reflux disease)   . Hyperlipidemia   . Hypertension   . Insomnia   . Pneumococcal pneumonia ([place]) 2000  . Pneumonia    Past Surgical History:  Procedure Laterality Date  . CESAREAN SECTION     x4  . CHEST TUBE INSERTION    . chest tubes  2000  . dysplastic mole removed     right ankle  . GASTRIC BYPASS    . INTRAUTERINE DEVICE (IUD) INSERTION N/A 07/11/2014   Procedure: INTRAUTERINE DEVICE (IUD) INSERTION;  Surgeon: Emily Filbert, MD;  Location: [facility];  Service: Gynecology;  Laterality: N/A;  . IUD REMOVAL N/A 07/11/2014   Procedure: INTRAUTERINE DEVICE (IUD) REMOVAL;  Surgeon: Emily Filbert, MD;  Location: [facility];  Service: Gynecology;  Laterality: N/A;  . mini gastric bypass  2011  . recoonstruction of right lung    . VIDEO BRONCHOSCOPY Bilateral 04/20/2013   Procedure: VIDEO BRONCHOSCOPY WITH FLUORO;  Surgeon: Elsie Stain, MD;  Location: [facility];  Service: Cardiopulmonary;  Laterality: Bilateral;   Family History  Problem Relation Age of Onset  . Colon cancer      grandmother  . Heart attack      grandfather  . Hyperlipidemia    . Diabetes Father     father  . Hypertension Father   . Hypertension Mother   . Asthma  Daughter    Social History  Substance Use Topics  . Smoking status: Never Smoker  . Smokeless tobacco: Never Used  . Alcohol use Yes     Comment: 1 glass per month   OB History    Gravida Para Term Preterm AB Living   5 4   4   3    SAB TAB Ectopic Multiple Live Births                 Review of Systems  Constitutional: Negative for chills and fever.  HENT: Positive for congestion, ear pain (Left worse than Right ), postnasal drip, rhinorrhea, sinus pain and sinus pressure. Negative for sore throat, trouble swallowing and voice change.   Respiratory: Positive for cough. Negative for shortness of breath.   Cardiovascular: Negative for chest pain and palpitations.  Gastrointestinal: Negative for abdominal pain, diarrhea, nausea and vomiting.  Musculoskeletal: Negative for arthralgias, back pain and myalgias.  Skin: Negative for rash.  Neurological: Negative for dizziness, light-headedness and headaches.    Allergies  Levaquin [levofloxacin in d5w] and Penicillins  Home Medications   Prior to Admission medications   Medication Sig Start Date End Date Taking? Authorizing Provider  ALPRAZolam Duanne Moron) 1 MG tablet Take 1 tablet (1 mg total) by mouth 2 (two) times daily as needed for anxiety or sleep. 08/08/16   Luvenia Starch  L Breeback, PA-C  Biotin 5000 MCG TABS Take 1 tablet by mouth 2 (two) times daily.    Historical Provider, MD  busPIRone (BUSPAR) 7.5 MG tablet Take 1 tablet (7.5 mg total) by mouth 3 (three) times daily. 08/08/16   Jade L Breeback, PA-C  Calcium Citrate-Vitamin D (CALCIUM CITRATE + PO) Take 1 tablet by mouth daily.     Historical Provider, MD  cefdinir (OMNICEF) 300 MG capsule Take 1 capsule (300 mg total) by mouth 2 (two) times daily. For 10 days 08/20/16   Noland Fordyce, PA-C  ferrous sulfate 325 (65 FE) MG tablet Take 1 tablet (325 mg total) by mouth 2 (two) times daily with a meal. 12/21/15   Sean Hommel, DO  guaiFENesin-codeine (ROBITUSSIN AC) 100-10 MG/5ML syrup Take 5  mLs by mouth 3 (three) times daily as needed for cough. 08/20/16   Noland Fordyce, PA-C  hydrochlorothiazide (HYDRODIURIL) 25 MG tablet Take 1-2 tablets (25-50 mg total) by mouth daily. 08/08/16   Jade L Breeback, PA-C  HYDROcodone-acetaminophen (NORCO/VICODIN) 5-325 MG tablet Take 1 tablet by mouth 3 (three) times daily as needed for moderate pain or severe pain. 08/08/16   Jade L Breeback, PA-C  lisinopril-hydrochlorothiazide (ZESTORETIC) 20-12.5 MG tablet Take 1 tablet by mouth daily. 04/04/16   Marcial Pacas, DO  Multiple Vitamin (MULTIVITAMIN) tablet Take 1 tablet by mouth 3 (three) times daily.    Historical Provider, MD  ondansetron (ZOFRAN-ODT) 8 MG disintegrating tablet Take 1 tablet (8 mg total) by mouth every 8 (eight) hours as needed for nausea. 06/18/16   Silverio Decamp, MD  phentermine (ADIPEX-P) 37.5 MG tablet Take 1 tablet (37.5 mg total) by mouth daily before breakfast. 08/08/16   Donella Stade, PA-C  pyridOXINE (VITAMIN B-6) 25 MG tablet Take by mouth.    Historical Provider, MD  SUMAtriptan 6 MG/0.5ML SOAJ INJECT 1 INJECTION UNDER THE SKIN AS NEEDED FOR MIGRAINE MAY REPEAT ONCE IN 2 HOURS IF NOT EFFECTIVE. Due for follow up appointment. 06/01/15   Sean Hommel, DO  traZODone (DESYREL) 100 MG tablet TAKE 1 TO 3 TABLET BY MOUTH AT BEDTIME ONLY IF NEEDED. 04/04/16   Marcial Pacas, DO  venlafaxine XR (EFFEXOR XR) 75 MG 24 hr capsule Take 1 capsule (75 mg total) by mouth daily with breakfast. 08/08/16   Jade L Breeback, PA-C  Vitamin D, Ergocalciferol, (DRISDOL) 50000 units CAPS capsule TAKE 1 CAPSULE BY MOUTH EVERY 7 DAYS. 04/04/16   Marcial Pacas, DO   Meds Ordered and Administered this Visit  Medications - No data to display  BP 195/69 (BP Location: Left Arm)   Pulse 71   Temp 98.1 F (36.7 C) (Oral)   Ht 5\' 5"  (1.651 m)   Wt 171 lb (77.6 kg)   SpO2 97%   BMI 28.46 kg/m  No data found.   Physical Exam  Constitutional: She is oriented to person, place, and time. She appears  well-developed and well-nourished. No distress.  HENT:  Head: Normocephalic and atraumatic.  Right Ear: Tympanic membrane is erythematous. Tympanic membrane is not bulging. No middle ear effusion.  Left Ear: Tympanic membrane is erythematous. Tympanic membrane is not bulging. A middle ear effusion is present.  Nose: Nose normal.  Mouth/Throat: Uvula is midline, oropharynx is clear and moist and mucous membranes are normal.  Eyes: EOM are normal.  Neck: Normal range of motion. Neck supple.  Cardiovascular: Normal rate and regular rhythm.   Pulmonary/Chest: Effort normal and breath sounds normal. No respiratory distress. She has no  wheezes. She has no rales.  Musculoskeletal: Normal range of motion.  Neurological: She is alert and oriented to person, place, and time.  Skin: Skin is warm and dry. She is not diaphoretic.  Psychiatric: She has a normal mood and affect. Her behavior is normal.  Nursing note and vitals reviewed.   Urgent Care Course   Clinical Course     Procedures (including critical care time)  Labs Review Labs Reviewed - No data to display  Imaging Review No results found.   MDM   1. Acute left otitis media   2. Upper respiratory tract infection, unspecified type    Pt c/o Left ear pain and URI symptoms.  Exam c/w Left AOM.  Rx: Omnicef, pt requested cough medication with codeine (has had before w/o side effects, tessalon makes her fatigued): guaifenesin-codeine  F/u with PCP in 1 week if not improving. Patient verbalized understanding and agreement with treatment plan.     Noland Fordyce, PA-C 08/20/16 1051

## 2016-08-20 NOTE — Discharge Instructions (Signed)
°  Guaifenesin-codeine is a strong cough medication.  It is a narcotic.  It can cause drowsiness. Do not drive, drink alcohol or operate heavy machinery while taking.    Please take antibiotics as prescribed and be sure to complete entire course even if you start to feel better to ensure infection does not come back.

## 2016-08-29 ENCOUNTER — Encounter: Payer: Self-pay | Admitting: Sports Medicine

## 2016-08-29 ENCOUNTER — Ambulatory Visit (INDEPENDENT_AMBULATORY_CARE_PROVIDER_SITE_OTHER): Payer: BLUE CROSS/BLUE SHIELD | Admitting: Sports Medicine

## 2016-08-29 DIAGNOSIS — R69 Illness, unspecified: Secondary | ICD-10-CM

## 2016-08-29 DIAGNOSIS — J111 Influenza due to unidentified influenza virus with other respiratory manifestations: Secondary | ICD-10-CM | POA: Insufficient documentation

## 2016-08-29 LAB — POCT INFLUENZA A/B
Influenza A, POC: POSITIVE — AB
Influenza B, POC: NEGATIVE

## 2016-08-29 MED ORDER — KETOROLAC TROMETHAMINE 30 MG/ML IJ SOLN
30.0000 mg | Freq: Once | INTRAMUSCULAR | Status: AC
  Administered 2016-08-29: 30 mg via INTRAMUSCULAR

## 2016-08-29 MED ORDER — THERAFLU SEVERE COLD/CGH DAY 20-10-650 MG PO PACK: PACK | ORAL | 0 refills | Status: DC

## 2016-08-29 MED ORDER — OSELTAMIVIR PHOSPHATE 75 MG PO CAPS: 75.0000 mg | ORAL_CAPSULE | Freq: Two times a day (BID) | ORAL | 0 refills | Status: DC

## 2016-08-29 NOTE — Assessment & Plan Note (Signed)
Symptoms consistent with influenza, shot of Toradol, patient will use TheraFlu over-the-counter, adding Tamiflu. Return if no better in a week.

## 2016-08-29 NOTE — Progress Notes (Signed)
  Subjective:    CC: Feeling sick  HPI: For the past 2 days this pleasant 46 year old female has had severe onset of myalgias, cough, sore throat, fatigue, headaches. Low-grade fever, and she did just take a Norco. No shortness of breath, cough is nonproductive. She did have her flu shot this year. She has a bit of nausea but tells me her symptoms are not like when she typically gets a migraine. No neck stiffness.  Past medical history:  Negative.  See flowsheet/record as well for more information.  Surgical history: Negative.  See flowsheet/record as well for more information.  Family history: Negative.  See flowsheet/record as well for more information.  Social history: Negative.  See flowsheet/record as well for more information.  Allergies, and medications have been entered into the medical record, reviewed, and no changes needed.   Review of Systems: No fevers, chills, night sweats, weight loss, chest pain, or shortness of breath.   Objective:    General: Well Developed, well nourished, and in no acute distress.  Neuro: Alert and oriented x3, extra-ocular muscles intact, sensation grossly intact.  HEENT: Normocephalic, atraumatic, pupils equal round reactive to light, neck supple, no masses, no lymphadenopathy, thyroid nonpalpable. Oropharynx, nasopharynx, ear canals unremarkable, neck is tender to palpation but no masses. Skin: Warm and dry, no rashes. Cardiac: Regular rate and rhythm, no murmurs rubs or gallops, no lower extremity edema.  Respiratory: Clear to auscultation bilaterally. Not using accessory muscles, speaking in full sentences.  Influenza swab positive.  Impression and Recommendations:    Influenza-like illness Symptoms consistent with influenza, shot of Toradol, patient will use TheraFlu over-the-counter, adding Tamiflu. Return if no better in a week.

## 2016-10-03 ENCOUNTER — Other Ambulatory Visit: Payer: Self-pay | Admitting: Physician Assistant

## 2016-10-03 DIAGNOSIS — F419 Anxiety disorder, unspecified: Secondary | ICD-10-CM

## 2016-10-13 ENCOUNTER — Other Ambulatory Visit: Payer: Self-pay | Admitting: *Deleted

## 2016-10-13 DIAGNOSIS — M545 Low back pain, unspecified: Secondary | ICD-10-CM

## 2016-10-13 DIAGNOSIS — M19041 Primary osteoarthritis, right hand: Secondary | ICD-10-CM

## 2016-10-13 DIAGNOSIS — M79641 Pain in right hand: Secondary | ICD-10-CM

## 2016-10-13 MED ORDER — HYDROCODONE-ACETAMINOPHEN 5-325 MG PO TABS: 1.0000 | ORAL_TABLET | Freq: Three times a day (TID) | ORAL | 0 refills | Status: DC | PRN

## 2016-11-03 ENCOUNTER — Other Ambulatory Visit: Payer: Self-pay | Admitting: *Deleted

## 2016-11-03 DIAGNOSIS — I10 Essential (primary) hypertension: Secondary | ICD-10-CM

## 2016-11-03 MED ORDER — HYDROCHLOROTHIAZIDE 25 MG PO TABS: 25.0000 mg | ORAL_TABLET | Freq: Every day | ORAL | 1 refills | Status: DC

## 2016-11-14 ENCOUNTER — Emergency Department (INDEPENDENT_AMBULATORY_CARE_PROVIDER_SITE_OTHER)
Admission: EM | Admit: 2016-11-14 | Discharge: 2016-11-14 | Disposition: A | Discharge status: Home / Self Care | Payer: BLUE CROSS/BLUE SHIELD | Source: Home / Self Care | Attending: Family Medicine | Admitting: Family Medicine

## 2016-11-14 ENCOUNTER — Encounter: Payer: Self-pay | Admitting: *Deleted

## 2016-11-14 DIAGNOSIS — B9789 Other viral agents as the cause of diseases classified elsewhere: Secondary | ICD-10-CM

## 2016-11-14 DIAGNOSIS — J069 Acute upper respiratory infection, unspecified: Secondary | ICD-10-CM | POA: Diagnosis not present

## 2016-11-14 MED ORDER — PREDNISONE 20 MG PO TABS: ORAL_TABLET | ORAL | 0 refills | Status: DC

## 2016-11-14 MED ORDER — DOXYCYCLINE HYCLATE 100 MG PO CAPS: 100.0000 mg | ORAL_CAPSULE | Freq: Two times a day (BID) | ORAL | 0 refills | Status: DC

## 2016-11-14 MED ORDER — GUAIFENESIN-CODEINE 100-10 MG/5ML PO SOLN: ORAL | 0 refills | Status: DC

## 2016-11-14 NOTE — ED Triage Notes (Signed)
Pt c/o productive cough, hoarseness, sneezing and swelling around her eyes x 2 days. Denies fever.

## 2016-11-14 NOTE — ED Provider Notes (Signed)
Kimberly [facility]    CSN: [id number] Arrival date & time: 11/14/16  1105     History   Chief Complaint Chief Complaint  Patient presents with  . Cough    HPI Kimberly Wade is a 46 y.o. female.   Patient complains of two day history of typical cold-like symptoms including mild sore throat, sinus congestion, headache, fatigue, chills and cough.   She has a history of seasonal rhinitis, and has had right pneumococcal pneumonia in the past.   The history is provided by the patient.    Past Medical History:  Diagnosis Date  . Anxiety   . Diabetes ([place])    States after gastric Bypass Not a diabetic now  . GERD (gastroesophageal reflux disease)   . Hyperlipidemia   . Hypertension   . Insomnia   . Pneumococcal pneumonia ([place]) 2000  . Pneumonia     Patient Active Problem List   Diagnosis Date Noted  . Influenza-like illness 08/29/2016  . Generalized anxiety disorder 08/09/2016  . Low back pain without sciatica 08/08/2016  . Acute cystitis 06/18/2016  . Right trigger finger 05/05/2016  . Right hand pain 05/05/2016  . Anemia 10/26/2015  . Metrorrhagia 09/18/2015  . Ovarian cyst, right 09/14/2015  . Tension headache 08/08/2014  . Primary osteoarthritis of right hand 02/02/2014  . Aspiration into lower respiratory tract 05/02/2013  . Insomnia 12/24/2012  . Depression 12/24/2012  . Migraine 12/24/2012  . Hyperlipidemia 12/24/2012  . Vitamin D deficiency 12/24/2012  . Essential hypertension, benign 12/24/2012  . Type 2 diabetes mellitus ([place]) 12/24/2012  . Anxiety 12/24/2012  . S/P gastric bypass 12/24/2012  . Increased PTH level 12/24/2012  . GERD (gastroesophageal reflux disease) 12/24/2012  . Diabetic retinopathy ([place]) 12/24/2012    Past Surgical History:  Procedure Laterality Date  . CESAREAN SECTION     x4  . CHEST TUBE INSERTION    . chest tubes  2000  . dysplastic mole removed     right ankle  . GASTRIC BYPASS    . INTRAUTERINE DEVICE  (IUD) INSERTION N/A 07/11/2014   Procedure: INTRAUTERINE DEVICE (IUD) INSERTION;  Surgeon: Emily Filbert, MD;  Location: [facility];  Service: Gynecology;  Laterality: N/A;  . IUD REMOVAL N/A 07/11/2014   Procedure: INTRAUTERINE DEVICE (IUD) REMOVAL;  Surgeon: Emily Filbert, MD;  Location: [facility];  Service: Gynecology;  Laterality: N/A;  . mini gastric bypass  2011  . recoonstruction of right lung    . VIDEO BRONCHOSCOPY Bilateral 04/20/2013   Procedure: VIDEO BRONCHOSCOPY WITH FLUORO;  Surgeon: Elsie Stain, MD;  Location: [facility];  Service: Cardiopulmonary;  Laterality: Bilateral;    OB History    Gravida Para Term Preterm AB Living   5 4   4   3    SAB TAB Ectopic Multiple Live Births                   Home Medications    Prior to Admission medications   Medication Sig Start Date End Date Taking? Authorizing Provider  ALPRAZolam Duanne Moron) 1 MG tablet Take 1 tablet (1 mg total) by mouth 2 (two) times daily as needed for anxiety or sleep. 08/08/16   Donella Stade, PA-C  Biotin 5000 MCG TABS Take 1 tablet by mouth 2 (two) times daily.    Historical Provider, MD  busPIRone (BUSPAR) 7.5 MG tablet TAKE ONE TABLET BY MOUTH THREE TIMES A DAY 10/09/16   Donella Stade, PA-C  Calcium Citrate-Vitamin D (  CALCIUM CITRATE + PO) Take 1 tablet by mouth daily.     Historical Provider, MD  doxycycline (VIBRAMYCIN) 100 MG capsule Take 1 capsule (100 mg total) by mouth 2 (two) times daily. Take with food. 11/14/16   Kandra Nicolas, MD  ferrous sulfate 325 (65 FE) MG tablet Take 1 tablet (325 mg total) by mouth 2 (two) times daily with a meal. 12/21/15   Marcial Pacas, DO  guaiFENesin-codeine 100-10 MG/5ML syrup Take 40mL by mouth at bedtime as needed for cough 11/14/16   Kandra Nicolas, MD  hydrochlorothiazide (HYDRODIURIL) 25 MG tablet Take 1-2 tablets (25-50 mg total) by mouth daily. 11/03/16   Jade L Breeback, PA-C  HYDROcodone-acetaminophen (NORCO/VICODIN) 5-325 MG tablet Take 1 tablet by mouth 3 (three)  times daily as needed for moderate pain or severe pain. 10/13/16   Jade L Breeback, PA-C  lisinopril-hydrochlorothiazide (ZESTORETIC) 20-12.5 MG tablet Take 1 tablet by mouth daily. 04/04/16   Marcial Pacas, DO  Multiple Vitamin (MULTIVITAMIN) tablet Take 1 tablet by mouth 3 (three) times daily.    Historical Provider, MD  ondansetron (ZOFRAN-ODT) 8 MG disintegrating tablet Take 1 tablet (8 mg total) by mouth every 8 (eight) hours as needed for nausea. 06/18/16   Silverio Decamp, MD  phentermine (ADIPEX-P) 37.5 MG tablet Take 1 tablet (37.5 mg total) by mouth daily before breakfast. 08/08/16   Jade L Breeback, PA-C  predniSONE (DELTASONE) 20 MG tablet Take one tab by mouth twice daily for 5 days, then one daily for 3 days. Take with food. 11/14/16   Kandra Nicolas, MD  pyridOXINE (VITAMIN B-6) 25 MG tablet Take by mouth.    Historical Provider, MD  SUMAtriptan 6 MG/0.5ML SOAJ INJECT 1 INJECTION UNDER THE SKIN AS NEEDED FOR MIGRAINE MAY REPEAT ONCE IN 2 HOURS IF NOT EFFECTIVE. Due for follow up appointment. 06/01/15   Sean Hommel, DO  traZODone (DESYREL) 100 MG tablet TAKE 1 TO 3 TABLET BY MOUTH AT BEDTIME ONLY IF NEEDED. 04/04/16   Marcial Pacas, DO  venlafaxine XR (EFFEXOR XR) 75 MG 24 hr capsule Take 1 capsule (75 mg total) by mouth daily with breakfast. 08/08/16   Jade L Breeback, PA-C  Vitamin D, Ergocalciferol, (DRISDOL) 50000 units CAPS capsule TAKE 1 CAPSULE BY MOUTH EVERY 7 DAYS. 04/04/16   Marcial Pacas, DO    Family History Family History  Problem Relation Age of Onset  . Colon cancer      grandmother  . Heart attack      grandfather  . Hyperlipidemia    . Diabetes Father     father  . Hypertension Father   . Hypertension Mother   . Asthma Daughter     Social History Social History  Substance Use Topics  . Smoking status: Never Smoker  . Smokeless tobacco: Never Used  . Alcohol use Yes     Comment: 1 glass per month     Allergies   Levaquin [levofloxacin in d5w] and  Penicillins   Review of Systems Review of Systems + sore throat + cough No pleuritic pain No wheezing + nasal congestion + post-nasal drainage + sinus pain/pressure No itchy/red eyes + left earache No hemoptysis + SOB No fever, + chills No nausea No vomiting No abdominal pain No diarrhea No urinary symptoms No skin rash + fatigue No myalgias No headache Used OTC meds without relief   Physical Exam Triage Vital Signs ED Triage Vitals [11/14/16 1146]  Enc Vitals Group     BP  108/69     Pulse Rate 84     Resp 16     Temp 98 F (36.7 C)     Temp Source Oral     SpO2 99 %     Weight 172 lb (78 kg)     Height 5\' 5"  (1.651 m)     Head Circumference      Peak Flow      Pain Score 0     Pain Loc      Pain Edu?      Excl. in [place]?    No data found.   Updated Vital Signs BP 108/69 (BP Location: Left Arm)   Pulse 84   Temp 98 F (36.7 C) (Oral)   Resp 16   Ht 5\' 5"  (1.651 m)   Wt 172 lb (78 kg)   LMP 11/03/2016   SpO2 99%   BMI 28.62 kg/m   Visual Acuity Right Eye Distance:   Left Eye Distance:   Bilateral Distance:    Right Eye Near:   Left Eye Near:    Bilateral Near:     Physical Exam Nursing notes and Vital Signs reviewed. Appearance:  Patient appears stated age, and in no acute distress Eyes:  Pupils are equal, round, and reactive to light and accomodation.  Extraocular movement is intact.  Conjunctivae are not inflamed  Ears:  Canals normal.  Tympanic membranes normal.  Nose:  Mildly congested turbinates.  No sinus tenderness.  Pharynx:  Normal Neck:  Supple.  Tender enlarged posterior/lateral nodes are palpated bilaterally  Lungs:  Clear to auscultation.  Breath sounds are equal.  Moving air well. Heart:  Regular rate and rhythm without murmurs, rubs, or gallops.  Abdomen:  Nontender without masses or hepatosplenomegaly.  Bowel sounds are present.  No CVA or flank tenderness.  Extremities:  No edema.  Skin:  No rash present.    UC  Treatments / Results  Labs (all labs ordered are listed, but only abnormal results are displayed) Labs Reviewed - No data to display  EKG  EKG Interpretation None       Radiology No results found.  Procedures Procedures (including critical care time)  Medications Ordered in UC Medications - No data to display   Initial Impression / Assessment and Plan / UC Course  I have reviewed the triage vital signs and the nursing notes.  Pertinent labs & imaging results that were available during my care of the patient were reviewed by me and considered in my medical decision making (see chart for details).    With a history of pneumococcal pneumonia, will begin empiric doxycycline 100mg  BID. Rx for Robitussin AC for night time cough.  Begin prednisone burst/taper. Take plain guaifenesin (1200mg  extended release tabs such as Mucinex) twice daily, with plenty of water, for cough and congestion.  Get adequate rest.   May use Afrin nasal spray (or generic oxymetazoline) each morning for about 5 days and then discontinue.  Also recommend using saline nasal spray several times daily and saline nasal irrigation (AYR is a common brand).  Use Flonase nasal spray each morning after using Afrin nasal spray and saline nasal irrigation. Try warm salt water gargles for sore throat.  Stop all antihistamines for now, and other non-prescription cough/cold preparations.   Follow-up with family doctor if not improving about10 days.    Final Clinical Impressions(s) / UC Diagnoses   Final diagnoses:  Viral URI with cough    New Prescriptions New Prescriptions  DOXYCYCLINE (VIBRAMYCIN) 100 MG CAPSULE    Take 1 capsule (100 mg total) by mouth 2 (two) times daily. Take with food.   GUAIFENESIN-CODEINE 100-10 MG/5ML SYRUP    Take 16mL by mouth at bedtime as needed for cough   PREDNISONE (DELTASONE) 20 MG TABLET    Take one tab by mouth twice daily for 5 days, then one daily for 3 days. Take with food.       Kandra Nicolas, MD 11/24/16 [phone]

## 2016-11-14 NOTE — Discharge Instructions (Signed)
Take plain guaifenesin (1200mg extended release tabs such as Mucinex) twice daily, with plenty of water, for cough and congestion.  Get adequate rest.   °May use Afrin nasal spray (or generic oxymetazoline) each morning for about 5 days and then discontinue.  Also recommend using saline nasal spray several times daily and saline nasal irrigation (AYR is a common brand).  Use Flonase nasal spray each morning after using Afrin nasal spray and saline nasal irrigation. °Try warm salt water gargles for sore throat.  °Stop all antihistamines for now, and other non-prescription cough/cold preparations. °  °Follow-up with family doctor if not improving about10 days.  °

## 2016-11-17 ENCOUNTER — Other Ambulatory Visit: Payer: Self-pay | Admitting: *Deleted

## 2016-11-17 DIAGNOSIS — F419 Anxiety disorder, unspecified: Secondary | ICD-10-CM

## 2016-11-17 MED ORDER — BUSPIRONE HCL 7.5 MG PO TABS: 7.5000 mg | ORAL_TABLET | Freq: Three times a day (TID) | ORAL | 0 refills | Status: DC

## 2016-12-04 ENCOUNTER — Ambulatory Visit: Payer: BLUE CROSS/BLUE SHIELD | Admitting: Physician Assistant

## 2016-12-05 ENCOUNTER — Ambulatory Visit (INDEPENDENT_AMBULATORY_CARE_PROVIDER_SITE_OTHER): Payer: BLUE CROSS/BLUE SHIELD | Admitting: Physician Assistant

## 2016-12-05 ENCOUNTER — Encounter: Payer: Self-pay | Admitting: Physician Assistant

## 2016-12-05 VITALS — BP 124/74 | HR 102 | Ht 65.0 in | Wt 168.0 lb

## 2016-12-05 DIAGNOSIS — E663 Overweight: Secondary | ICD-10-CM | POA: Diagnosis not present

## 2016-12-05 DIAGNOSIS — F5101 Primary insomnia: Secondary | ICD-10-CM | POA: Diagnosis not present

## 2016-12-05 DIAGNOSIS — F411 Generalized anxiety disorder: Secondary | ICD-10-CM | POA: Diagnosis not present

## 2016-12-05 DIAGNOSIS — I10 Essential (primary) hypertension: Secondary | ICD-10-CM

## 2016-12-05 DIAGNOSIS — M79641 Pain in right hand: Secondary | ICD-10-CM | POA: Diagnosis not present

## 2016-12-05 DIAGNOSIS — F419 Anxiety disorder, unspecified: Secondary | ICD-10-CM | POA: Diagnosis not present

## 2016-12-05 DIAGNOSIS — M545 Low back pain, unspecified: Secondary | ICD-10-CM

## 2016-12-05 DIAGNOSIS — Z9884 Bariatric surgery status: Secondary | ICD-10-CM

## 2016-12-05 DIAGNOSIS — M19041 Primary osteoarthritis, right hand: Secondary | ICD-10-CM | POA: Diagnosis not present

## 2016-12-05 MED ORDER — BUSPIRONE HCL 7.5 MG PO TABS: 7.5000 mg | ORAL_TABLET | Freq: Three times a day (TID) | ORAL | 5 refills | Status: DC

## 2016-12-05 MED ORDER — HYDROCODONE-ACETAMINOPHEN 5-325 MG PO TABS: 1.0000 | ORAL_TABLET | Freq: Three times a day (TID) | ORAL | 0 refills | Status: DC | PRN

## 2016-12-05 MED ORDER — TRAZODONE HCL 100 MG PO TABS: ORAL_TABLET | ORAL | 5 refills | Status: DC

## 2016-12-05 MED ORDER — ALPRAZOLAM 1 MG PO TABS: 1.0000 mg | ORAL_TABLET | Freq: Two times a day (BID) | ORAL | 3 refills | Status: DC | PRN

## 2016-12-05 MED ORDER — PHENTERMINE HCL 37.5 MG PO TABS: 37.5000 mg | ORAL_TABLET | Freq: Every day | ORAL | 0 refills | Status: DC

## 2016-12-07 ENCOUNTER — Encounter: Payer: Self-pay | Admitting: Physician Assistant

## 2016-12-07 NOTE — Progress Notes (Signed)
Subjective:    Patient ID: Kimberly Wade, female    DOB: February 14, 1971, 46 y.o.   MRN: [id number]  HPI Pt is a 46 yo female who presents to the clinic for refills.   Overall she is doing well. She continues to use xanax most days and norco as needed.   She is trying to lose a few more lbs. She is exercising daily. She is struggling to make any weight loss recently.   Trazodone is helping her sleep.    Review of Systems  All other systems reviewed and are negative.      Objective:   Physical Exam  Constitutional: She is oriented to person, place, and time. She appears well-developed and well-nourished.  HENT:  Head: Normocephalic and atraumatic.  Cardiovascular: Normal rate, regular rhythm and normal heart sounds.   Pulmonary/Chest: Effort normal and breath sounds normal. She has no wheezes.  Neurological: She is alert and oriented to person, place, and time.  Psychiatric: She has a normal mood and affect. Her behavior is normal.          Assessment & Plan:  Marland KitchenMarland KitchenDiagnoses and all orders for this visit:  Essential hypertension, benign  Generalized anxiety disorder -     ALPRAZolam (XANAX) 1 MG tablet; Take 1 tablet (1 mg total) by mouth 2 (two) times daily as needed for anxiety or sleep.  Anxiety -     ALPRAZolam (XANAX) 1 MG tablet; Take 1 tablet (1 mg total) by mouth 2 (two) times daily as needed for anxiety or sleep. -     busPIRone (BUSPAR) 7.5 MG tablet; Take 1 tablet (7.5 mg total) by mouth 3 (three) times daily.  Right hand pain -     HYDROcodone-acetaminophen (NORCO/VICODIN) 5-325 MG tablet; Take 1 tablet by mouth 3 (three) times daily as needed for moderate pain or severe pain.  Primary osteoarthritis of right hand -     HYDROcodone-acetaminophen (NORCO/VICODIN) 5-325 MG tablet; Take 1 tablet by mouth 3 (three) times daily as needed for moderate pain or severe pain.  Acute midline low back pain without sciatica -     HYDROcodone-acetaminophen  (NORCO/VICODIN) 5-325 MG tablet; Take 1 tablet by mouth 3 (three) times daily as needed for moderate pain or severe pain.  Primary insomnia -     traZODone (DESYREL) 100 MG tablet; TAKE 1 TO 3 TABLET BY MOUTH AT BEDTIME ONLY IF NEEDED.  S/P gastric bypass -     phentermine (ADIPEX-P) 37.5 MG tablet; Take 1 tablet (37.5 mg total) by mouth daily before breakfast.  Overweight (BMI 25.0-29.9) -     phentermine (ADIPEX-P) 37.5 MG tablet; Take 1 tablet (37.5 mg total) by mouth daily before breakfast.  .. GAD 7 : Generalized Anxiety Score 12/05/2016  Nervous, Anxious, on Edge 1  Control/stop worrying 1  Worry too much - different things 1  Trouble relaxing 1  Restless 1  Easily annoyed or irritable 1  Afraid - awful might happen 0  Total GAD 7 Score 6    .. Depression screen PHQ 2/9 12/05/2016  Decreased Interest 1  Down, Depressed, Hopeless 0  PHQ - 2 Score 1  Altered sleeping 1  Tired, decreased energy 1  Change in appetite 1  Feeling bad or failure about yourself  0  Trouble concentrating 1  Moving slowly or fidgety/restless 0  Suicidal thoughts 0  PHQ-9 Score 5    Pain contract signed.  Opoid tool low risk.  norco refilled. [place] controlled substance database reviewed and  no concerns.   Xanax refilled.   Phentermine givenfor next month.

## 2017-01-27 ENCOUNTER — Other Ambulatory Visit: Payer: Self-pay | Admitting: Physician Assistant

## 2017-01-27 DIAGNOSIS — F330 Major depressive disorder, recurrent, mild: Secondary | ICD-10-CM

## 2017-01-27 DIAGNOSIS — F411 Generalized anxiety disorder: Secondary | ICD-10-CM

## 2017-01-30 ENCOUNTER — Encounter: Payer: Self-pay | Admitting: Physician Assistant

## 2017-01-30 ENCOUNTER — Ambulatory Visit (INDEPENDENT_AMBULATORY_CARE_PROVIDER_SITE_OTHER): Payer: BLUE CROSS/BLUE SHIELD | Admitting: Physician Assistant

## 2017-01-30 VITALS — BP 102/66 | HR 80 | Ht 65.0 in | Wt 165.0 lb

## 2017-01-30 DIAGNOSIS — M545 Low back pain: Secondary | ICD-10-CM

## 2017-01-30 DIAGNOSIS — G894 Chronic pain syndrome: Secondary | ICD-10-CM | POA: Diagnosis not present

## 2017-01-30 DIAGNOSIS — G8929 Other chronic pain: Secondary | ICD-10-CM | POA: Diagnosis not present

## 2017-01-30 DIAGNOSIS — S92414D Nondisplaced fracture of proximal phalanx of right great toe, subsequent encounter for fracture with routine healing: Secondary | ICD-10-CM | POA: Diagnosis not present

## 2017-01-30 DIAGNOSIS — Z9884 Bariatric surgery status: Secondary | ICD-10-CM

## 2017-01-30 DIAGNOSIS — E663 Overweight: Secondary | ICD-10-CM | POA: Diagnosis not present

## 2017-01-30 DIAGNOSIS — M79641 Pain in right hand: Secondary | ICD-10-CM | POA: Diagnosis not present

## 2017-01-30 DIAGNOSIS — M19041 Primary osteoarthritis, right hand: Secondary | ICD-10-CM

## 2017-01-30 DIAGNOSIS — S92414K Nondisplaced fracture of proximal phalanx of right great toe, subsequent encounter for fracture with nonunion: Secondary | ICD-10-CM | POA: Insufficient documentation

## 2017-01-30 DIAGNOSIS — I10 Essential (primary) hypertension: Secondary | ICD-10-CM | POA: Diagnosis not present

## 2017-01-30 MED ORDER — PHENTERMINE HCL 37.5 MG PO TABS: 37.5000 mg | ORAL_TABLET | Freq: Every day | ORAL | 0 refills | Status: DC

## 2017-01-30 MED ORDER — LISINOPRIL-HYDROCHLOROTHIAZIDE 20-12.5 MG PO TABS: 1.0000 | ORAL_TABLET | Freq: Every day | ORAL | 3 refills | Status: DC

## 2017-01-30 MED ORDER — HYDROCODONE-ACETAMINOPHEN 5-325 MG PO TABS: 1.0000 | ORAL_TABLET | Freq: Three times a day (TID) | ORAL | 0 refills | Status: DC | PRN

## 2017-01-30 NOTE — Progress Notes (Signed)
Subjective:    Patient ID: Kimberly Wade, female    DOB: 1971/01/27, 46 y.o.   MRN: [id number]  HPI  Pt is a 46 yo female who presents to the clinic for her 3 month follow up for chronic pain medications.   Pt is on a pain contract with norco for right hand pain and low back pain. She on average takes one tablet a day. She has managed on may 12 to break her great toe of her right foot. She is in a cam boot and being managed by [facility]. She is not allowed to bear weight and they will not give any more pain medication. She has been using norco 3 times a day since injury. norco last filled in April.   She is also concerned because due to mobility issues she has put on some weight. She has had gastric bypass and sensitive to any weight gain. She would like to have a few months of phentermine. She tolerates it well with no side effects.   HTN- needs refills. No problems or concerns. No CP, palpitations, headaches, or vision changes.   .. Active Ambulatory Problems    Diagnosis Date Noted  . Insomnia 12/24/2012  . Depression 12/24/2012  . Migraine 12/24/2012  . Hyperlipidemia 12/24/2012  . Vitamin D deficiency 12/24/2012  . Essential hypertension, benign 12/24/2012  . Anxiety 12/24/2012  . S/P gastric bypass 12/24/2012  . Increased PTH level 12/24/2012  . GERD (gastroesophageal reflux disease) 12/24/2012  . Aspiration into lower respiratory tract 05/02/2013  . Primary osteoarthritis of right hand 02/02/2014  . Tension headache 08/08/2014  . Ovarian cyst, right 09/14/2015  . Metrorrhagia 09/18/2015  . Anemia 10/26/2015  . Right trigger finger 05/05/2016  . Right hand pain 05/05/2016  . Low back pain without sciatica 08/08/2016  . Generalized anxiety disorder 08/09/2016  . Influenza-like illness 08/29/2016  . Closed nondisplaced fracture of proximal phalanx of right great toe with routine healing 01/30/2017  . Overweight (BMI 25.0-29.9) 02/01/2017   Resolved  Ambulatory Problems    Diagnosis Date Noted  . Lingular pneumonia 12/24/2012  . Type 2 diabetes mellitus ([place]) 12/24/2012  . Sinusitis, chronic 12/24/2012  . Diabetic retinopathy ([place]) 12/24/2012  . Acute cystitis 06/18/2016   Past Medical History:  Diagnosis Date  . Anxiety   . Diabetes ([place])   . GERD (gastroesophageal reflux disease)   . Hyperlipidemia   . Hypertension   . Insomnia   . Pneumococcal pneumonia ([place]) 2000  . Pneumonia       Review of Systems  All other systems reviewed and are negative.      Objective:   Physical Exam  Constitutional: She appears well-developed and well-nourished.  Cardiovascular: Normal rate, regular rhythm and normal heart sounds.   Pulmonary/Chest: Effort normal and breath sounds normal.  Musculoskeletal:  Right foot in cam walker boot.           Assessment & Plan:  Marland KitchenMarland KitchenDiagnoses and all orders for this visit:  Closed nondisplaced fracture of proximal phalanx of right great toe with routine healing -     HYDROcodone-acetaminophen (NORCO/VICODIN) 5-325 MG tablet; Take 1 tablet by mouth 3 (three) times daily as needed for moderate pain or severe pain.  Right hand pain -     HYDROcodone-acetaminophen (NORCO/VICODIN) 5-325 MG tablet; Take 1 tablet by mouth 3 (three) times daily as needed for moderate pain or severe pain.  Primary osteoarthritis of right hand -     HYDROcodone-acetaminophen (NORCO/VICODIN) 5-325 MG tablet;  Take 1 tablet by mouth 3 (three) times daily as needed for moderate pain or severe pain.  Chronic bilateral low back pain without sciatica -     HYDROcodone-acetaminophen (NORCO/VICODIN) 5-325 MG tablet; Take 1 tablet by mouth 3 (three) times daily as needed for moderate pain or severe pain.  S/P gastric bypass -     phentermine (ADIPEX-P) 37.5 MG tablet; Take 1 tablet (37.5 mg total) by mouth daily before breakfast.  Overweight (BMI 25.0-29.9) -     phentermine (ADIPEX-P) 37.5 MG tablet; Take 1 tablet (37.5 mg  total) by mouth daily before breakfast.  Essential hypertension -     lisinopril-hydrochlorothiazide (ZESTORETIC) 20-12.5 MG tablet; Take 1 tablet by mouth daily.  ..  Reviewed [place] controlled substance database.  On pain contract. Refilled norco.  Discussed needed to back off on norco since it has been almost a month since injury.  Phentermine given for one month. Needs nurse visit follow up. Encouraged counting calories and trying to still work out the upper body.

## 2017-02-01 DIAGNOSIS — E663 Overweight: Secondary | ICD-10-CM | POA: Insufficient documentation

## 2017-02-10 ENCOUNTER — Encounter: Payer: Self-pay | Admitting: *Deleted

## 2017-02-10 ENCOUNTER — Emergency Department (INDEPENDENT_AMBULATORY_CARE_PROVIDER_SITE_OTHER)
Admission: EM | Admit: 2017-02-10 | Discharge: 2017-02-10 | Disposition: A | Discharge status: Home / Self Care | Payer: BLUE CROSS/BLUE SHIELD | Source: Home / Self Care | Attending: Family Medicine | Admitting: Family Medicine

## 2017-02-10 DIAGNOSIS — N3001 Acute cystitis with hematuria: Secondary | ICD-10-CM

## 2017-02-10 DIAGNOSIS — R300 Dysuria: Secondary | ICD-10-CM

## 2017-02-10 LAB — POCT URINALYSIS DIP (MANUAL ENTRY)
Bilirubin, UA: NEGATIVE
Glucose, UA: NEGATIVE mg/dL
Nitrite, UA: NEGATIVE
Protein Ur, POC: 100 mg/dL — AB
Spec Grav, UA: >=1.03 — AB (ref 1.010–1.025)
Urobilinogen, UA: 0.2 E.U./dL
pH, UA: 5.5 (ref 5.0–8.0)

## 2017-02-10 MED ORDER — CEPHALEXIN 500 MG PO CAPS: 500.0000 mg | ORAL_CAPSULE | Freq: Two times a day (BID) | ORAL | 0 refills | Status: DC

## 2017-02-10 MED ORDER — PHENAZOPYRIDINE HCL 200 MG PO TABS: 200.0000 mg | ORAL_TABLET | Freq: Three times a day (TID) | ORAL | 0 refills | Status: DC

## 2017-02-10 NOTE — ED Triage Notes (Signed)
Patient c/o 2 days of gross hematuria, dysuria and bladder pain. H/o kidney stones. Denies fever or flank pain.

## 2017-02-10 NOTE — ED Provider Notes (Signed)
CSN: [id number]     Arrival date & time 02/10/17  1331 History   First MD Initiated Contact with Patient 02/10/17 1349     Chief Complaint  Patient presents with  . Dysuria  . Hematuria   (Consider location/radiation/quality/duration/timing/severity/associated sxs/prior Treatment) HPI Kimberly Wade is a 46 y.o. female presenting to UC with c/o dysuria and hematuria for 2 days. Mild lower abdominal pain and discomfort with pressure.  She has not taken anything for symptoms PTA. Denies back pain, fever, chills, n/v/d. Her last UTI was about 1 year ago.  PCN allergy- hives, but she has had keflex before w/o reaction.    Past Medical History:  Diagnosis Date  . Anxiety   . Diabetes ([place])    States after gastric Bypass Not a diabetic now  . GERD (gastroesophageal reflux disease)   . Hyperlipidemia   . Hypertension   . Insomnia   . Pneumococcal pneumonia ([place]) 2000  . Pneumonia    Past Surgical History:  Procedure Laterality Date  . CESAREAN SECTION     x4  . CHEST TUBE INSERTION    . chest tubes  2000  . dysplastic mole removed     right ankle  . GASTRIC BYPASS    . INTRAUTERINE DEVICE (IUD) INSERTION N/A 07/11/2014   Procedure: INTRAUTERINE DEVICE (IUD) INSERTION;  Surgeon: Emily Filbert, MD;  Location: [facility];  Service: Gynecology;  Laterality: N/A;  . IUD REMOVAL N/A 07/11/2014   Procedure: INTRAUTERINE DEVICE (IUD) REMOVAL;  Surgeon: Emily Filbert, MD;  Location: [facility];  Service: Gynecology;  Laterality: N/A;  . mini gastric bypass  2011  . recoonstruction of right lung    . VIDEO BRONCHOSCOPY Bilateral 04/20/2013   Procedure: VIDEO BRONCHOSCOPY WITH FLUORO;  Surgeon: Elsie Stain, MD;  Location: [facility];  Service: Cardiopulmonary;  Laterality: Bilateral;   Family History  Problem Relation Age of Onset  . Colon cancer Unknown        grandmother  . Heart attack Unknown        grandfather  . Hyperlipidemia Unknown   . Diabetes Father        father  .  Hypertension Father   . Hypertension Mother   . Asthma Daughter    Social History  Substance Use Topics  . Smoking status: Never Smoker  . Smokeless tobacco: Never Used  . Alcohol use Yes     Comment: 1 glass per month   OB History    Gravida Para Term Preterm AB Living   5 4   4   3    SAB TAB Ectopic Multiple Live Births                 Review of Systems  Constitutional: Negative for chills and fever.  Gastrointestinal: Positive for abdominal pain (pressure, discomfort). Negative for diarrhea, nausea and vomiting.  Genitourinary: Positive for dysuria, frequency, hematuria and urgency. Negative for flank pain and pelvic pain.  Musculoskeletal: Negative for back pain and myalgias.    Allergies  Levaquin [levofloxacin in d5w] and Penicillins  Home Medications   Prior to Admission medications   Medication Sig Start Date End Date Taking? Authorizing Provider  ALPRAZolam Duanne Moron) 1 MG tablet Take 1 tablet (1 mg total) by mouth 2 (two) times daily as needed for anxiety or sleep. 12/05/16   Breeback, Royetta Car, PA-C  Biotin 5000 MCG TABS Take 1 tablet by mouth 2 (two) times daily.    [provider]  busPIRone (BUSPAR)  7.5 MG tablet Take 1 tablet (7.5 mg total) by mouth 3 (three) times daily. 12/05/16   Breeback, Luvenia Starch L, PA-C  Calcium Citrate-Vitamin D (CALCIUM CITRATE + PO) Take 1 tablet by mouth daily.     [provider]  cephALEXin (KEFLEX) 500 MG capsule Take 1 capsule (500 mg total) by mouth 2 (two) times daily. 02/10/17   Noe Gens, PA-C  ferrous sulfate 325 (65 FE) MG tablet Take 1 tablet (325 mg total) by mouth 2 (two) times daily with a meal. 12/21/15   Hommel, Sean, DO  hydrochlorothiazide (HYDRODIURIL) 25 MG tablet Take 1-2 tablets (25-50 mg total) by mouth daily. 11/03/16   Breeback, Royetta Car, PA-C  HYDROcodone-acetaminophen (NORCO/VICODIN) 5-325 MG tablet Take 1 tablet by mouth 3 (three) times daily as needed for moderate pain or severe pain. 01/30/17    Breeback, Royetta Car, PA-C  lisinopril-hydrochlorothiazide (ZESTORETIC) 20-12.5 MG tablet Take 1 tablet by mouth daily. 01/30/17   Breeback, Royetta Car, PA-C  Multiple Vitamin (MULTIVITAMIN) tablet Take 1 tablet by mouth 3 (three) times daily.    [provider]  ondansetron (ZOFRAN-ODT) 8 MG disintegrating tablet Take 1 tablet (8 mg total) by mouth every 8 (eight) hours as needed for nausea. 06/18/16   Silverio Decamp, MD  phenazopyridine (PYRIDIUM) 200 MG tablet Take 1 tablet (200 mg total) by mouth 3 (three) times daily. 02/10/17   Noe Gens, PA-C  phentermine (ADIPEX-P) 37.5 MG tablet Take 1 tablet (37.5 mg total) by mouth daily before breakfast. 01/30/17   Breeback, Jade L, PA-C  pyridOXINE (VITAMIN B-6) 25 MG tablet Take by mouth.    [provider]  SUMAtriptan 6 MG/0.5ML SOAJ INJECT 1 INJECTION UNDER THE SKIN AS NEEDED FOR MIGRAINE MAY REPEAT ONCE IN 2 HOURS IF NOT EFFECTIVE. Due for follow up appointment. 06/01/15   Marcial Pacas, DO  traZODone (DESYREL) 100 MG tablet TAKE 1 TO 3 TABLET BY MOUTH AT BEDTIME ONLY IF NEEDED. 12/05/16   Breeback, Jade L, PA-C  venlafaxine XR (EFFEXOR-XR) 75 MG 24 hr capsule TAKE ONE CAPSULE BY MOUTH DAILY FOR BREAKFAST 01/28/17   Breeback, Jade L, PA-C  Vitamin D, Ergocalciferol, (DRISDOL) 50000 units CAPS capsule TAKE 1 CAPSULE BY MOUTH EVERY 7 DAYS. 04/04/16   Marcial Pacas, DO   Meds Ordered and Administered this Visit  Medications - No data to display  BP 95/60 (BP Location: Left Arm)   Pulse 84   Temp 98.7 F (37.1 C) (Oral)   Wt 159 lb (72.1 kg)   LMP 01/27/2017   SpO2 98%   BMI 26.46 kg/m  No data found.   Physical Exam  Constitutional: She is oriented to person, place, and time. She appears well-developed and well-nourished. No distress.  HENT:  Head: Normocephalic and atraumatic.  Mouth/Throat: Oropharynx is clear and moist.  Eyes: EOM are normal.  Neck: Normal range of motion.  Cardiovascular: Normal rate and regular  rhythm.   Pulmonary/Chest: Effort normal and breath sounds normal. No respiratory distress. She has no wheezes. She has no rales.  Abdominal: Soft. She exhibits no distension. There is no tenderness. There is no CVA tenderness.  Musculoskeletal: Normal range of motion.  Neurological: She is alert and oriented to person, place, and time.  Skin: Skin is warm and dry. She is not diaphoretic.  Psychiatric: She has a normal mood and affect. Her behavior is normal.  Nursing note and vitals reviewed.   Urgent Care Course     Procedures (including critical care  time)  Labs Review Labs Reviewed  POCT URINALYSIS DIP (MANUAL ENTRY) - Abnormal; Notable for the following:       Result Value   Ketones, POC UA small (15) (*)    Spec Grav, UA >=1.030 (*)    Blood, UA moderate (*)    Protein Ur, POC =100 (*)    Leukocytes, UA Trace (*)    All other components within normal limits  URINE CULTURE    Imaging Review No results found.   MDM   1. Dysuria   2. Acute cystitis with hematuria    UA c/w UTI, culture sent  Rx: Keflex and pyridium Encouraged good hydration F/u with PCP in 1 week as needed.     Noe Gens, [place] 02/10/17 1358

## 2017-02-10 NOTE — Discharge Instructions (Signed)
° °  Pyridium has been prescribed to help with urinary discomfort and bladder spasms.  This medication can cause your urine to be orange, this is normal.

## 2017-02-11 LAB — URINE CULTURE: Organism ID, Bacteria: NO GROWTH

## 2017-02-12 ENCOUNTER — Telehealth: Payer: Self-pay

## 2017-02-12 NOTE — Telephone Encounter (Signed)
Left message with results and to call office if any questions.

## 2017-02-23 ENCOUNTER — Other Ambulatory Visit: Payer: Self-pay | Admitting: Physician Assistant

## 2017-02-23 DIAGNOSIS — I10 Essential (primary) hypertension: Secondary | ICD-10-CM

## 2017-03-19 ENCOUNTER — Telehealth: Payer: Self-pay | Admitting: *Deleted

## 2017-03-19 DIAGNOSIS — G8929 Other chronic pain: Secondary | ICD-10-CM

## 2017-03-19 DIAGNOSIS — M545 Low back pain: Secondary | ICD-10-CM

## 2017-03-19 DIAGNOSIS — M19041 Primary osteoarthritis, right hand: Secondary | ICD-10-CM

## 2017-03-19 DIAGNOSIS — M79641 Pain in right hand: Secondary | ICD-10-CM

## 2017-03-19 DIAGNOSIS — S92414D Nondisplaced fracture of proximal phalanx of right great toe, subsequent encounter for fracture with routine healing: Secondary | ICD-10-CM

## 2017-03-19 MED ORDER — HYDROCODONE-ACETAMINOPHEN 5-325 MG PO TABS: 1.0000 | ORAL_TABLET | Freq: Three times a day (TID) | ORAL | 0 refills | Status: DC | PRN

## 2017-03-19 NOTE — Telephone Encounter (Signed)
Patient is requesting a refill on hydrocodone.    Please advise.

## 2017-03-19 NOTE — Telephone Encounter (Signed)
Prescription for 1 month supply signed and at the desk Checked [place], no red flags Patient needs an appointment with Kimberly Wade in September for further refills

## 2017-04-03 ENCOUNTER — Encounter: Payer: Self-pay | Admitting: Sports Medicine

## 2017-04-03 ENCOUNTER — Ambulatory Visit (INDEPENDENT_AMBULATORY_CARE_PROVIDER_SITE_OTHER): Payer: BLUE CROSS/BLUE SHIELD

## 2017-04-03 ENCOUNTER — Ambulatory Visit (INDEPENDENT_AMBULATORY_CARE_PROVIDER_SITE_OTHER): Payer: BLUE CROSS/BLUE SHIELD | Admitting: Sports Medicine

## 2017-04-03 DIAGNOSIS — M7552 Bursitis of left shoulder: Secondary | ICD-10-CM

## 2017-04-03 DIAGNOSIS — X58XXXD Exposure to other specified factors, subsequent encounter: Secondary | ICD-10-CM

## 2017-04-03 DIAGNOSIS — M19012 Primary osteoarthritis, left shoulder: Secondary | ICD-10-CM | POA: Diagnosis not present

## 2017-04-03 DIAGNOSIS — S92414D Nondisplaced fracture of proximal phalanx of right great toe, subsequent encounter for fracture with routine healing: Secondary | ICD-10-CM

## 2017-04-03 MED ORDER — MELOXICAM 15 MG PO TABS: ORAL_TABLET | ORAL | 3 refills | Status: DC

## 2017-04-03 NOTE — Assessment & Plan Note (Signed)
Subacromial injection, x-rays, formal PT.

## 2017-04-03 NOTE — Progress Notes (Signed)
Subjective:    I'm seeing this patient as a consultation for:  Iran Planas, PA-C  CC: Multiple issues  HPI: Left shoulder pain: Present for a month now, localized over the deltoid, worse with overhead activities, moderate, persistent without radiation.  Right great toe pain: 2 months ago had a displaced, comminuted, distal phalangeal fracture. Was seen at an outside orthopedist, treated conservatively. Things were going okay, then without any reinjury she has had a severe recurrence of pain, swelling. Bruising. Localized without radiation.  Past medical history, Surgical history, Family history not pertinant except as noted below, Social history, Allergies, and medications have been entered into the medical record, reviewed, and no changes needed.   Review of Systems: No headache, visual changes, nausea, vomiting, diarrhea, constipation, dizziness, abdominal pain, skin rash, fevers, chills, night sweats, weight loss, swollen lymph nodes, body aches, joint swelling, muscle aches, chest pain, shortness of breath, mood changes, visual or auditory hallucinations.   Objective:   General: Well Developed, well nourished, and in no acute distress.  Neuro:  Extra-ocular muscles intact, able to move all 4 extremities, sensation grossly intact.  Deep tendon reflexes tested were normal. Psych: Alert and oriented, mood congruent with affect. ENT:  Ears and nose appear unremarkable.  Hearing grossly normal. Neck: Unremarkable overall appearance, trachea midline.  No visible thyroid enlargement. Eyes: Conjunctivae and lids appear unremarkable.  Pupils equal and round. Skin: Warm and dry, no rashes noted.  Cardiovascular: Pulses palpable, no extremity edema. Left Shoulder: Inspection reveals no abnormalities, atrophy or asymmetry. Palpation is normal with no tenderness over AC joint or bicipital groove. ROM is full in all planes. Rotator cuff strength normal throughout. Strongly positive Neer and  Hawkin's tests, empty can. Speeds and Yergason's tests normal. No labral pathology noted with negative Obrien's, negative crank, negative clunk, and good stability. Normal scapular function observed. No painful arc and no drop arm sign. No apprehension sign Right Foot: Great toe is swollen, bruised, exquisitely tender to palpation Range of motion is full in all directions. Strength is 5/5 in all directions. No hallux valgus. No pes cavus or pes planus. No abnormal callus noted. No pain over the navicular prominence, or base of fifth metatarsal. No tenderness to palpation of the calcaneal insertion of plantar fascia. No pain at the Achilles insertion. No pain over the calcaneal bursa. No pain of the retrocalcaneal bursa. No tenderness to palpation over the tarsals, metatarsals, or phalanges. No hallux rigidus or limitus. No tenderness palpation over interphalangeal joints. No pain with compression of the metatarsal heads. Neurovascularly intact distally.  Procedure: Real-time Ultrasound Guided Injection of left subacromial bursa Device: GE Logiq E  Verbal informed consent obtained.  Time-out conducted.  Noted no overlying erythema, induration, or other signs of local infection.  Skin prepped in a sterile fashion.  Local anesthesia: Topical Ethyl chloride.  With sterile technique and under real time ultrasound guidance:  25-gauge needle advanced into the bursa, I injected 1 mL kenalog 40, 1 mL lidocaine, 1 mL bupivacaine. Completed without difficulty  Pain immediately resolved suggesting accurate placement of the medication.  Advised to call if fevers/chills, erythema, induration, drainage, or persistent bleeding.  Images permanently stored and available for review in the ultrasound unit.  Impression: Technically successful ultrasound guided injection.  Impression and Recommendations:   This case required medical decision making of moderate complexity.  Acute shoulder bursitis,  left Subacromial injection, x-rays, formal PT.  Closed nondisplaced fracture of proximal phalanx of right great toe with routine healing Over  2 months out, persistent pain with increase in swelling. 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 X-rays, MRI. Postop shoe. Return to go over MRI results.

## 2017-04-03 NOTE — Assessment & Plan Note (Signed)
Over 2 months out, persistent pain with increase in swelling. 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 X-rays, MRI. Postop shoe. Return to go over MRI results.

## 2017-04-03 NOTE — Addendum Note (Signed)
Addended by: Silverio Decamp on: 04/03/2017 04:12 PM   Modules accepted: Orders

## 2017-04-09 ENCOUNTER — Encounter: Payer: Self-pay | Admitting: Sports Medicine

## 2017-04-09 ENCOUNTER — Ambulatory Visit (INDEPENDENT_AMBULATORY_CARE_PROVIDER_SITE_OTHER): Payer: BLUE CROSS/BLUE SHIELD | Admitting: Sports Medicine

## 2017-04-09 DIAGNOSIS — M7552 Bursitis of left shoulder: Secondary | ICD-10-CM

## 2017-04-09 DIAGNOSIS — S92414D Nondisplaced fracture of proximal phalanx of right great toe, subsequent encounter for fracture with routine healing: Secondary | ICD-10-CM | POA: Diagnosis not present

## 2017-04-09 MED ORDER — CYCLOBENZAPRINE HCL 10 MG PO TABS: ORAL_TABLET | ORAL | 3 refills | Status: DC

## 2017-04-09 MED ORDER — DOXYCYCLINE HYCLATE 100 MG PO TABS: 100.0000 mg | ORAL_TABLET | Freq: Two times a day (BID) | ORAL | 0 refills | Status: AC

## 2017-04-09 NOTE — Assessment & Plan Note (Signed)
There were some fracture blisters overlying. I do suspect nonunion over 2 months out. Continue postop shoe, there was a touch of erythema so adding a course of doxycycline. Ultimately need the MRI before we proceed further.

## 2017-04-09 NOTE — Progress Notes (Signed)
Subjective:    CC: Follow-up  HPI: Left shoulder pain: I did a subacromial injection several days ago, she initially suspected that she didn't have any relief but on further questioning she no longer has any impingement related pain, her pain in the left shoulder is now anterior, worse with shoulder flexion. No radiation past the elbow.  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 There is a bit of pain and erythema over the toe itself  Past medical history:  Negative.  See flowsheet/record as well for more information.  Surgical history: Negative.  See flowsheet/record as well for more information.  Family history: Negative.  See flowsheet/record as well for more information.  Social history: Negative.  See flowsheet/record as well for more information.  Allergies, and medications have been entered into the medical record, reviewed, and no changes needed.   Review of Systems: No fevers, chills, night sweats, weight loss, chest pain, or shortness of breath.   Objective:    General: Well Developed, well nourished, and in no acute distress.  Neuro: Alert and oriented x3, extra-ocular muscles intact, sensation grossly intact.  HEENT: Normocephalic, atraumatic, pupils equal round reactive to light, neck supple, no masses, no lymphadenopathy, thyroid nonpalpable.  Skin: Warm and dry, no rashes. Cardiac: Regular rate and rhythm, no murmurs rubs or gallops, no lower extremity edema.  Respiratory: Clear to auscultation bilaterally. Not using accessory muscles, speaking in full sentences. Left Shoulder: Inspection reveals no abnormalities, atrophy or asymmetry. Palpation is normal with no tenderness over AC joint or bicipital groove. ROM is full in all planes. Rotator cuff strength normal throughout. No signs of impingement with negative Neer and Hawkin's tests, empty can. Speeds and Yergason's tests both positive No labral pathology noted with negative Obrien's, negative  crank, negative clunk, and good stability. Normal scapular function observed. No painful arc and no drop arm sign. No apprehension sign Right Foot: Minimal erythema with tenderness over the distal phalanx and at the paronychium and eponychium. Range of motion is full in all directions. Strength is 5/5 in all directions. No hallux valgus. No pes cavus or pes planus. No abnormal callus noted. No pain over the navicular prominence, or base of fifth metatarsal. No tenderness to palpation of the calcaneal insertion of plantar fascia. No pain at the Achilles insertion. No pain over the calcaneal bursa. No pain of the retrocalcaneal bursa. No tenderness to palpation over the tarsals, metatarsals, or phalanges. No hallux rigidus or limitus. No tenderness palpation over interphalangeal joints. No pain with compression of the metatarsal heads. Neurovascularly intact distally.  Procedure: Real-time Ultrasound Guided Injection of left bicipital groove Device: GE Logiq E  Verbal informed consent obtained.  Time-out conducted.  Noted no overlying erythema, induration, or other signs of local infection.  Skin prepped in a sterile fashion.  Local anesthesia: Topical Ethyl chloride.  With sterile technique and under real time ultrasound guidance:  Using a 25-gauge needle and taking care to avoid injection into the biceps tendon, I guided the needle just deep to the biceps tendon and over the bicipital groove of the humerus, I then injected 1 mL kenalog 40, 1 mL lidocaine, 1 mL bupivacaine Completed without difficulty  Pain immediately resolved suggesting accurate placement of the medication.  Advised to call if fevers/chills, erythema, induration, drainage, or persistent bleeding.  Images permanently stored and available for review in the ultrasound unit.  Impression: Technically successful ultrasound guided injection.  Impression and Recommendations:    Acute shoulder bursitis,  left Impingement  type pain has resolved after subacromial injection. Still has some bicipital type pain. Biceps sheath injection as above, continue with PT. Flexeril at bedtime to help her sleep, she is having significant pain at night. No paresthesias.  Closed nondisplaced fracture of proximal phalanx of right great toe with routine healing There were some fracture blisters overlying. I do suspect nonunion over 2 months out. Continue postop shoe, there was a touch of erythema so adding a course of doxycycline. Ultimately need the MRI before we proceed further.

## 2017-04-09 NOTE — Assessment & Plan Note (Signed)
Impingement type pain has resolved after subacromial injection. Still has some bicipital type pain. Biceps sheath injection as above, continue with PT. Flexeril at bedtime to help her sleep, she is having significant pain at night. No paresthesias.

## 2017-04-16 ENCOUNTER — Ambulatory Visit (INDEPENDENT_AMBULATORY_CARE_PROVIDER_SITE_OTHER): Payer: BLUE CROSS/BLUE SHIELD | Admitting: Physical Therapy

## 2017-04-16 DIAGNOSIS — M25512 Pain in left shoulder: Secondary | ICD-10-CM | POA: Diagnosis not present

## 2017-04-16 DIAGNOSIS — R293 Abnormal posture: Secondary | ICD-10-CM

## 2017-04-16 DIAGNOSIS — M25612 Stiffness of left shoulder, not elsewhere classified: Secondary | ICD-10-CM | POA: Diagnosis not present

## 2017-04-16 NOTE — Therapy (Signed)
[place], [place], [address] Phone: [phone]   Fax:  [phone]  Physical Therapy Evaluation  Patient Details  Name: Kimberly Wade MRN: [id number] Date of Birth: 03/03/1971 Referring Provider: Silverio Decamp, MD  Encounter Date: 04/16/2017      PT End of Session - 04/16/17 1012    Visit Number 1   Number of Visits 12   Date for PT Re-Evaluation 05/28/17   Authorization Type BCBS   PT Start Time 0845   PT Stop Time 0922   PT Time Calculation (min) 37 min   Activity Tolerance Patient tolerated treatment well;Patient limited by pain   Behavior During Therapy [facility] for tasks assessed/performed      Past Medical History:  Diagnosis Date  . Anxiety   . Diabetes ([place])    States after gastric Bypass Not a diabetic now  . GERD (gastroesophageal reflux disease)   . Hyperlipidemia   . Hypertension   . Insomnia   . Pneumococcal pneumonia ([place]) 2000  . Pneumonia     Past Surgical History:  Procedure Laterality Date  . CESAREAN SECTION     x4  . CHEST TUBE INSERTION    . chest tubes  2000  . dysplastic mole removed     right ankle  . GASTRIC BYPASS    . INTRAUTERINE DEVICE (IUD) INSERTION N/A 07/11/2014   Procedure: INTRAUTERINE DEVICE (IUD) INSERTION;  Surgeon: Emily Filbert, MD;  Location: [facility];  Service: Gynecology;  Laterality: N/A;  . IUD REMOVAL N/A 07/11/2014   Procedure: INTRAUTERINE DEVICE (IUD) REMOVAL;  Surgeon: Emily Filbert, MD;  Location: [facility];  Service: Gynecology;  Laterality: N/A;  . mini gastric bypass  2011  . recoonstruction of right lung    . VIDEO BRONCHOSCOPY Bilateral 04/20/2013   Procedure: VIDEO BRONCHOSCOPY WITH FLUORO;  Surgeon: Elsie Stain, MD;  Location: [facility];  Service: Cardiopulmonary;  Laterality: Bilateral;    There were no vitals filed for this visit.       Subjective Assessment - 04/16/17 0849    Subjective Pt is a 46 y/o  female who presents to [facility] for Lt shoulder pain x 2-3 months.  Pt unsure cause of injury, but also has Rt foot fx and was using crutches and pain began shortly after.  Pt has had injection of rotator cuff and proximal biceps with some improvement.  Pt reports continued pain affecting function.   Limitations Lifting;House hold activities   Patient Stated Goals improve pain, perform dressing without pain   Currently in Pain? Yes   Pain Score 0-No pain  up to 5/10   Pain Location Shoulder   Pain Orientation Left;Anterior;Lateral   Pain Descriptors / Indicators Spasm;Cramping;Shooting   Pain Type Acute pain;Chronic pain   Pain Radiating Towards to elbow   Pain Onset More than a month ago   Pain Frequency Intermittent   Aggravating Factors  lifting overhead, carrying objects in front   Pain Relieving Factors avoid aggravating factors            [facility] PT Assessment - 04/16/17 0853      Assessment   Medical Diagnosis Lt shoulder pain   Referring Provider Silverio Decamp, MD   Onset Date/Surgical Date --  2-3 months ago   Hand Dominance Right   Next MD Visit 05/07/17   Prior Therapy none     Precautions   Precautions None     Restrictions   Weight  Bearing Restrictions No     Balance Screen   Has the patient fallen in the past 6 months Yes   How many times? 1-resulting in foot fx   Has the patient had a decrease in activity level because of a fear of falling?  No   Is the patient reluctant to leave their home because of a fear of falling?  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   Leisure spend time with children; marching band     Cognition    Overall Cognitive Status Within Functional Limits for tasks assessed     Observation/Other Assessments   Focus on Therapeutic Outcomes (FOTO)  61 (39% limited; predicted 29% limited)     ROM / Strength   AROM / PROM / Strength AROM;Strength;PROM     AROM   Overall AROM Comments measured supine   AROM Assessment Site Shoulder   Right/Left Shoulder Right;Left   Right Shoulder Flexion 170 Degrees   Right Shoulder ABduction 175 Degrees   Right Shoulder Internal Rotation 80 Degrees   Right Shoulder External Rotation 75 Degrees   Left Shoulder Flexion 162 Degrees   Left Shoulder ABduction 175 Degrees   Left Shoulder Internal Rotation 82 Degrees   Left Shoulder External Rotation 67 Degrees     PROM   PROM Assessment Site Shoulder   Right/Left Shoulder Left   Left Shoulder External Rotation 78 Degrees  no pain just tightness     Strength   Strength Assessment Site Shoulder   Right/Left Shoulder Left   Left Shoulder Flexion 3+/5  pain   Left Shoulder ABduction 4-/5  pain   Left Shoulder Internal Rotation 4/5   Left Shoulder External Rotation 4-/5  mild pain     Palpation   Palpation comment tenderness Lt proximal biceps tendon            Objective measurements completed on examination: See above findings.          [facility] Adult PT Treatment/Exercise - 04/16/17 0923      Exercises   Exercises Shoulder     Shoulder Exercises: Seated   Other Seated Exercises instructed in shoulder rolls and scapular retraction     Shoulder Exercises: Stretch   Other Shoulder Stretches low doorway stretch x30 sec - for HEP instruction     Modalities   Modalities Iontophoresis     Iontophoresis   Type of Iontophoresis Dexamethasone   Location Lt shoulder; biceps tendon   Dose 1.0 cc   Time 6 hour patch                PT Education - 04/16/17 1012    Education provided Yes   Education Details ionto, HEP   Person(s) Educated Patient   Methods  Explanation;Handout;Demonstration   Comprehension Verbalized understanding;Returned demonstration;Need further instruction             PT Long Term Goals - 04/16/17 1224      PT LONG TERM GOAL #1   Title independent with HEP   Time 6   Period Weeks   Status New   Target Date 05/28/17     PT LONG TERM GOAL #2  Title verbalize understanding of posture/body mechanics to decrease risk of reinjury   Time 6   Period Weeks   Status New   Target Date 05/28/17     PT LONG TERM GOAL #3   Title perform Lt shoulder ROM without pain for improved function and pain   Time 6   Period Weeks   Status New   Target Date 05/28/17     PT LONG TERM GOAL #4   Title report 75% improvement in UB dressing for improved function   Time 6   Period Weeks   Status New   Target Date 05/28/17     PT LONG TERM GOAL #5   Title demonstrate at least 4/5 Lt shoulder strength in order to return to overhead lifting and regular activities   Time 6   Period Weeks   Status New   Target Date 05/28/17                Plan - 04/16/17 1218    Clinical Impression Statement Pt is a 46 y/o female who presents to [facility] for Lt shoulder pain x 2-3 months; likely due to over use from crutches following a foot fx.  Pt presents today with minimal ROM limitations, but demonstrates pain, weakness and postural abnormalities.  Pt will benefit from PT to address deficits.  Initiated treatment today for posture and ionto for pain.     History and Personal Factors relevant to plan of care: Rt foot fx   Clinical Presentation Stable   Clinical Decision Making Low   Rehab Potential Good   PT Frequency 2x / week  may decrease to 1x/wk pending progress and pain   PT Duration 6 weeks   PT Treatment/Interventions ADLs/Self Care Home Management;Cryotherapy;Electrical Stimulation;Iontophoresis 4mg /ml Dexamethasone;Moist Heat;Ultrasound;Therapeutic exercise;Therapeutic activities;Patient/family education;Manual  techniques;Vasopneumatic Device;Taping;Dry needling   PT Next Visit Plan review HEP, manual/modalities for pain; gentle strengthening as tolerated   Consulted and Agree with Plan of Care Patient      Patient will benefit from skilled therapeutic intervention in order to improve the following deficits and impairments:  Decreased range of motion, Increased fascial restricitons, Decreased strength, Increased muscle spasms, Postural dysfunction, Pain, Impaired UE functional use  Visit Diagnosis: Acute pain of left shoulder - Plan: PT plan of care cert/re-cert  Stiffness of left shoulder, not elsewhere classified - Plan: PT plan of care cert/re-cert  Abnormal posture - Plan: PT plan of care cert/re-cert     Problem List Patient Active Problem List   Diagnosis Date Noted  . Acute shoulder bursitis, left 04/03/2017  . Overweight (BMI 25.0-29.9) 02/01/2017  . Closed nondisplaced fracture of proximal phalanx of right great toe with routine healing 01/30/2017  . Influenza-like illness 08/29/2016  . Generalized anxiety disorder 08/09/2016  . Low back pain without sciatica 08/08/2016  . Right trigger finger 05/05/2016  . Right hand pain 05/05/2016  . Anemia 10/26/2015  . Metrorrhagia 09/18/2015  . Ovarian cyst, right 09/14/2015  . Tension headache 08/08/2014  . Primary osteoarthritis of right hand 02/02/2014  . Aspiration into lower respiratory tract 05/02/2013  . Insomnia 12/24/2012  . Depression 12/24/2012  . Migraine 12/24/2012  . Hyperlipidemia 12/24/2012  . Vitamin D deficiency 12/24/2012  . Essential hypertension, benign 12/24/2012  . Anxiety 12/24/2012  . S/P gastric bypass 12/24/2012  . Increased PTH level 12/24/2012  . GERD (gastroesophageal reflux disease) 12/24/2012      Laureen Abrahams, PT, DPT 04/16/17 12:28 PM    [facility]  [place], [place], [address] Phone: [phone]   Fax:   [phone]  Name: Oneda Duffett MRN: [id number] Date of Birth: 1971/07/27

## 2017-04-16 NOTE — Patient Instructions (Addendum)
IONTOPHORESIS PATIENT PRECAUTIONS & CONTRAINDICATIONS:  . Redness under one or both electrodes can occur.  This characterized by a uniform redness that usually disappears within 12 hours of treatment. . Small pinhead size blisters may result in response to the drug.  Contact your physician if the problem persists more than 24 hours. . On rare occasions, iontophoresis therapy can result in temporary skin reactions such as rash, inflammation, irritation or burns.  The skin reactions may be the result of individual sensitivity to the ionic solution used, the condition of the skin at the start of treatment, reaction to the materials in the electrodes, allergies or sensitivity to dexamethasone, or a poor connection between the patch and your skin.  Discontinue using iontophoresis if you have any of these reactions and report to your therapist. . Remove the Patch or electrodes if you have any undue sensation of pain or burning during the treatment and report discomfort to your therapist. . Tell your Therapist if you have had known adverse reactions to the application of electrical current. . If using the Patch, the LED light will turn off when treatment is complete and the patch can be removed.  Approximate treatment time is 1-3 hours.  Remove the patch when light goes off or after 6 hours. . The Patch can be worn during normal activity, however excessive motion where the electrodes have been placed can cause poor contact between the skin and the electrode or uneven electrical current resulting in greater risk of skin irritation. [name] Keep out of the reach of children.   . DO NOT use if you have a cardiac pacemaker or any other electrically sensitive implanted device. . DO NOT use if you have a known sensitivity to dexamethasone. . DO NOT use during Magnetic Resonance Imaging (MRI). . DO NOT use over broken or compromised skin (e.g. sunburn, cuts, or acne) due to the increased risk of skin reaction. . DO  NOT SHAVE over the area to be treated:  To establish good contact between the Patch and the skin, excessive hair may be clipped. . DO NOT place the Patch or electrodes on or over your eyes, directly over your heart, or brain. . DO NOT reuse the Patch or electrodes as this may cause burns to occur.   Healthy Back - Shoulder Roll    Stand straight with arms relaxed at sides. Roll shoulders backward continuously. Do __10-15__ times.   Shoulder Blade Squeeze    Rotate shoulders back, then squeeze shoulder blades together. Repeat __10-15__ times. Do __2-3__ sessions per day.  Scapula Adduction With Pectoralis Stretch: Low - Standing   Shoulders at 45 hands even with shoulders, keeping weight through legs, shift weight forward until you feel pull or stretch through the front of your chest. Hold _30__ seconds. Do _3__ times, _2-4__ times per day.

## 2017-04-20 ENCOUNTER — Other Ambulatory Visit: Payer: Self-pay | Admitting: Physician Assistant

## 2017-04-20 DIAGNOSIS — I10 Essential (primary) hypertension: Secondary | ICD-10-CM

## 2017-04-21 ENCOUNTER — Ambulatory Visit (INDEPENDENT_AMBULATORY_CARE_PROVIDER_SITE_OTHER): Payer: BLUE CROSS/BLUE SHIELD

## 2017-04-21 DIAGNOSIS — W19XXXD Unspecified fall, subsequent encounter: Secondary | ICD-10-CM | POA: Diagnosis not present

## 2017-04-21 DIAGNOSIS — S62617A Displaced fracture of proximal phalanx of left little finger, initial encounter for closed fracture: Secondary | ICD-10-CM | POA: Diagnosis not present

## 2017-04-21 DIAGNOSIS — S92414G Nondisplaced fracture of proximal phalanx of right great toe, subsequent encounter for fracture with delayed healing: Secondary | ICD-10-CM

## 2017-04-21 DIAGNOSIS — S92414D Nondisplaced fracture of proximal phalanx of right great toe, subsequent encounter for fracture with routine healing: Secondary | ICD-10-CM

## 2017-04-22 ENCOUNTER — Encounter: Payer: BLUE CROSS/BLUE SHIELD | Admitting: Rehabilitative and Restorative Service Providers"

## 2017-04-28 ENCOUNTER — Telehealth: Payer: Self-pay | Admitting: Physician Assistant

## 2017-04-28 DIAGNOSIS — G8929 Other chronic pain: Secondary | ICD-10-CM

## 2017-04-28 NOTE — Telephone Encounter (Signed)
Patient called advised that she left a voice message for a prescription refill for Hydrocodone and know one has called her back. Please adv

## 2017-04-29 ENCOUNTER — Encounter: Payer: Self-pay | Admitting: Rehabilitative and Restorative Service Providers"

## 2017-04-29 ENCOUNTER — Ambulatory Visit (INDEPENDENT_AMBULATORY_CARE_PROVIDER_SITE_OTHER): Payer: BLUE CROSS/BLUE SHIELD | Admitting: Rehabilitative and Restorative Service Providers"

## 2017-04-29 DIAGNOSIS — M25612 Stiffness of left shoulder, not elsewhere classified: Secondary | ICD-10-CM | POA: Diagnosis not present

## 2017-04-29 DIAGNOSIS — R293 Abnormal posture: Secondary | ICD-10-CM

## 2017-04-29 DIAGNOSIS — M25512 Pain in left shoulder: Secondary | ICD-10-CM | POA: Diagnosis not present

## 2017-04-29 MED ORDER — HYDROCODONE-ACETAMINOPHEN 5-325 MG PO TABS: 1.0000 | ORAL_TABLET | Freq: Three times a day (TID) | ORAL | 0 refills | Status: DC | PRN

## 2017-04-29 NOTE — Telephone Encounter (Signed)
Rx printed, patient is aware.

## 2017-04-29 NOTE — Patient Instructions (Addendum)
Massage tight muscles in the neck from the collar bone up behind the ear and the area of the upper trap on the back of the shoulder area  Shoulder Blade Squeeze    Rotate shoulders back, then squeeze shoulder blades down and back . Hold 10 sec Repeat _10___ times. Do _several___ sessions per day. Can use swim noodle along spine for tactile cue   ELBOW: Biceps - Standing    Standing in doorway, place one hand on wall, elbow straight. Step forward. Hold _30__ seconds. _3__ reps per set 2-3 times a day   TENS UNIT: This is helpful for muscle pain and spasm.   Search and Purchase a TENS 7000 2nd edition at www.tenspros.com. It should be less than $30.     TENS unit instructions: Do not shower or bathe with the unit on Turn the unit off before removing electrodes or batteries If the electrodes lose stickiness add a drop of water to the electrodes after they are disconnected from the unit and place on plastic sheet. If you continued to have difficulty, call the TENS unit company to purchase more electrodes. Do not apply lotion on the skin area prior to use. Make sure the skin is clean and dry as this will help prolong the life of the electrodes. After use, always check skin for unusual red areas, rash or other skin difficulties. If there are any skin problems, does not apply electrodes to the same area. Never remove the electrodes from the unit by pulling the wires. Do not use the TENS unit or electrodes other than as directed. Do not change electrode placement without consultating your therapist or physician. Keep 2 fingers with between each electrode.   Trigger Point Dry Needling  . What is Trigger Point Dry Needling (DN)? o DN is a physical therapy technique used to treat muscle pain and dysfunction. Specifically, DN helps deactivate muscle trigger points (muscle knots).  o A thin filiform needle is used to penetrate the skin and stimulate the underlying trigger point. The goal  is for a local twitch response (LTR) to occur and for the trigger point to relax. No medication of any kind is injected during the procedure.   . What Does Trigger Point Dry Needling Feel Like?  o The procedure feels different for each individual patient. Some patients report that they do not actually feel the needle enter the skin and overall the process is not painful. Very mild bleeding may occur. However, many patients feel a deep cramping in the muscle in which the needle was inserted. This is the local twitch response.   [name] How Will I feel after the treatment? o Soreness is normal, and the onset of soreness may not occur for a few hours. Typically this soreness does not last longer than two days.  o Bruising is uncommon, however; ice can be used to decrease any possible bruising.  o In rare cases feeling tired or nauseous after the treatment is normal. In addition, your symptoms may get worse before they get better, this period will typically not last longer than 24 hours.   . What Can I do After My Treatment? o Increase your hydration by drinking more water for the next 24 hours. o You may place ice or heat on the areas treated that have become sore, however, do not use heat on inflamed or bruised areas. Heat often brings more relief post needling. o You can continue your regular activities, but vigorous activity is not recommended initially  after the treatment for 24 hours. o DN is best combined with other physical therapy such as strengthening, stretching, and other therapies.

## 2017-04-29 NOTE — Therapy (Signed)
[place], [place], [address] Phone: [phone]   Fax:  [phone]  Physical Therapy Treatment  Patient Details  Name: Kimberly Wade MRN: [id number] Date of Birth: 1970-09-26 Referring Provider: Dr Dianah Field   Encounter Date: 04/29/2017      PT End of Session - 04/29/17 1556    Visit Number 2   Number of Visits 12   Date for PT Re-Evaluation 05/28/17   Authorization Type BCBS   PT Start Time 1556   PT Stop Time 1650   PT Time Calculation (min) 54 min   Activity Tolerance Patient tolerated treatment well      Past Medical History:  Diagnosis Date  . Anxiety   . Diabetes ([place])    States after gastric Bypass Not a diabetic now  . GERD (gastroesophageal reflux disease)   . Hyperlipidemia   . Hypertension   . Insomnia   . Pneumococcal pneumonia ([place]) 2000  . Pneumonia     Past Surgical History:  Procedure Laterality Date  . CESAREAN SECTION     x4  . CHEST TUBE INSERTION    . chest tubes  2000  . dysplastic mole removed     right ankle  . GASTRIC BYPASS    . INTRAUTERINE DEVICE (IUD) INSERTION N/A 07/11/2014   Procedure: INTRAUTERINE DEVICE (IUD) INSERTION;  Surgeon: Emily Filbert, MD;  Location: [facility];  Service: Gynecology;  Laterality: N/A;  . IUD REMOVAL N/A 07/11/2014   Procedure: INTRAUTERINE DEVICE (IUD) REMOVAL;  Surgeon: Emily Filbert, MD;  Location: [facility];  Service: Gynecology;  Laterality: N/A;  . mini gastric bypass  2011  . recoonstruction of right lung    . VIDEO BRONCHOSCOPY Bilateral 04/20/2013   Procedure: VIDEO BRONCHOSCOPY WITH FLUORO;  Surgeon: Elsie Stain, MD;  Location: [facility];  Service: Cardiopulmonary;  Laterality: Bilateral;    There were no vitals filed for this visit.      Subjective Assessment - 04/29/17 1557    Subjective Patient reports that she has done the exercises but does not feel the shoulder is any better.    Currently in  Pain? Yes   Pain Score 4    Pain Location Shoulder   Pain Orientation Left;Anterior;Lateral   Pain Descriptors / Indicators Spasm;Cramping   Pain Type Acute pain   Pain Onset More than a month ago   Pain Frequency Intermittent            [facility] PT Assessment - 04/29/17 0001      Assessment   Medical Diagnosis Lt shoulder pain   Referring Provider Dr Dianah Field    Onset Date/Surgical Date --  2-3 months ago   Hand Dominance Right   Next MD Visit 05/07/17   Prior Therapy none     AROM   Left Shoulder Flexion 150 Degrees   Left Shoulder ABduction 162 Degrees   Left Shoulder Internal Rotation 35 Degrees   Left Shoulder External Rotation 82 Degrees     Palpation   Palpation comment tenderness and tightness Lt SCM; upper trap; leveator; pecs; proximal biceps tendon                     [facility] Adult PT Treatment/Exercise - 04/29/17 0001      Neuro Re-ed    Neuro Re-ed Details  initiated work on posture and alignment engaging posterior shoulder girdle musculature      Shoulder Exercises: Standing   Other Standing Exercises  scap squeeze with noodle 10 sec x 10; axial extension 5 sec x 3 (no stretch or tightness with chin tuck)      Shoulder Exercises: Stretch   Other Shoulder Stretches low doorway stretch x30 sec - for HEP instruction   Other Shoulder Stretches biceps stretch 30 sec x 3      Moist Heat Therapy   Number Minutes Moist Heat 16 Minutes   Moist Heat Location Shoulder;Cervical  Lt shd     Electrical Stimulation   Electrical Stimulation Location Lt SCM; Lt anterior shd to biceps    Electrical Stimulation Action IFC   Electrical Stimulation Parameters to tolerance   Electrical Stimulation Goals Tone;Pain     Iontophoresis   Type of Iontophoresis Dexamethasone   Location Lt shoulder; biceps tendon   Dose 1.0 cc; 78mAmp dose    Time 6 hour patch     Manual Therapy   Manual therapy comments pt supine    Soft tissue mobilization working through  the [employer] and upper trap/scapula area; Lt anterior chest/pecs to anterior shoudler/biceps    Myofascial Release anterior shoudler                 PT Education - 04/29/17 1638    Education provided Yes   Education Details HEP TENS DN    Person(s) Educated Patient   Methods Explanation;Demonstration;Tactile cues;Verbal cues;Handout   Comprehension Verbalized understanding;Returned demonstration;Verbal cues required;Tactile cues required             PT Long Term Goals - 04/29/17 1656      PT LONG TERM GOAL #1   Title independent with HEP   Time 6   Period Weeks   Status On-going     PT LONG TERM GOAL #2   Title verbalize understanding of posture/body mechanics to decrease risk of reinjury   Time 6   Period Weeks   Status On-going     PT LONG TERM GOAL #3   Title perform Lt shoulder ROM without pain for improved function and pain   Time 6   Period Weeks   Status On-going     PT LONG TERM GOAL #4   Title report 75% improvement in UB dressing for improved function   Time 6   Period Weeks   Status On-going     PT LONG TERM GOAL #5   Title demonstrate at least 4/5 Lt shoulder strength in order to return to overhead lifting and regular activities   Time 6   Period Weeks   Status On-going               Plan - 04/29/17 1646    Clinical Impression Statement No significant change in symtpoms following initial visit with HEP. Patient has poor posture and scapular dyskinesis. she has muscular tightness through the SCM and upper traps as well sa pecs and biceps. Tolerated exercise well and min/moderate pressure with manual work. Feels the ionto was helpful which was repeated today. No goals accomplished. Continue to work toward rehab goals with focus on posture and alignment improving posterior shoulder girdle control and addressing muscular tightness noted.    Rehab Potential Good   PT Frequency 2x / week   PT Duration 6 weeks   PT Treatment/Interventions  ADLs/Self Care Home Management;Cryotherapy;Electrical Stimulation;Iontophoresis 4mg /ml Dexamethasone;Moist Heat;Ultrasound;Therapeutic exercise;Therapeutic activities;Patient/family education;Manual techniques;Vasopneumatic Device;Taping;Dry needling   PT Next Visit Plan review HEP, progress with pec stretching; continue manual work through anterior cervical and shoulder musculature; modalities for pain; gentle  strengthening as tolerated. consider DN    Consulted and Agree with Plan of Care Patient      Patient will benefit from skilled therapeutic intervention in order to improve the following deficits and impairments:  Decreased range of motion, Increased fascial restricitons, Decreased strength, Increased muscle spasms, Postural dysfunction, Pain, Impaired UE functional use  Visit Diagnosis: Acute pain of left shoulder  Stiffness of left shoulder, not elsewhere classified  Abnormal posture     Problem List Patient Active Problem List   Diagnosis Date Noted  . Acute shoulder bursitis, left 04/03/2017  . Overweight (BMI 25.0-29.9) 02/01/2017  . Closed nondisplaced fracture of proximal phalanx of right great toe with routine healing 01/30/2017  . Influenza-like illness 08/29/2016  . Generalized anxiety disorder 08/09/2016  . Low back pain without sciatica 08/08/2016  . Right trigger finger 05/05/2016  . Right hand pain 05/05/2016  . Anemia 10/26/2015  . Metrorrhagia 09/18/2015  . Ovarian cyst, right 09/14/2015  . Tension headache 08/08/2014  . Primary osteoarthritis of right hand 02/02/2014  . Aspiration into lower respiratory tract 05/02/2013  . Insomnia 12/24/2012  . Depression 12/24/2012  . Migraine 12/24/2012  . Hyperlipidemia 12/24/2012  . Vitamin D deficiency 12/24/2012  . Essential hypertension, benign 12/24/2012  . Anxiety 12/24/2012  . S/P gastric bypass 12/24/2012  . Increased PTH level 12/24/2012  . GERD (gastroesophageal reflux disease) 12/24/2012    Ladasha Schnackenberg Nilda Simmer PT, MPH  04/29/2017, 5:04 PM  [facility] [place], [place], [address] Phone: [phone]   Fax:  [phone]  Name: Tristen Pennino MRN: [id number] Date of Birth: Oct 29, 1970

## 2017-05-01 ENCOUNTER — Ambulatory Visit (INDEPENDENT_AMBULATORY_CARE_PROVIDER_SITE_OTHER): Payer: BLUE CROSS/BLUE SHIELD | Admitting: Rehabilitative and Restorative Service Providers"

## 2017-05-01 ENCOUNTER — Encounter: Payer: Self-pay | Admitting: Rehabilitative and Restorative Service Providers"

## 2017-05-01 DIAGNOSIS — M25612 Stiffness of left shoulder, not elsewhere classified: Secondary | ICD-10-CM

## 2017-05-01 DIAGNOSIS — R293 Abnormal posture: Secondary | ICD-10-CM | POA: Diagnosis not present

## 2017-05-01 DIAGNOSIS — M25512 Pain in left shoulder: Secondary | ICD-10-CM

## 2017-05-01 NOTE — Therapy (Addendum)
[facility] [address] [place], [place], [address] Phone: [phone]   Fax:  [phone]  Physical Therapy Treatment/Discharge  Patient Details  Name: Kimberly Wade MRN: [id number] Date of Birth: 05/28/1971 Referring Provider: Dr Thekkekandam   Encounter Date: 05/01/2017      PT End of Session - 05/01/17 1529    Visit Number 3   Number of Visits 12   Date for PT Re-Evaluation 05/28/17   PT Start Time 1529   PT Stop Time 1617   PT Time Calculation (min) 48 min   Activity Tolerance Patient tolerated treatment well      Past Medical History:  Diagnosis Date  . Anxiety   . Diabetes (HCC)    States after gastric Bypass Not a diabetic now  . GERD (gastroesophageal reflux disease)   . Hyperlipidemia   . Hypertension   . Insomnia   . Pneumococcal pneumonia (HCC) 2000  . Pneumonia     Past Surgical History:  Procedure Laterality Date  . CESAREAN SECTION     x4  . CHEST TUBE INSERTION    . chest tubes  2000  . dysplastic mole removed     right ankle  . GASTRIC BYPASS    . INTRAUTERINE DEVICE (IUD) INSERTION N/A 07/11/2014   Procedure: INTRAUTERINE DEVICE (IUD) INSERTION;  Surgeon: Myra C Dove, MD;  Location: [facility];  Service: Gynecology;  Laterality: N/A;  . IUD REMOVAL N/A 07/11/2014   Procedure: INTRAUTERINE DEVICE (IUD) REMOVAL;  Surgeon: Myra C Dove, MD;  Location: [facility];  Service: Gynecology;  Laterality: N/A;  . mini gastric bypass  2011  . recoonstruction of right lung    . VIDEO BRONCHOSCOPY Bilateral 04/20/2013   Procedure: VIDEO BRONCHOSCOPY WITH FLUORO;  Surgeon: Patrick E Wright, MD;  Location: [facility];  Service: Cardiopulmonary;  Laterality: Bilateral;    There were no vitals filed for this visit.      Subjective Assessment - 05/01/17 1534    Subjective Kimberly Wade reports that she had significant increase in pain last night before she went to bed. Patient does not know of anything she  did that would irritate the shoulder. The shoudler has continued to hurt all day today. Does remember that she did work longer hours at her computer yesterday. Did not have increase in pain until she finished work and was getting ready for bed.    Currently in Pain? Yes   Pain Score 8    Pain Location Shoulder   Pain Orientation Left;Anterior;Lateral   Pain Descriptors / Indicators Spasm;Cramping   Pain Type Acute pain   Pain Onset More than a month ago            [facility] PT Assessment - 05/01/17 0001      Assessment   Medical Diagnosis Lt shoulder pain   Referring Provider Dr Thekkekandam    Onset Date/Surgical Date --  2-3 months ago   Hand Dominance Right   Next MD Visit 05/07/17   Prior Therapy none     Posture/Postural Control   Posture Comments holds Lt UE in adducted posture with elbow flexed arm along side      AROM   Overall AROM  --  pain with AROM Lt shd elevation      Palpation   Palpation comment tenderness and tightness Lt SCM; upper trap; leveator; pecs; proximal biceps tendon                       [facility] Adult PT Treatment/Exercise - 05/01/17 0001      Shoulder Exercises: Pulleys   Flexion --  10 sec x 10    Flexion Limitations good increase in AAROM w/ shd flexion      Moist Heat Therapy   Number Minutes Moist Heat 17 Minutes   Moist Heat Location Shoulder;Cervical  Lt shd     Electrical Stimulation   Electrical Stimulation Location Lt anterior shoulder   Electrical Stimulation Action IFC   Electrical Stimulation Parameters to tolerance   Electrical Stimulation Goals Tone;Pain     Iontophoresis   Type of Iontophoresis Dexamethasone   Location Lt shoulder; biceps tendon    Dose 1.0 cc; 45mmp dose    Time 6 hour patch     Manual Therapy   Manual therapy comments pt supine    Soft tissue mobilization working through the [employer] upper trap/scapula area; Lt anterior chest/pecs to anterior shoudler/biceps - focus on anterior  shoulder/biceps area    Myofascial Release anterior shoudler                      PT Long Term Goals - 04/29/17 1656      PT LONG TERM GOAL #1   Title independent with HEP   Time 6   Period Weeks   Status On-going     PT LONG TERM GOAL #2   Title verbalize understanding of posture/body mechanics to decrease risk of reinjury   Time 6   Period Weeks   Status On-going     PT LONG TERM GOAL #3   Title perform Lt shoulder ROM without pain for improved function and pain   Time 6   Period Weeks   Status On-going     PT LONG TERM GOAL #4   Title report 75% improvement in UB dressing for improved function   Time 6   Period Weeks   Status On-going     PT LONG TERM GOAL #5   Title demonstrate at least 4/5 Lt shoulder strength in order to return to overhead lifting and regular activities   Time 6   Period Weeks   Status On-going               Plan - 05/01/17 1609    Clinical Impression Statement Some improvement in symptoms following treatment Wednesday but significant increase in pain last night after working extra hours - as she was getting ready for bed. Pain has not resopnded to heat or medication at home. Flare up likely related to increse in demand for shoulder girdle musculature with work. Kimberly Wade continued increse in muscular tightness through anterior Lt shoulder today, unable to tolerate exercise. Did well with gentle to moderate pressure for manual work and modalities.    Rehab Potential Good   PT Frequency 2x / week   PT Duration 6 weeks   PT Treatment/Interventions ADLs/Self Care Home Management;Cryotherapy;Electrical Stimulation;Iontophoresis 436mml Dexamethasone;Moist Heat;Ultrasound;Therapeutic exercise;Therapeutic activities;Patient/family education;Manual techniques;Vasopneumatic Device;Taping;Dry needling   PT Next Visit Plan assess pain level and response to intervention with today's treatment; review HEP, progress with pec stretching;  continue manual work through anterior cervical and shoulder musculature; modalities for pain; gentle strengthening as tolerated. consider DN    Consulted and Agree with Plan of Care Patient      Patient will benefit from skilled therapeutic intervention in order to improve the following deficits and impairments:  Decreased range of motion, Increased fascial restricitons, Decreased strength, Increased muscle spasms, Postural  dysfunction, Pain, Impaired UE functional use  Visit Diagnosis: Acute pain of left shoulder  Stiffness of left shoulder, not elsewhere classified  Abnormal posture     Problem List Patient Active Problem List   Diagnosis Date Noted  . Acute shoulder bursitis, left 04/03/2017  . Overweight (BMI 25.0-29.9) 02/01/2017  . Closed nondisplaced fracture of proximal phalanx of right great toe with routine healing 01/30/2017  . Influenza-like illness 08/29/2016  . Generalized anxiety disorder 08/09/2016  . Low back pain without sciatica 08/08/2016  . Right trigger finger 05/05/2016  . Right hand pain 05/05/2016  . Anemia 10/26/2015  . Metrorrhagia 09/18/2015  . Ovarian cyst, right 09/14/2015  . Tension headache 08/08/2014  . Primary osteoarthritis of right hand 02/02/2014  . Aspiration into lower respiratory tract 05/02/2013  . Insomnia 12/24/2012  . Depression 12/24/2012  . Migraine 12/24/2012  . Hyperlipidemia 12/24/2012  . Vitamin D deficiency 12/24/2012  . Essential hypertension, benign 12/24/2012  . Anxiety 12/24/2012  . S/P gastric bypass 12/24/2012  . Increased PTH level 12/24/2012  . GERD (gastroesophageal reflux disease) 12/24/2012     P  PT, MPH  05/01/2017, 4:14 PM  [facility] [address] [place], [place], [address] Phone: [phone]   Fax:  [phone]  Name: Kimberly Wade MRN: [id number] Date of Birth: 10/08/1970        PHYSICAL THERAPY DISCHARGE  SUMMARY  Visits from Start of Care: 3  Current functional level related to goals / functional outcomes: See above   Remaining deficits: Unknown; pt d/c'ed as she had surgery   Education / Equipment: HEP  Plan: Patient agrees to discharge.  Patient goals were not met. Patient is being discharged due to a change in medical status.  ?????     Stephanie F Matthews, PT, DPT 06/03/17 12:54 PM  [facility] [address] [place], [place] [address]  [phone] (office) [phone] (fax)  

## 2017-05-04 ENCOUNTER — Encounter: Payer: BLUE CROSS/BLUE SHIELD | Admitting: Physical Therapy

## 2017-05-07 ENCOUNTER — Encounter: Payer: BLUE CROSS/BLUE SHIELD | Admitting: Rehabilitative and Restorative Service Providers"

## 2017-05-07 ENCOUNTER — Ambulatory Visit (INDEPENDENT_AMBULATORY_CARE_PROVIDER_SITE_OTHER): Payer: BLUE CROSS/BLUE SHIELD | Admitting: Sports Medicine

## 2017-05-07 DIAGNOSIS — M25512 Pain in left shoulder: Secondary | ICD-10-CM

## 2017-05-07 DIAGNOSIS — S92414K Nondisplaced fracture of proximal phalanx of right great toe, subsequent encounter for fracture with nonunion: Secondary | ICD-10-CM

## 2017-05-07 DIAGNOSIS — G8929 Other chronic pain: Secondary | ICD-10-CM

## 2017-05-07 DIAGNOSIS — M7552 Bursitis of left shoulder: Secondary | ICD-10-CM | POA: Diagnosis not present

## 2017-05-07 MED ORDER — HYDROCODONE-ACETAMINOPHEN 5-325 MG PO TABS: 1.0000 | ORAL_TABLET | Freq: Four times a day (QID) | ORAL | 0 refills | Status: DC | PRN

## 2017-05-07 NOTE — Assessment & Plan Note (Signed)
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 Referral to podiatry for consideration of ORIF. I'm going to give her some more pain medication, she is typically on a pain contract with [employer], PA-C, it seems as though she's needing 3 pills per day, #45 per month is not sufficient.

## 2017-05-07 NOTE — Assessment & Plan Note (Signed)
Persistent impingement type symptoms after aggressive physical therapy, as well as an ultrasound-guided subacromial injection as well as biceps sheath injection. MRI, I would also like her to touch base with Dr. Griffin Basil, she is now a candidate for shoulder arthroscopy.

## 2017-05-07 NOTE — Progress Notes (Signed)
  Subjective:    CC: Follow-up  HPI: This is a pleasant 46 year old female, she has known left shoulder subacromial bursitis, we've done several injections, subacromial with ultrasound guidance as well as into the bicipital tendon sheath with ultrasound guidance, she's done well temporarily, unfortunately has persistent symptoms, she is also given aggressive formal physical therapy without much improvement.  Toe fracture: Occurred 4 months ago, she ended up with a nondisplaced fracture to the proximal phalanx of the right great toe, she has been immobilized but unfortunately has had persistent pain. 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 Persistent pain. She did have some swelling and erythema which resolved with doxycycline.  Past medical history:  Negative.  See flowsheet/record as well for more information.  Surgical history: Negative.  See flowsheet/record as well for more information.  Family history: Negative.  See flowsheet/record as well for more information.  Social history: Negative.  See flowsheet/record as well for more information.  Allergies, and medications have been entered into the medical record, reviewed, and no changes needed.   Review of Systems: No fevers, chills, night sweats, weight loss, chest pain, or shortness of breath.   Objective:    General: Well Developed, well nourished, and in no acute distress.  Neuro: Alert and oriented x3, extra-ocular muscles intact, sensation grossly intact.  HEENT: Normocephalic, atraumatic, pupils equal round reactive to light, neck supple, no masses, no lymphadenopathy, thyroid nonpalpable.  Skin: Warm and dry, no rashes. Cardiac: Regular rate and rhythm, no murmurs rubs or gallops, no lower extremity edema.  Respiratory: Clear to auscultation bilaterally. Not using accessory muscles, speaking in full sentences. Left Shoulder: Inspection reveals no abnormalities, atrophy or asymmetry. Palpation is normal with no  tenderness over AC joint or bicipital groove. ROM is full in all planes. Rotator cuff strength normal throughout. Positive Neer and Hawkin's tests, empty can. Speeds and Yergason's tests normal. No labral pathology noted with negative Obrien's, negative crank, negative clunk, and good stability. Normal scapular function observed. No painful arc and no drop arm sign. No apprehension sign  Impression and Recommendations:    Acute shoulder bursitis, left Persistent impingement type symptoms after aggressive physical therapy, as well as an ultrasound-guided subacromial injection as well as biceps sheath injection. MRI, I would also like her to touch base with Dr. Griffin Basil, she is now a candidate for shoulder arthroscopy.  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 Referral to podiatry for consideration of ORIF. I'm going to give her some more pain medication, she is typically on a pain contract with [employer], PA-C, it seems as though she's needing 3 pills per day, #45 per month is not sufficient.  I spent 25 minutes with this patient, greater than 50% was face-to-face time counseling regarding the above diagnoses ___________________________________________ Gwen Her. Dianah Field, M.D., ABFM., CAQSM. Primary Care and [place] Instructor of [employer] [facility] [employer]

## 2017-05-11 ENCOUNTER — Ambulatory Visit (INDEPENDENT_AMBULATORY_CARE_PROVIDER_SITE_OTHER): Payer: BLUE CROSS/BLUE SHIELD

## 2017-05-11 DIAGNOSIS — M7552 Bursitis of left shoulder: Secondary | ICD-10-CM

## 2017-05-11 DIAGNOSIS — M75122 Complete rotator cuff tear or rupture of left shoulder, not specified as traumatic: Secondary | ICD-10-CM | POA: Diagnosis not present

## 2017-05-11 DIAGNOSIS — M12812 Other specific arthropathies, not elsewhere classified, left shoulder: Secondary | ICD-10-CM

## 2017-05-11 DIAGNOSIS — M75102 Unspecified rotator cuff tear or rupture of left shoulder, not specified as traumatic: Secondary | ICD-10-CM | POA: Diagnosis not present

## 2017-05-11 DIAGNOSIS — M25512 Pain in left shoulder: Secondary | ICD-10-CM

## 2017-05-11 DIAGNOSIS — G8929 Other chronic pain: Secondary | ICD-10-CM

## 2017-05-14 ENCOUNTER — Encounter: Payer: BLUE CROSS/BLUE SHIELD | Admitting: Physical Therapy

## 2017-05-14 ENCOUNTER — Other Ambulatory Visit: Payer: Self-pay | Admitting: *Deleted

## 2017-05-14 DIAGNOSIS — I10 Essential (primary) hypertension: Secondary | ICD-10-CM

## 2017-05-14 DIAGNOSIS — M25512 Pain in left shoulder: Secondary | ICD-10-CM | POA: Diagnosis not present

## 2017-05-14 MED ORDER — LISINOPRIL-HYDROCHLOROTHIAZIDE 20-12.5 MG PO TABS: 1.0000 | ORAL_TABLET | Freq: Every day | ORAL | 3 refills | Status: DC

## 2017-05-15 ENCOUNTER — Encounter (HOSPITAL_BASED_OUTPATIENT_CLINIC_OR_DEPARTMENT_OTHER): Payer: Self-pay | Admitting: *Deleted

## 2017-05-18 ENCOUNTER — Ambulatory Visit: Payer: Self-pay | Admitting: Podiatry

## 2017-05-19 ENCOUNTER — Encounter (HOSPITAL_BASED_OUTPATIENT_CLINIC_OR_DEPARTMENT_OTHER)
Admission: RE | Admit: 2017-05-19 | Discharge: 2017-05-19 | Disposition: A | Discharge status: Home / Self Care | Payer: BLUE CROSS/BLUE SHIELD | Source: Ambulatory Visit | Attending: Orthopaedic Surgery | Admitting: Orthopaedic Surgery

## 2017-05-19 DIAGNOSIS — G8929 Other chronic pain: Secondary | ICD-10-CM | POA: Diagnosis not present

## 2017-05-19 DIAGNOSIS — I10 Essential (primary) hypertension: Secondary | ICD-10-CM | POA: Diagnosis not present

## 2017-05-19 DIAGNOSIS — G4700 Insomnia, unspecified: Secondary | ICD-10-CM | POA: Diagnosis not present

## 2017-05-19 DIAGNOSIS — K219 Gastro-esophageal reflux disease without esophagitis: Secondary | ICD-10-CM | POA: Diagnosis not present

## 2017-05-19 DIAGNOSIS — Z880 Allergy status to penicillin: Secondary | ICD-10-CM | POA: Diagnosis not present

## 2017-05-19 DIAGNOSIS — E785 Hyperlipidemia, unspecified: Secondary | ICD-10-CM | POA: Diagnosis not present

## 2017-05-19 DIAGNOSIS — Z9884 Bariatric surgery status: Secondary | ICD-10-CM | POA: Diagnosis not present

## 2017-05-19 DIAGNOSIS — F419 Anxiety disorder, unspecified: Secondary | ICD-10-CM | POA: Diagnosis not present

## 2017-05-19 DIAGNOSIS — Z79899 Other long term (current) drug therapy: Secondary | ICD-10-CM | POA: Diagnosis not present

## 2017-05-19 DIAGNOSIS — M75102 Unspecified rotator cuff tear or rupture of left shoulder, not specified as traumatic: Secondary | ICD-10-CM | POA: Diagnosis not present

## 2017-05-19 DIAGNOSIS — M19012 Primary osteoarthritis, left shoulder: Secondary | ICD-10-CM | POA: Diagnosis not present

## 2017-05-19 DIAGNOSIS — M7522 Bicipital tendinitis, left shoulder: Secondary | ICD-10-CM | POA: Diagnosis not present

## 2017-05-19 LAB — BASIC METABOLIC PANEL
Anion gap: 5 (ref 5–15)
BUN: 10 mg/dL (ref 6–20)
CHLORIDE: 104 mmol/L (ref 101–111)
CO2: 30 mmol/L (ref 22–32)
CREATININE: 0.6 mg/dL (ref 0.44–1.00)
Calcium: 8.6 mg/dL — ABNORMAL LOW (ref 8.9–10.3)
GFR calc non Af Amer: >60 mL/min (ref >60)
Glucose, Bld: 97 mg/dL (ref 65–99)
POTASSIUM: 3.8 mmol/L (ref 3.5–5.1)
Sodium: 139 mmol/L (ref 135–145)

## 2017-05-20 ENCOUNTER — Ambulatory Visit (HOSPITAL_BASED_OUTPATIENT_CLINIC_OR_DEPARTMENT_OTHER): Payer: BLUE CROSS/BLUE SHIELD | Admitting: Anesthesiology

## 2017-05-20 ENCOUNTER — Encounter (HOSPITAL_BASED_OUTPATIENT_CLINIC_OR_DEPARTMENT_OTHER): Payer: Self-pay | Admitting: *Deleted

## 2017-05-20 ENCOUNTER — Encounter (HOSPITAL_BASED_OUTPATIENT_CLINIC_OR_DEPARTMENT_OTHER)
Admission: RE | Disposition: A | Discharge status: Home / Self Care | Payer: Self-pay | Source: Ambulatory Visit | Attending: Orthopaedic Surgery

## 2017-05-20 ENCOUNTER — Ambulatory Visit (HOSPITAL_BASED_OUTPATIENT_CLINIC_OR_DEPARTMENT_OTHER)
Admission: RE | Admit: 2017-05-20 | Discharge: 2017-05-20 | Disposition: A | Discharge status: Home / Self Care | Payer: BLUE CROSS/BLUE SHIELD | Source: Ambulatory Visit | Attending: Orthopaedic Surgery | Admitting: Orthopaedic Surgery

## 2017-05-20 DIAGNOSIS — Z79899 Other long term (current) drug therapy: Secondary | ICD-10-CM | POA: Insufficient documentation

## 2017-05-20 DIAGNOSIS — G4700 Insomnia, unspecified: Secondary | ICD-10-CM | POA: Insufficient documentation

## 2017-05-20 DIAGNOSIS — E785 Hyperlipidemia, unspecified: Secondary | ICD-10-CM | POA: Insufficient documentation

## 2017-05-20 DIAGNOSIS — G8929 Other chronic pain: Secondary | ICD-10-CM | POA: Insufficient documentation

## 2017-05-20 DIAGNOSIS — K219 Gastro-esophageal reflux disease without esophagitis: Secondary | ICD-10-CM | POA: Diagnosis not present

## 2017-05-20 DIAGNOSIS — M24112 Other articular cartilage disorders, left shoulder: Secondary | ICD-10-CM | POA: Diagnosis not present

## 2017-05-20 DIAGNOSIS — F419 Anxiety disorder, unspecified: Secondary | ICD-10-CM | POA: Diagnosis not present

## 2017-05-20 DIAGNOSIS — M19012 Primary osteoarthritis, left shoulder: Secondary | ICD-10-CM | POA: Insufficient documentation

## 2017-05-20 DIAGNOSIS — Z880 Allergy status to penicillin: Secondary | ICD-10-CM | POA: Diagnosis not present

## 2017-05-20 DIAGNOSIS — M7542 Impingement syndrome of left shoulder: Secondary | ICD-10-CM | POA: Diagnosis not present

## 2017-05-20 DIAGNOSIS — S46012A Strain of muscle(s) and tendon(s) of the rotator cuff of left shoulder, initial encounter: Secondary | ICD-10-CM | POA: Diagnosis not present

## 2017-05-20 DIAGNOSIS — M7522 Bicipital tendinitis, left shoulder: Secondary | ICD-10-CM | POA: Diagnosis not present

## 2017-05-20 DIAGNOSIS — I10 Essential (primary) hypertension: Secondary | ICD-10-CM | POA: Insufficient documentation

## 2017-05-20 DIAGNOSIS — Z9884 Bariatric surgery status: Secondary | ICD-10-CM | POA: Insufficient documentation

## 2017-05-20 DIAGNOSIS — M75102 Unspecified rotator cuff tear or rupture of left shoulder, not specified as traumatic: Secondary | ICD-10-CM | POA: Diagnosis not present

## 2017-05-20 DIAGNOSIS — G8918 Other acute postprocedural pain: Secondary | ICD-10-CM | POA: Diagnosis not present

## 2017-05-20 HISTORY — DX: Anemia, unspecified: D64.9

## 2017-05-20 HISTORY — DX: Other chronic pain: G89.29

## 2017-05-20 HISTORY — DX: Unspecified osteoarthritis, unspecified site: M19.90

## 2017-05-20 HISTORY — PX: SHOULDER ARTHROSCOPY WITH SUBACROMIAL DECOMPRESSION, ROTATOR CUFF REPAIR AND BICEP TENDON REPAIR: SHX5687

## 2017-05-20 SURGERY — SHOULDER ARTHROSCOPY WITH SUBACROMIAL DECOMPRESSION, ROTATOR CUFF REPAIR AND BICEP TENDON REPAIR
Anesthesia: General | Site: Shoulder | Laterality: Left

## 2017-05-20 MED ORDER — HYDROMORPHONE HCL 1 MG/ML IJ SOLN: 0.2500 mg | INTRAMUSCULAR | Status: DC | PRN

## 2017-05-20 MED ORDER — METOCLOPRAMIDE HCL 5 MG/ML IJ SOLN: 10.0000 mg | Freq: Once | INTRAMUSCULAR | Status: DC | PRN

## 2017-05-20 MED ORDER — PHENYLEPHRINE 40 MCG/ML (10ML) SYRINGE FOR IV PUSH (FOR BLOOD PRESSURE SUPPORT)
PREFILLED_SYRINGE | INTRAVENOUS | Status: AC
  Filled 2017-05-20: qty 10

## 2017-05-20 MED ORDER — BISACODYL 5 MG PO TBEC: 5.0000 mg | DELAYED_RELEASE_TABLET | Freq: Every day | ORAL | 0 refills | Status: AC | PRN

## 2017-05-20 MED ORDER — ONDANSETRON HCL 4 MG PO TABS: 4.0000 mg | ORAL_TABLET | Freq: Three times a day (TID) | ORAL | 1 refills | Status: AC | PRN

## 2017-05-20 MED ORDER — DEXAMETHASONE SODIUM PHOSPHATE 4 MG/ML IJ SOLN
INTRAMUSCULAR | Status: DC | PRN
  Administered 2017-05-20: 10 mg via INTRAVENOUS

## 2017-05-20 MED ORDER — LACTATED RINGERS IV SOLN
INTRAVENOUS | Status: DC
  Administered 2017-05-20 (×2): via INTRAVENOUS

## 2017-05-20 MED ORDER — PROPOFOL 10 MG/ML IV BOLUS
INTRAVENOUS | Status: DC | PRN
  Administered 2017-05-20: 150 mg via INTRAVENOUS

## 2017-05-20 MED ORDER — SUGAMMADEX SODIUM 200 MG/2ML IV SOLN
INTRAVENOUS | Status: AC
  Filled 2017-05-20: qty 2

## 2017-05-20 MED ORDER — LIDOCAINE 2% (20 MG/ML) 5 ML SYRINGE
INTRAMUSCULAR | Status: AC
  Filled 2017-05-20: qty 5

## 2017-05-20 MED ORDER — OXYCODONE HCL 5 MG PO TABS: ORAL_TABLET | ORAL | 0 refills | Status: AC

## 2017-05-20 MED ORDER — SCOPOLAMINE 1 MG/3DAYS TD PT72: 1.0000 | MEDICATED_PATCH | Freq: Once | TRANSDERMAL | Status: DC | PRN

## 2017-05-20 MED ORDER — SUCCINYLCHOLINE CHLORIDE 200 MG/10ML IV SOSY
PREFILLED_SYRINGE | INTRAVENOUS | Status: AC
  Filled 2017-05-20: qty 10

## 2017-05-20 MED ORDER — CLINDAMYCIN PHOSPHATE 900 MG/50ML IV SOLN
900.0000 mg | INTRAVENOUS | Status: AC
  Administered 2017-05-20: 900 mg via INTRAVENOUS

## 2017-05-20 MED ORDER — MEPERIDINE HCL 25 MG/ML IJ SOLN: 6.2500 mg | INTRAMUSCULAR | Status: DC | PRN

## 2017-05-20 MED ORDER — FENTANYL CITRATE (PF) 100 MCG/2ML IJ SOLN
50.0000 ug | INTRAMUSCULAR | Status: DC | PRN
  Administered 2017-05-20: 100 ug via INTRAVENOUS
  Administered 2017-05-20: 50 ug via INTRAVENOUS

## 2017-05-20 MED ORDER — ONDANSETRON HCL 4 MG/2ML IJ SOLN
INTRAMUSCULAR | Status: AC
  Filled 2017-05-20: qty 2

## 2017-05-20 MED ORDER — MIDAZOLAM HCL 2 MG/2ML IJ SOLN
INTRAMUSCULAR | Status: AC
  Filled 2017-05-20: qty 2

## 2017-05-20 MED ORDER — ONDANSETRON HCL 4 MG/2ML IJ SOLN
INTRAMUSCULAR | Status: DC | PRN
  Administered 2017-05-20: 4 mg via INTRAVENOUS

## 2017-05-20 MED ORDER — DEXAMETHASONE SODIUM PHOSPHATE 10 MG/ML IJ SOLN
INTRAMUSCULAR | Status: AC
  Filled 2017-05-20: qty 1

## 2017-05-20 MED ORDER — PHENYLEPHRINE HCL 10 MG/ML IJ SOLN
INTRAVENOUS | Status: DC | PRN
  Administered 2017-05-20: 40 ug/min via INTRAVENOUS

## 2017-05-20 MED ORDER — CHLORHEXIDINE GLUCONATE 4 % EX LIQD: 60.0000 mL | Freq: Once | CUTANEOUS | Status: DC

## 2017-05-20 MED ORDER — BUPIVACAINE-EPINEPHRINE (PF) 0.5% -1:200000 IJ SOLN
INTRAMUSCULAR | Status: DC | PRN
  Administered 2017-05-20: 30 mL via PERINEURAL

## 2017-05-20 MED ORDER — ROCURONIUM BROMIDE 100 MG/10ML IV SOLN
INTRAVENOUS | Status: DC | PRN
  Administered 2017-05-20: 50 mg via INTRAVENOUS

## 2017-05-20 MED ORDER — FENTANYL CITRATE (PF) 100 MCG/2ML IJ SOLN
INTRAMUSCULAR | Status: AC
  Filled 2017-05-20: qty 2

## 2017-05-20 MED ORDER — SODIUM CHLORIDE 0.9 % IR SOLN
Status: DC | PRN
  Administered 2017-05-20: 25000 mL

## 2017-05-20 MED ORDER — HYDROCODONE-ACETAMINOPHEN 7.5-325 MG PO TABS: 1.0000 | ORAL_TABLET | Freq: Once | ORAL | Status: DC | PRN

## 2017-05-20 MED ORDER — MIDAZOLAM HCL 2 MG/2ML IJ SOLN
1.0000 mg | INTRAMUSCULAR | Status: DC | PRN
  Administered 2017-05-20 (×2): 2 mg via INTRAVENOUS

## 2017-05-20 MED ORDER — SUGAMMADEX SODIUM 200 MG/2ML IV SOLN
INTRAVENOUS | Status: DC | PRN
  Administered 2017-05-20: 150 mg via INTRAVENOUS

## 2017-05-20 MED ORDER — ACETAMINOPHEN 500 MG PO TABS: 1000.0000 mg | ORAL_TABLET | Freq: Three times a day (TID) | ORAL | 0 refills | Status: AC

## 2017-05-20 MED ORDER — PHENYLEPHRINE HCL 10 MG/ML IJ SOLN
INTRAMUSCULAR | Status: DC | PRN
  Administered 2017-05-20 (×2): 80 ug via INTRAVENOUS

## 2017-05-20 MED ORDER — EPHEDRINE 5 MG/ML INJ
INTRAVENOUS | Status: AC
  Filled 2017-05-20: qty 10

## 2017-05-20 MED ORDER — LIDOCAINE HCL (CARDIAC) 20 MG/ML IV SOLN
INTRAVENOUS | Status: DC | PRN
  Administered 2017-05-20: 40 mg via INTRAVENOUS
  Administered 2017-05-20: 50 mg via INTRAVENOUS

## 2017-05-20 MED ORDER — CLINDAMYCIN PHOSPHATE 900 MG/50ML IV SOLN
INTRAVENOUS | Status: AC
  Filled 2017-05-20: qty 50

## 2017-05-20 MED ORDER — ROCURONIUM BROMIDE 10 MG/ML (PF) SYRINGE
PREFILLED_SYRINGE | INTRAVENOUS | Status: AC
  Filled 2017-05-20: qty 5

## 2017-05-20 MED ORDER — EPHEDRINE SULFATE 50 MG/ML IJ SOLN
INTRAMUSCULAR | Status: DC | PRN
  Administered 2017-05-20: 15 mg via INTRAVENOUS
  Administered 2017-05-20: 10 mg via INTRAVENOUS

## 2017-05-20 SURGICAL SUPPLY — 84 items
2.6 FiberTak Suture Tape ×9 IMPLANT
5.0MM BONE CUTTER ×3 IMPLANT
5.0MM OVAL BUR ×3 IMPLANT
ANCHOR SUT SWIVELLOK BIO 7X1 (Anchor) ×3 IMPLANT
BLADE 15 SAFETY STRL DISP (BLADE) ×3 IMPLANT
BLADE CUTTER GATOR 3.5 (BLADE) IMPLANT
BLADE CUTTER MENIS 5.5 (BLADE) IMPLANT
BLADE GREAT WHITE 4.2 (BLADE) IMPLANT
BLADE GREAT WHITE 4.2MM (BLADE)
BNDG COHESIVE 4X5 TAN STRL (GAUZE/BANDAGES/DRESSINGS) ×3 IMPLANT
BUR OVAL 4.0 (BURR) IMPLANT
BUR OVAL 6.0 (BURR) IMPLANT
CANNULA 5.75X71 LONG (CANNULA) ×3 IMPLANT
CANNULA PASSPORT BUTTON 10-40 (CANNULA) ×3 IMPLANT
CANNULA TWIST IN 8.25X7CM (CANNULA) IMPLANT
CHLORAPREP W/TINT 26ML (MISCELLANEOUS) ×6 IMPLANT
CLOSURE WOUND 1/2 X4 (GAUZE/BANDAGES/DRESSINGS)
DECANTER SPIKE VIAL GLASS SM (MISCELLANEOUS) IMPLANT
DRAPE IMP U-DRAPE 54X76 (DRAPES) ×3 IMPLANT
DRAPE INCISE IOBAN 66X45 STRL (DRAPES) ×3 IMPLANT
DRAPE U-SHAPE 47X51 STRL (DRAPES) ×6 IMPLANT
DRAPE U-SHAPE 76X120 STRL (DRAPES) IMPLANT
DRSG PAD ABDOMINAL 8X10 ST (GAUZE/BANDAGES/DRESSINGS) ×3 IMPLANT
ELECT NEEDLE TIP 2.8 STRL (NEEDLE) IMPLANT
ELECT REM PT RETURN 9FT ADLT (ELECTROSURGICAL) ×3
ELECTRODE REM PT RTRN 9FT ADLT (ELECTROSURGICAL) ×1 IMPLANT
GAUZE SPONGE 4X4 12PLY STRL (GAUZE/BANDAGES/DRESSINGS) ×3 IMPLANT
GAUZE XEROFORM 1X8 LF (GAUZE/BANDAGES/DRESSINGS) IMPLANT
GLOVE BIO SURGEON STRL SZ7.5 (GLOVE) IMPLANT
GLOVE BIOGEL PI IND STRL 7.0 (GLOVE) ×2 IMPLANT
GLOVE BIOGEL PI IND STRL 8 (GLOVE) ×2 IMPLANT
GLOVE BIOGEL PI INDICATOR 7.0 (GLOVE) ×4
GLOVE BIOGEL PI INDICATOR 8 (GLOVE) ×4
GLOVE ECLIPSE 6.5 STRL STRAW (GLOVE) ×3 IMPLANT
GLOVE ECLIPSE 7.0 STRL STRAW (GLOVE) ×3 IMPLANT
GLOVE SURG ORTHO 8.0 STRL STRW (GLOVE) ×3 IMPLANT
GOWN STRL REUS W/ TWL LRG LVL3 (GOWN DISPOSABLE) ×2 IMPLANT
GOWN STRL REUS W/ TWL XL LVL3 (GOWN DISPOSABLE) ×1 IMPLANT
GOWN STRL REUS W/TWL LRG LVL3 (GOWN DISPOSABLE) ×4
GOWN STRL REUS W/TWL XL LVL3 (GOWN DISPOSABLE) ×5 IMPLANT
IMMOBILIZER SHOULDER FOAM XLGE (SOFTGOODS) ×3 IMPLANT
IV NS IRRIG 3000ML ARTHROMATIC (IV SOLUTION) ×27 IMPLANT
KIT BIO-TENODESIS 3X8 DISP (MISCELLANEOUS) ×2
KIT INSRT BABSR STRL DISP BTN (MISCELLANEOUS) ×1 IMPLANT
KIT STABILIZATION SHOULDER (MISCELLANEOUS) ×3 IMPLANT
LASSO 90 CVE QUICKPAS (DISPOSABLE) IMPLANT
MANIFOLD NEPTUNE II (INSTRUMENTS) ×6 IMPLANT
NDL SUT 6 .5 CRC .975X.05 MAYO (NEEDLE) IMPLANT
NEEDLE MAYO TAPER (NEEDLE)
NEEDLE SCORPION MULTI FIRE (NEEDLE) ×3 IMPLANT
PACK ARTHROSCOPY DSU (CUSTOM PROCEDURE TRAY) ×3 IMPLANT
PACK BASIN DAY SURGERY FS (CUSTOM PROCEDURE TRAY) ×3 IMPLANT
PENCIL BUTTON HOLSTER BLD 10FT (ELECTRODE) ×3 IMPLANT
PROBE BIPOLAR ATHRO 135MM 90D (MISCELLANEOUS) ×3 IMPLANT
RESTRAINT HEAD UNIVERSAL NS (MISCELLANEOUS) ×3 IMPLANT
SET ARTHROSCOPY TUBING (MISCELLANEOUS)
SET ARTHROSCOPY TUBING LN (MISCELLANEOUS) IMPLANT
SHEET MEDIUM DRAPE 40X70 STRL (DRAPES) ×3 IMPLANT
SLEEVE SCD COMPRESS KNEE MED (MISCELLANEOUS) ×3 IMPLANT
SLING ARM FOAM STRAP LRG (SOFTGOODS) IMPLANT
SLING ARM IMMOBILIZER LRG (SOFTGOODS) IMPLANT
SLING ARM IMMOBILIZER MED (SOFTGOODS) IMPLANT
SLING ARM MED ADULT FOAM STRAP (SOFTGOODS) IMPLANT
SLING ARM XL FOAM STRAP (SOFTGOODS) IMPLANT
SPONGE LAP 4X18 X RAY DECT (DISPOSABLE) ×3 IMPLANT
STRIP CLOSURE SKIN 1/2X4 (GAUZE/BANDAGES/DRESSINGS) IMPLANT
SUCTION FRAZIER HANDLE 10FR (MISCELLANEOUS)
SUCTION TUBE FRAZIER 10FR DISP (MISCELLANEOUS) IMPLANT
SUT ETHILON 3 0 PS 1 (SUTURE) IMPLANT
SUT FIBERWIRE #2 38 T-5 BLUE (SUTURE) ×3
SUT MON AB 2-0 CT1 36 (SUTURE) IMPLANT
SUT TIGER TAPE 7 IN WHITE (SUTURE) IMPLANT
SUT VIC AB 0 CT1 27 (SUTURE)
SUT VIC AB 0 CT1 27XBRD ANBCTR (SUTURE) IMPLANT
SUT VIC AB 2-0 SH 27 (SUTURE)
SUT VIC AB 2-0 SH 27XBRD (SUTURE) IMPLANT
SUTURE FIBERWR #2 38 T-5 BLUE (SUTURE) ×1 IMPLANT
TAPE FIBER 2MM 7IN #2 BLUE (SUTURE) IMPLANT
TOWEL OR 17X24 6PK STRL BLUE (TOWEL DISPOSABLE) ×3 IMPLANT
TOWEL OR NON WOVEN STRL DISP B (DISPOSABLE) ×3 IMPLANT
TUBE CONNECTING 20'X1/4 (TUBING) ×1
TUBE CONNECTING 20X1/4 (TUBING) ×2 IMPLANT
TUBING ARTHROSCOPY IRRIG 16FT (MISCELLANEOUS) ×3 IMPLANT
YANKAUER SUCT BULB TIP NO VENT (SUCTIONS) ×3 IMPLANT

## 2017-05-20 NOTE — Op Note (Addendum)
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 Subscapularis and teres minor intact. There was some grade 1 chondral change of the glenoid notably posteriorly. The tendon itself was retracted past the base of the glenoid and was significantly of poor quality. We're able to perform a single row repair. Subacromial decompression and distal clavicle excision without issue.  Open subpectoral biceps tenodesis was performed due to significant swelling and oozing leading to poor visualization throughout the case.  Post-operative plan: The patient will be nonweightbearing in a sling.  The patient will be discharged home.  DVT prophylaxis not indicated and isolated upper extremity surgery and a low risk patient.  Pain control with PRN pain medication preferring oral medicines.  Follow up plan will be scheduled in approximately 10-14 days for wound check and suture removal with outlet view x-ray.  Surgeons:Primary: Hiram Gash, MD Location: [facility] OR ROOM 2 Anesthesia: General Antibiotics: Clindamycin Tourniquet time: None Estimated Blood Loss: Minimal Complications: None Specimens: None Implants:  Implant Name Type Inv. Item Serial No. Manufacturer Lot No. LRB No. Used Action  2.6 FiberTak Suture Tape    [employer] [id number] Left 3 Implanted  61mm swivelLock tenodesis, biocomposite       [employer] [id number] Left 1 Implanted    Indications for Surgery:    Kimberly Wade is a 46 y.o. female with Acute on chronic left shoulder pain. She had no specific new injury of traumatic significance but had had previous injuries and some shoulder soreness.  MRI demonstrated a large complete retracted tear of the supra and infraspinatus past level of the glenoid. Additionally she has pain at her before meals joint as well as with impingement maneuvers and with speed's test. In this young patient who elected to proceed with a attempt of rotator cuff repair as well as dealing with her concomitant issues.   Specific risks were discussed including the possibility of re-tear which we quoted a greater than 50% and the need for further surgery.  Benefits and risks of operative and nonoperative management were discussed prior to surgery with patient/guardian(s) and informed consent form was completed.     Procedure:   The patient was identified in the preoperative holding area where the surgical site was marked. The patient was taken to the OR where a procedural timeout was called and the above noted anesthesia was induced.  Preoperative antibiotics were dosed.  Patient was positioned beachchair on Allen table with the spider positioner. The patient's left shoulder was prepped and draped in the usual sterile fashion.  A second preoperative timeout was called.      We began with standard diagnostic arthroscopy utilizing a posterior portal standard mid lateral portal and an anterior portal. We were able to identify some chondral changes of the glenoid specifically posteriorly grade 1, labrum was intact. She greater than 5 mm of translation at her anterior labrum at the biceps anchor the biceps was tenotomized for later tenodesis. We examined the footprint and noted that there was a massive tear of the supraspinatus and infraspinatus with retraction past the glenoid.  He took this time to make her mid lateral portal and prepped the footprint down to bleeding bone.  This point  we placed our scope in the subacromial and performed an extensive debridement of the bursal tissue from the remaining cuff and devitalized cuff stump with shaver and arthrocare. We then were able to perform a conversion of her type II acromion to a type I acromion utilizing number performing subacromial decompression. We're able to visualize the distal aspect of the clavicle without issue cleared soft tissue for this for later DCR.  This point we took time to assess the tear itself. The significant retraction and poor tissue quality required [place] to perform mobilization by passing a blunt elevator superior to the cuff taking care to avoid the neurovascular structures. We able to mobilize the cuff to perform a single row repair. [employer] fiber tach double loaded suture anchors were used and passed with the scorpion device all holding the cuff reduced. We're able to achieve a stable repair of the infraspinatus and supraspinatus with moderate tension. Tissue quality was objectively poor.    The rotator cuff repair was performed we examined the shoulder noted that there is significant swelling and felt that further arthroscopic biceps tenodesis would be precluded due to her swelling. We then turned our attention to the distal clavicle resection. Used the posterior portal for visualization and the anterior portal to clear soft tissue and then perform a distal clavicle resection of 10 mm of bone. We took care to note that there is no remaining bone superiorly or posteriorly and that we had a clear resection.    We finally turned our attention to the biceps tenodesis portion of the case. A longitudinal incision was made just distal to the pectoralis major insertion on the humerus in line with the biceps groove. We incised the skin and carefully dissected down to the level of the bicipital fascia. Fascia was identified and incised with a knife. Fasciotomy the length of our 2 cm incision. At this point angle palpation was  used to identify the bicipital tendon that had been tenotomized that was residing in the bicipital groove. It was extracted from the incision and the 2 cm from the musculotendinous junction the tendon was whipstitched towards the musculotendinous junction and the remaining tendon was sharply excised. We then turned our attention to placement of retractors. A Hohmann retractor was placed over the lateral aspect of the humerus carefully the arm in 60 of abduction. At this point we used a curved Mayo scissor to carefully dissect around the medial aspect humerus and placed a Hohmann retractor under direct visualization avoiding the neurovascular bundle. These were held carefully in place while the periosteum was cleared from the bicipital groove at the superior aspect of our incision. At this point we were able to place a guidewire at the superior aspect of the incision. We reamed over the guidewire to 7.5 mm.  swivel lock biceps tenodesis device was used to hold the tendon in place. At this point under direct visualization we were able to watch the tendon stump proceeding into the reamed tunnel and were able to secure its placement by tying the suture limbs over a bony bridge. The elbow was flexed and extended and the tendon was confirmed to not extract from the tunnel.  Incision was thoroughly irrigated.  The wound was closed in a multilayer fashion.  A sterile dressing was placed.  The patient was awoken from general anesthesia and taken to the PACU in stable  condition without complication.     Tawanna Cooler, PA, present and scrubbed throughout the case, critical for completion in a timely fashion, and for retraction, instrumentation, closure.

## 2017-05-20 NOTE — Progress Notes (Signed)
Assisted Dr. Foster with left, ultrasound guided, supraclavicular block. Side rails up, monitors on throughout procedure. See vital signs in flow sheet. Tolerated Procedure well. ?

## 2017-05-20 NOTE — Discharge Instructions (Signed)

## 2017-05-20 NOTE — H&P (Signed)
PREOPERATIVE H&P  Chief Complaint: LEFT SHOULDER OSTEOARTHRITIS, IMPINGEMENT SYNDROME, STRAIN OF MUSCLE AND TENDON OF THE ROTATOR CUFF   HPI: Kimberly Wade is a 46 y.o. female who presents for preoperative history and physical with a diagnosis of LEFT SHOULDER OSTEOARTHRITIS, IMPINGEMENT SYNDROME, STRAIN OF MUSCLE AND TENDON OF THE ROTATOR CUFF . Symptoms are rated as moderate to severe, and have been worsening.  This is significantly impairing activities of daily living.  She has elected for surgical management.   Past Medical History:  Diagnosis Date  . Anemia   . Anxiety   . Arthritis   . Chronic pain    has pain contract for Norco 5/325mg   . GERD (gastroesophageal reflux disease)   . Hyperlipidemia   . Hypertension   . Insomnia   . Pneumococcal pneumonia ([place]) 2000  . Pneumonia    Past Surgical History:  Procedure Laterality Date  . CESAREAN SECTION     x4  . CHEST TUBE INSERTION    . chest tubes  2000  . dysplastic mole removed     right ankle  . GASTRIC BYPASS    . INTRAUTERINE DEVICE (IUD) INSERTION N/A 07/11/2014   Procedure: INTRAUTERINE DEVICE (IUD) INSERTION;  Surgeon: Emily Filbert, MD;  Location: [facility];  Service: Gynecology;  Laterality: N/A;  . IUD REMOVAL N/A 07/11/2014   Procedure: INTRAUTERINE DEVICE (IUD) REMOVAL;  Surgeon: Emily Filbert, MD;  Location: [facility];  Service: Gynecology;  Laterality: N/A;  . mini gastric bypass  2011  . recoonstruction of right lung    . VIDEO BRONCHOSCOPY Bilateral 04/20/2013   Procedure: VIDEO BRONCHOSCOPY WITH FLUORO;  Surgeon: Elsie Stain, MD;  Location: [facility];  Service: Cardiopulmonary;  Laterality: Bilateral;   Social History   Social History  . Marital status: Legally Separated    Spouse name: N/A  . Number of children: N/A  . Years of education: N/A   Occupational History  . 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 Smoking status: Never Smoker  . Smokeless tobacco:  Never Used  . Alcohol use Yes     Comment: 1 glass per month  . Drug use: No  . Sexual activity: Yes    Partners: Male    Birth control/ protection: IUD   Other Topics Concern  . None   Social History Narrative  . None   Family History  Problem Relation Age of Onset  . Colon cancer Unknown        grandmother  . Heart attack Unknown        grandfather  . Hyperlipidemia Unknown   . Diabetes Father        father  . Hypertension Father   . Hypertension Mother   . Asthma Daughter    Allergies  Allergen Reactions  . Levaquin [Levofloxacin In D5w] Hives    Has tolerated avelox  . Penicillins Hives   Prior to Admission medications   Medication Sig Start Date End Date Taking? Authorizing Provider  Biotin 5000 MCG TABS Take 1 tablet by mouth 2 (two) times daily.   Yes [provider]  busPIRone (BUSPAR) 7.5 MG tablet Take 1 tablet (7.5 mg total) by mouth 3 (three) times daily. 12/05/16  Yes Breeback, Jade L, PA-C  Calcium Citrate-Vitamin D (CALCIUM CITRATE + PO) Take 1 tablet by mouth daily.    Yes [provider]  cyclobenzaprine (FLEXERIL) 10 MG tablet 1/2-1 tab by mouth daily at bedtime 04/09/17  Yes Thekkekandam,  Gwen Her, MD  ferrous sulfate 325 (65 FE) MG tablet Take 1 tablet (325 mg total) by mouth 2 (two) times daily with a meal. 12/21/15  Yes Hommel, Sean, DO  hydrochlorothiazide (HYDRODIURIL) 25 MG tablet Take 1-2 tablets (25-50 mg total) by mouth daily. Due for follow up visit 04/21/17  Yes Breeback, Jade L, PA-C  HYDROcodone-acetaminophen (NORCO/VICODIN) 5-325 MG tablet Take 1 tablet by mouth every 6 (six) hours as needed for moderate pain or severe pain. 05/07/17  Yes Silverio Decamp, MD  lisinopril-hydrochlorothiazide (ZESTORETIC) 20-12.5 MG tablet Take 1 tablet by mouth daily. 05/14/17  Yes Breeback, Jade L, PA-C  meloxicam (MOBIC) 15 MG tablet One tab PO qAM with breakfast for 2 weeks, then daily prn pain. 04/03/17  Yes Silverio Decamp, MD   Multiple Vitamin (MULTIVITAMIN) tablet Take 1 tablet by mouth 3 (three) times daily.   Yes [provider]  ondansetron (ZOFRAN-ODT) 8 MG disintegrating tablet Take 1 tablet (8 mg total) by mouth every 8 (eight) hours as needed for nausea. 06/18/16  Yes Silverio Decamp, MD  phentermine 37.5 MG capsule Take 37.5 mg by mouth every morning.   Yes [provider]  pyridOXINE (VITAMIN B-6) 25 MG tablet Take by mouth.   Yes [provider]  traZODone (DESYREL) 100 MG tablet TAKE 1 TO 3 TABLET BY MOUTH AT BEDTIME ONLY IF NEEDED. 12/05/16  Yes Breeback, Jade L, PA-C  venlafaxine XR (EFFEXOR-XR) 75 MG 24 hr capsule TAKE ONE CAPSULE BY MOUTH DAILY FOR BREAKFAST 01/28/17  Yes Breeback, Jade L, PA-C  Vitamin D, Ergocalciferol, (DRISDOL) 50000 units CAPS capsule TAKE 1 CAPSULE BY MOUTH EVERY 7 DAYS. 04/04/16  Yes Hommel, Sean, DO  ALPRAZolam (XANAX) 1 MG tablet Take 1 tablet (1 mg total) by mouth 2 (two) times daily as needed for anxiety or sleep. 12/05/16   Breeback, Jade L, PA-C  SUMAtriptan 6 MG/0.5ML SOAJ INJECT 1 INJECTION UNDER THE SKIN AS NEEDED FOR MIGRAINE MAY REPEAT ONCE IN 2 HOURS IF NOT EFFECTIVE. Due for follow up appointment. Patient not taking: Reported on 05/07/2017 06/01/15   Marcial Pacas, DO     Positive ROS: All other systems have been reviewed and were otherwise negative with the exception of those mentioned in the HPI and as above.  Physical Exam: General: Alert, no acute distress Cardiovascular: No pedal edema Respiratory: No cyanosis, no use of accessory musculature GI: No organomegaly, abdomen is soft and non-tender Skin: No lesions in the area of chief complaint Neurologic: Sensation intact distally Psychiatric: Patient is competent for consent with normal mood and affect Lymphatic: No axillary or cervical lymphadenopathy  MUSCULOSKELETAL: L shoulder 4/5 supra/infra, +ac ttp, +speeds, wwp, NVID  Assessment: LEFT SHOULDER OSTEOARTHRITIS,  IMPINGEMENT SYNDROME, STRAIN OF MUSCLE AND TENDON OF THE ROTATOR CUFF   Plan: Plan for Procedure(s): LEFT SHOULDER ARTHROSCOPY WITH DEBRIDEMENT, ACROMIOPLASTY, DISTAL CLAVICAL EXCISION, ROTATOR CUFF REPAIR, BICEP TENODESIS  The risks benefits and alternatives were discussed with the patient including but not limited to the risks of nonoperative treatment, versus surgical intervention including infection, bleeding, nerve injury,  blood clots, cardiopulmonary complications, morbidity, mortality, among others, and they were willing to proceed.   Hiram Gash, MD  05/20/2017 8:56 AM

## 2017-05-20 NOTE — Anesthesia Procedure Notes (Signed)
Anesthesia Regional Block: Supraclavicular block   Pre-Anesthetic Checklist: ,, timeout performed, Correct Patient, Correct Site, Correct Laterality, Correct Procedure, Correct Position, site marked, Risks and benefits discussed,  Surgical consent,  Pre-op evaluation,  At surgeon's request and post-op pain management  Laterality: Left  Prep: chloraprep       Needles:  Injection technique: Single-shot  Needle Type: Echogenic Stimulator Needle     Needle Length: 9cm  Needle Gauge: 21   Needle insertion depth: 5 cm   Additional Needles:   Procedures:,,,, ultrasound used (permanent image in chart),,,,  Motor weakness within 5 minutes.  Narrative:  Start time: 05/20/2017 9:00 AM End time: 05/20/2017 9:05 AM Injection made incrementally with aspirations every 5 mL.  Performed by: Personally  Anesthesiologist: Josephine Igo  Additional Notes: Timeout performed. Patient sedated. Relevant anatomy ID'd using [place]. Incremental 2-56ml injection of LA with frequent aspiration. Patient tolerated procedure well.

## 2017-05-20 NOTE — Transfer of Care (Signed)
Immediate Anesthesia Transfer of Care Note  Patient: Kimberly Wade  Procedure(s) Performed: LEFT SHOULDER ARTHROSCOPY WITH DEBRIDEMENT, ACROMIOPLASTY, DISTAL CLAVICAL EXCISION, ROTATOR CUFF REPAIR, BICEP TENODESIS (Left Shoulder)  Patient Location: PACU  Anesthesia Type:GA combined with regional for post-op pain  Level of Consciousness: awake, alert  and oriented  Airway & Oxygen Therapy: Patient Spontanous Breathing and Patient connected to face mask oxygen  Post-op Assessment: Report given to RN and Post -op Vital signs reviewed and stable  Post vital signs: Reviewed and stable  Last Vitals:  Vitals:   05/20/17 0821 05/20/17 0900  BP: 114/64 119/74  Pulse: 74 73  Resp: 18 18  Temp: 37 C   SpO2: 100% 100%    Last Pain:  Vitals:   05/20/17 0821  TempSrc: Oral  PainSc: 7       Patients Stated Pain Goal: 4 (09/62/83 6629)  Complications: No apparent anesthesia complications

## 2017-05-20 NOTE — Anesthesia Postprocedure Evaluation (Signed)
Anesthesia Post Note  Patient: Yaritsa Savarino  Procedure(s) Performed: LEFT SHOULDER ARTHROSCOPY WITH DEBRIDEMENT, ACROMIOPLASTY, DISTAL CLAVICAL EXCISION, ROTATOR CUFF REPAIR, OPEN BICEP TENODESIS (Left Shoulder)     Patient location during evaluation: PACU Anesthesia Type: General Level of consciousness: awake and alert Pain management: pain level controlled Vital Signs Assessment: post-procedure vital signs reviewed and stable Respiratory status: spontaneous breathing, nonlabored ventilation and respiratory function stable Cardiovascular status: blood pressure returned to baseline and stable Postop Assessment: no apparent nausea or vomiting Anesthetic complications: no    Last Vitals:  Vitals:   05/20/17 1300 05/20/17 1315  BP: 110/62 (!) 106/57  Pulse: (!) 112 (!) 110  Resp: 18 17  Temp:    SpO2: 97% 97%    Last Pain:  Vitals:   05/20/17 1315  TempSrc:   PainSc: 1                  Garry Nicolini A.

## 2017-05-20 NOTE — Anesthesia Preprocedure Evaluation (Signed)
Anesthesia Evaluation  Patient identified by MRN, date of birth, ID band Patient awake    Reviewed: Allergy & Precautions, NPO status , Patient's Chart, lab work & pertinent test results  Airway Mallampati: II  TM Distance: >3 FB Neck ROM: Full    Dental  (+) Caps   Pulmonary pneumonia, resolved,    Pulmonary exam normal breath sounds clear to auscultation       Cardiovascular hypertension, Pt. on medications Normal cardiovascular exam Rhythm:Regular Rate:Normal     Neuro/Psych  Headaches, PSYCHIATRIC DISORDERS Anxiety Depression    GI/Hepatic Neg liver ROS, GERD  Medicated and Controlled,Hx/o Roux en Y gastric bypass   Endo/Other  Hyperlipidemia  Renal/GU negative Renal ROS  negative genitourinary   Musculoskeletal  (+) Arthritis , Impingement syndrome left shoulder   Abdominal   Peds  Hematology  (+) anemia ,   Anesthesia Other Findings   Reproductive/Obstetrics                             Anesthesia Physical Anesthesia Plan  ASA: II  Anesthesia Plan: General   Post-op Pain Management:  Regional for Post-op pain   Induction: Intravenous  PONV Risk Score and Plan: 3 and Ondansetron, Dexamethasone, Midazolam and Propofol infusion  Airway Management Planned: Oral ETT and LMA  Additional Equipment:   Intra-op Plan:   Post-operative Plan: Extubation in OR  Informed Consent: I have reviewed the patients History and Physical, chart, labs and discussed the procedure including the risks, benefits and alternatives for the proposed anesthesia with the patient or authorized representative who has indicated his/her understanding and acceptance.   Dental advisory given  Plan Discussed with: CRNA, Anesthesiologist and Surgeon  Anesthesia Plan Comments:         Anesthesia Quick Evaluation

## 2017-05-20 NOTE — Anesthesia Procedure Notes (Signed)
Procedure Name: Intubation Date/Time: 05/20/2017 9:20 AM Performed by: Melynda Ripple D Pre-anesthesia Checklist: Patient identified, Emergency Drugs available, Suction available and Patient being monitored Patient Re-evaluated:Patient Re-evaluated prior to induction Oxygen Delivery Method: Circle system utilized Preoxygenation: Pre-oxygenation with 100% oxygen Induction Type: IV induction Ventilation: Mask ventilation without difficulty Laryngoscope Size: Mac Grade View: Grade I Tube type: Oral Tube size: 7.0 mm Number of attempts: 1 Airway Equipment and Method: Stylet and Oral airway Placement Confirmation: ETT inserted through vocal cords under direct vision,  positive ETCO2 and breath sounds checked- equal and bilateral Secured at: 22 cm Tube secured with: Tape Dental Injury: Teeth and Oropharynx as per pre-operative assessment

## 2017-05-22 ENCOUNTER — Encounter (HOSPITAL_BASED_OUTPATIENT_CLINIC_OR_DEPARTMENT_OTHER): Payer: Self-pay | Admitting: Orthopaedic Surgery

## 2017-05-26 ENCOUNTER — Other Ambulatory Visit: Payer: Self-pay | Admitting: Physician Assistant

## 2017-05-26 ENCOUNTER — Telehealth: Payer: Self-pay | Admitting: Physician Assistant

## 2017-05-26 DIAGNOSIS — I10 Essential (primary) hypertension: Secondary | ICD-10-CM

## 2017-05-26 DIAGNOSIS — M19012 Primary osteoarthritis, left shoulder: Secondary | ICD-10-CM | POA: Diagnosis not present

## 2017-05-26 NOTE — Telephone Encounter (Signed)
Jade, Please advise if ok to refill Hydrocodone.Suanne Marker Armani Brar,CMA

## 2017-05-26 NOTE — Telephone Encounter (Signed)
Pt called back & left vm stating that her surgeon only filled her pain medication one time, Dr. Darene Lamer filled it once after that, and that her pain contract is with you.  She wants to know why you've only been writing #45 instead of #90 because #45 don't get her through the day.

## 2017-05-26 NOTE — Telephone Encounter (Signed)
Left message on patient vm regarding this. Jennifr Gaeta,CMA

## 2017-05-26 NOTE — Telephone Encounter (Signed)
I believe she is being seen by a surgeon for her shoulder who should be prescribing her pain medication at this point. Can you confirm?

## 2017-05-26 NOTE — Telephone Encounter (Signed)
Patient called adv that she just had shoulder surgery last week and she needs a refill for hydrocodone.Please call pt to advise. Thanks

## 2017-05-27 ENCOUNTER — Other Ambulatory Visit: Payer: Self-pay | Admitting: Physician Assistant

## 2017-05-27 MED ORDER — HYDROCODONE-ACETAMINOPHEN 5-325 MG PO TABS
1.0000 | ORAL_TABLET | Freq: Four times a day (QID) | ORAL | 0 refills | Status: DC | PRN
Start: 2017-05-27 — End: 2017-06-08

## 2017-05-27 NOTE — Telephone Encounter (Signed)
Ok refilled for 30 tablets. Not seen her since April. Need to have office visit to fullfill pain contract requirements. Our hope is that after the surgery you would be able to decrease pain medication. Lets follow up and see where you are with pain. Pick up 30 tablets at front desk.

## 2017-06-08 ENCOUNTER — Ambulatory Visit (INDEPENDENT_AMBULATORY_CARE_PROVIDER_SITE_OTHER): Payer: BLUE CROSS/BLUE SHIELD | Admitting: Physician Assistant

## 2017-06-08 ENCOUNTER — Encounter: Payer: Self-pay | Admitting: Podiatry

## 2017-06-08 ENCOUNTER — Encounter: Payer: Self-pay | Admitting: Physician Assistant

## 2017-06-08 ENCOUNTER — Ambulatory Visit (INDEPENDENT_AMBULATORY_CARE_PROVIDER_SITE_OTHER): Payer: BLUE CROSS/BLUE SHIELD | Admitting: Podiatry

## 2017-06-08 VITALS — BP 117/59 | HR 82 | Ht 65.98 in | Wt 160.0 lb

## 2017-06-08 VITALS — BP 113/55 | HR 76 | Ht 66.0 in | Wt 153.0 lb

## 2017-06-08 DIAGNOSIS — Z9889 Other specified postprocedural states: Secondary | ICD-10-CM

## 2017-06-08 DIAGNOSIS — F411 Generalized anxiety disorder: Secondary | ICD-10-CM

## 2017-06-08 DIAGNOSIS — M75122 Complete rotator cuff tear or rupture of left shoulder, not specified as traumatic: Secondary | ICD-10-CM | POA: Diagnosis not present

## 2017-06-08 DIAGNOSIS — Z23 Encounter for immunization: Secondary | ICD-10-CM

## 2017-06-08 DIAGNOSIS — M25512 Pain in left shoulder: Secondary | ICD-10-CM | POA: Diagnosis not present

## 2017-06-08 DIAGNOSIS — F419 Anxiety disorder, unspecified: Secondary | ICD-10-CM

## 2017-06-08 DIAGNOSIS — S99921A Unspecified injury of right foot, initial encounter: Secondary | ICD-10-CM | POA: Diagnosis not present

## 2017-06-08 DIAGNOSIS — M79674 Pain in right toe(s): Secondary | ICD-10-CM

## 2017-06-08 DIAGNOSIS — F330 Major depressive disorder, recurrent, mild: Secondary | ICD-10-CM

## 2017-06-08 DIAGNOSIS — I10 Essential (primary) hypertension: Secondary | ICD-10-CM | POA: Diagnosis not present

## 2017-06-08 DIAGNOSIS — S46212D Strain of muscle, fascia and tendon of other parts of biceps, left arm, subsequent encounter: Secondary | ICD-10-CM | POA: Insufficient documentation

## 2017-06-08 MED ORDER — HYDROCODONE-ACETAMINOPHEN 5-325 MG PO TABS: 1.0000 | ORAL_TABLET | Freq: Four times a day (QID) | ORAL | 0 refills | Status: DC | PRN

## 2017-06-08 MED ORDER — VENLAFAXINE HCL ER 150 MG PO CP24: 150.0000 mg | ORAL_CAPSULE | Freq: Every day | ORAL | 2 refills | Status: DC

## 2017-06-08 MED ORDER — ALPRAZOLAM 1 MG PO TABS: ORAL_TABLET | ORAL | 1 refills | Status: DC

## 2017-06-08 MED ORDER — HYDROCHLOROTHIAZIDE 25 MG PO TABS: ORAL_TABLET | ORAL | 2 refills | Status: DC

## 2017-06-08 MED ORDER — BUSPIRONE HCL 7.5 MG PO TABS: 7.5000 mg | ORAL_TABLET | Freq: Three times a day (TID) | ORAL | 5 refills | Status: DC

## 2017-06-08 NOTE — Patient Instructions (Addendum)
Seen for pain in right great toe with old injury. X-rays and MRI report reviewed.  Fractured part of bone has healed. Noted of limited mobility of the interphalangeal joint of right great toe. Possible joint pain from fibrous scar tissue along the joint that is near the fracture site. Continue current level of care with flat post op shoe as tolerated. If pain continues and fail to heal, may discuss possible fusion of the joint.  Return as needed.

## 2017-06-08 NOTE — Progress Notes (Signed)
Subjective:    Patient ID: Kimberly Wade, female    DOB: 1971-02-05, 46 y.o.   MRN: [id number]  HPI  Pt is a 46 yo pleasant female who presents to the clinic to follow up after left shoulder surgery on 10/3 for complete rotator cuff tear and bicep tendon tear. Pt was on a pain contract before surgery then surgeon prescribled pain meds and now stopped. She is in 10/10 pain when norco wears off. She is completely imobile for 6 weeks then light movement and PT for 6 weeks then full PT. She has been written out of work for 8 weeks. She is taking norco every 6 to 8 hours. S/p gastric bypass she avoid NSAIDs. This chronic pain and being out of work has worsened her depression and anxiety. She has taken 5 xanax in the past 4 weeks. She would like to have some on hand.   She also needs refill of other medications.    ... Active Ambulatory Problems    Diagnosis Date Noted  . Insomnia 12/24/2012  . Depression 12/24/2012  . Migraine 12/24/2012  . Hyperlipidemia 12/24/2012  . Vitamin D deficiency 12/24/2012  . Essential hypertension, benign 12/24/2012  . Anxiety 12/24/2012  . S/P gastric bypass 12/24/2012  . Increased PTH level 12/24/2012  . GERD (gastroesophageal reflux disease) 12/24/2012  . Aspiration into lower respiratory tract 05/02/2013  . Primary osteoarthritis of right hand 02/02/2014  . Tension headache 08/08/2014  . Ovarian cyst, right 09/14/2015  . Metrorrhagia 09/18/2015  . Anemia 10/26/2015  . Right trigger finger 05/05/2016  . Right hand pain 05/05/2016  . Low back pain without sciatica 08/08/2016  . Generalized anxiety disorder 08/09/2016  . Influenza-like illness 08/29/2016  . 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 Overweight (BMI 25.0-29.9) 02/01/2017  . Acute shoulder bursitis, left 04/03/2017  . Biceps muscle tear, left, subsequent encounter 06/08/2017  . Complete tear of left rotator cuff 06/08/2017    Resolved Ambulatory Problems    Diagnosis Date Noted  . Lingular pneumonia 12/24/2012  . Type 2 diabetes mellitus ([place]) 12/24/2012  . Sinusitis, chronic 12/24/2012  . Diabetic retinopathy ([place]) 12/24/2012  . Acute cystitis 06/18/2016   Past Medical History:  Diagnosis Date  . Anemia   . Anxiety   . Arthritis   . Chronic pain   . GERD (gastroesophageal reflux disease)   . Hyperlipidemia   . Hypertension   . Insomnia   . Pneumococcal pneumonia ([place]) 2000  . Pneumonia      Review of Systems  All other systems reviewed and are negative.      Objective:   Physical Exam  Constitutional: She is oriented to person, place, and time. She appears well-developed and well-nourished.  Cardiovascular: Normal rate, regular rhythm and normal heart sounds.   Musculoskeletal:  Pt is in a immobile brace for left shoulder.   Neurological: She is alert and oriented to person, place, and time.  Psychiatric: She has a normal mood and affect. Her behavior is normal.          Assessment & Plan:  Marland KitchenMarland KitchenKanasia was seen today for follow-up.  Diagnoses and all orders for this visit:  Complete tear of left rotator cuff -     HYDROcodone-acetaminophen (NORCO/VICODIN) 5-325 MG tablet; Take 1 tablet by mouth every 6 (six) hours as needed for moderate pain. Do not refill until 07/09/17  Essential hypertension, benign -     hydrochlorothiazide (HYDRODIURIL) 25 MG tablet;  Take one tablet as needed for lower extremity swelling.  Anxiety -     busPIRone (BUSPAR) 7.5 MG tablet; Take 1 tablet (7.5 mg total) by mouth 3 (three) times daily. -     ALPRAZolam (XANAX) 1 MG tablet; Take 1/2 to one tablet as needed for acute anxiety.  Generalized anxiety disorder -     ALPRAZolam (XANAX) 1 MG tablet; Take 1/2 to one tablet as needed for acute anxiety.  Biceps muscle tear, left, subsequent encounter -     HYDROcodone-acetaminophen (NORCO/VICODIN) 5-325 MG tablet; Take 1 tablet by mouth every 6 (six)  hours as needed for moderate pain. Do not refill until 07/09/17  Need for pneumococcal vaccination -     Pneumococcal polysaccharide vaccine 23-valent greater than or equal to 2yo subcutaneous/IM  Influenza vaccine needed -     Flu Vaccine QUAD 6+ mos PF IM (Fluarix Quad PF)  Acute pain of left shoulder -     HYDROcodone-acetaminophen (NORCO/VICODIN) 5-325 MG tablet; Take 1 tablet by mouth every 6 (six) hours as needed for moderate pain. Do not refill until 07/09/17  S/P shoulder surgery -     HYDROcodone-acetaminophen (NORCO/VICODIN) 5-325 MG tablet; Take 1 tablet by mouth every 6 (six) hours as needed for moderate pain. Do not refill until 07/09/17  Mild episode of recurrent major depressive disorder (HCC) -     venlafaxine XR (EFFEXOR-XR) 150 MG 24 hr capsule; Take 1 capsule (150 mg total) by mouth daily with breakfast.  Other orders -     Discontinue: HYDROcodone-acetaminophen (NORCO/VICODIN) 5-325 MG tablet; Take 1 tablet by mouth every 6 (six) hours as needed for moderate pain.   Pain management:  [place] controlled substance database reviewed with no concerns.  Pain contract signed 4/20.  Discussed how addiction risk is still high even though in pain and coming off surgery. Discussed goal is not 100 percent pain free.  Discussed xanax and opiod risk of sudden death increase. Pt is aware and only takes xanax sparingly. She has had 5 in 4 weeks and agrees not to take them together.  Increase effexor. Could help with pain and mood. Discussed common to get more depressed with chronic pain being out of work.  Follow up in 3 months where goal will be to start tapering off pain rx.   Marland KitchenMarland KitchenSpent 30 minutes with patient and greater than 50 percent of visit spent counseling patient regarding treatment plan.  Marland KitchenMarland Kitchen

## 2017-06-08 NOTE — Progress Notes (Signed)
SUBJECTIVE: 46 y.o. year old female presents wearing post op shoe on right foot stating that she broke her toe on Mother's day from a fall in May 2018. Patient is experiencing continued pain and swelling on the affected toe with ambulation.  Patient was referred by her PCP for possible failed healing of fractured right great toe.   S/P Compound fracture of right great toe at proximal phalanx about 1 cm proximal from the joint space (12/21/16). Most recent x-ray done on 04/03/17 show consolidated fracture site without visible gap or free fragments. This is confirmed by MRI report dated on 04/21/17.  Hx of complete tear of rotator cuff left shoulder. Had done surgery 3 weeks ago. Still on protective arm sling.  Review of Systems  Constitutional: Negative for chills, fever and weight loss.  HENT: Negative for ear discharge, ear pain, hearing loss and tinnitus.   Eyes: Negative for blurred vision, double vision, photophobia, pain and discharge.  Respiratory: Negative for cough, shortness of breath and wheezing.   Cardiovascular: Negative for chest pain, palpitations, orthopnea and claudication.  Gastrointestinal: Negative for abdominal pain, heartburn, nausea and vomiting.  Genitourinary: Negative for dysuria, frequency, hematuria and urgency.  Musculoskeletal: Negative for back pain, joint pain and neck pain.  Skin: Positive for rash.  Neurological: Negative for dizziness, tingling, tremors, sensory change, weakness and headaches.   OBJECTIVE: DERMATOLOGIC EXAMINATION: Red reactive hyperemia right great toe. Mildly enlarged right great toe.  VASCULAR EXAMINATION OF LOWER LIMBS: All pedal pulses are palpable with normal pulsation.  Capillary Filling times within 3 seconds in all digits.  Cool temperature on digits of both feet.  NEUROLOGIC EXAMINATION OF THE LOWER LIMBS: All epicritic and tactile sensations grossly intact. Sharp and Dull discriminatory sensations at the plantar ball of  hallux is intact bilateral.   MUSCULOSKELETAL EXAMINATION: Positive for excess sagittal plane motion of the first metatarsal L>R. Limited or minimum joint motion available at IPJ right great toe with normal motion on left great toe IPJ.  ASSESSMENT: Fibrosis IPJ right hallux, post injury. R/O Arthritic change IPJ right hallux. Healed fracture site right great toe, head of proximal phalanx. Pain with ambulation right great toe.  PLAN: Old x-ray and MRI report reviewed with patient. Assured that the old fracture site has healed but with limited IPJ joint motion. Continue current level of care with flat post op shoe as tolerated. If pain continues and fail to heal, may discuss possible fusion of the IPJ joint.  Return as needed.

## 2017-06-19 DIAGNOSIS — M19012 Primary osteoarthritis, left shoulder: Secondary | ICD-10-CM | POA: Diagnosis not present

## 2017-07-02 ENCOUNTER — Ambulatory Visit: Payer: BLUE CROSS/BLUE SHIELD | Admitting: Rehabilitative and Restorative Service Providers"

## 2017-07-07 ENCOUNTER — Encounter: Payer: Self-pay | Admitting: Rehabilitative and Restorative Service Providers"

## 2017-07-07 ENCOUNTER — Ambulatory Visit (INDEPENDENT_AMBULATORY_CARE_PROVIDER_SITE_OTHER): Payer: BLUE CROSS/BLUE SHIELD | Admitting: Rehabilitative and Restorative Service Providers"

## 2017-07-07 DIAGNOSIS — R531 Weakness: Secondary | ICD-10-CM

## 2017-07-07 DIAGNOSIS — R293 Abnormal posture: Secondary | ICD-10-CM

## 2017-07-07 DIAGNOSIS — M25512 Pain in left shoulder: Secondary | ICD-10-CM | POA: Diagnosis not present

## 2017-07-07 DIAGNOSIS — R29898 Other symptoms and signs involving the musculoskeletal system: Secondary | ICD-10-CM

## 2017-07-07 NOTE — Patient Instructions (Addendum)
Axial Extension (Chin Tuck)    Pull chin in and lengthen back of neck. Hold __5__ seconds while counting out loud. Repeat __10__ times. Do __several__ sessions per day.  Shoulder Blade Squeeze    Rotate shoulders back, then squeeze shoulder blades together. Repeat ____ times. Do ____ sessions per day.    ROM: External / Internal Rotation - Wand standing     Holding wand with left hand palm up, push out from body with other hand, palm down. Keep both elbows bent. When stretch is felt, hold __10__ seconds.Keep elbows bent. Repeat _5-10___ times per set. Do _1___ sets per session. Do __3-4__ sessions per day.    ROM: Flexion    Keeping left arm on table, slide body away until stretch is felt. Hold _10___ seconds. Repeat __10__ times per set. Do _1___ sets per session. Do _3-4___ sessions per day.   Pendulum Circular    Bend forward 90 at waist, leaning on table for support. Rock body in a circular pattern to move arm clockwise __30__ times then counterclockwise _30___ times. Do __several__ sessions per day.

## 2017-07-07 NOTE — Therapy (Addendum)
[place], [place], [address] Phone: [phone]   Fax:  [phone]  Physical Therapy Evaluation  Patient Details  Name: Kimberly Wade MRN: [id number] Date of Birth: 06-Sep-1970 Referring Provider: Dr Cresenciano Lick Griffin Basil   Encounter Date: 07/07/2017  PT End of Session - 07/07/17 1256    Visit Number  1    Number of Visits  24    Date for PT Re-Evaluation  09/22/17       Past Medical History:  Diagnosis Date  . Anemia   . Anxiety   . Arthritis   . Chronic pain    has pain contract for Norco 5/325mg   . GERD (gastroesophageal reflux disease)   . Hyperlipidemia   . Hypertension   . Insomnia   . Pneumococcal pneumonia ([place]) 2000  . Pneumonia     Past Surgical History:  Procedure Laterality Date  . CESAREAN SECTION     x4  . CHEST TUBE INSERTION    . chest tubes  2000  . dysplastic mole removed     right ankle  . GASTRIC BYPASS    . INTRAUTERINE DEVICE (IUD) INSERTION N/A 07/11/2014   Procedure: INTRAUTERINE DEVICE (IUD) INSERTION;  Surgeon: Emily Filbert, MD;  Location: [facility];  Service: Gynecology;  Laterality: N/A;  . IUD REMOVAL N/A 07/11/2014   Procedure: INTRAUTERINE DEVICE (IUD) REMOVAL;  Surgeon: Emily Filbert, MD;  Location: [facility];  Service: Gynecology;  Laterality: N/A;  . mini gastric bypass  2011  . recoonstruction of right lung    . SHOULDER ARTHROSCOPY WITH SUBACROMIAL DECOMPRESSION, ROTATOR CUFF REPAIR AND BICEP TENDON REPAIR Left 05/20/2017   Procedure: LEFT SHOULDER ARTHROSCOPY WITH DEBRIDEMENT, ACROMIOPLASTY, DISTAL CLAVICAL EXCISION, ROTATOR CUFF REPAIR, OPEN BICEP TENODESIS;  Surgeon: Hiram Gash, MD;  Location: [facility];  Service: Orthopedics;  Laterality: Left;  [name] VIDEO BRONCHOSCOPY Bilateral 04/20/2013   Procedure: VIDEO BRONCHOSCOPY WITH FLUORO;  Surgeon: Elsie Stain, MD;  Location: [facility];  Service: Cardiopulmonary;  Laterality: Bilateral;     There were no vitals filed for this visit.   Subjective Assessment - 07/07/17 1114    Subjective  Patient reports that she underwent Lt shoulder surgery 05/20/17 - RCR; SAD; DCR; biceps tenodesis. She continues to have post op pain in the Lt shoulder.     Pertinent History  Patient reports history of Lt shoulder pain and dysfunction for ~ 3 months; fx Rt foot 5/18    How long can you sit comfortably?  1 hour     How long can you stand comfortably?  1 hour     How long can you walk comfortably?  20 min     Diagnostic tests  xray; MRI    Patient Stated Goals  get shoulder moving     Currently in Pain?  Yes    Pain Score  2     Pain Location  Shoulder    Pain Orientation  Left    Pain Descriptors / Indicators  Sharp Quick with movement and sometimes at rest     Pain Type  Surgical pain;Acute pain    Pain Radiating Towards  elbow to base of neck at shoulder     Pain Onset  More than a month ago    Pain Frequency  Intermittent    Aggravating Factors   sudden movements; prolonged positions; using or moving Lt UE away from body     Pain Relieving Factors  rest;  ice          [employer] PT Assessment - 07/07/17 0001      Assessment   Medical Diagnosis  Lt DCR; SAD; RCR; biceps tenodesis     Referring Provider  Dr Cresenciano Lick Griffin Basil    Onset Date/Surgical Date  05/20/17    Hand Dominance  Right    Next MD Visit  11/18    Prior Therapy  yes here 3 visits       Precautions   Precautions  None      Balance Screen   Has the patient fallen in the past 6 months  No    Has the patient had a decrease in activity level because of a fear of falling?   No    Is the patient reluctant to leave their home because of a fear of falling?   No      Home Film/video editor residence    Living Arrangements  Spouse/significant other;Children      Prior Function   Level of Independence  Independent    Vocation  Full time employment    Vocation Requirements  computer desk work; phone  in a bank  ~ 78 yrs     Leisure  household chores; child care      Observation/Other Assessments   Focus on Therapeutic Outcomes (FOTO)   84% limitation       Sensation   Additional Comments  WFL's per pt report       Posture/Postural Control   Posture Comments  head forward; shoudlers rounded and elevated; scapulae abducted and rotated along the thoracic wall       AROM   Right/Left Shoulder  -- Lt not tested actively - MD order for passive ROM only     Right Shoulder Extension  54 Degrees    Right Shoulder Flexion  153 Degrees    Right Shoulder ABduction  160 Degrees    Right Shoulder Internal Rotation  30 Degrees    Right Shoulder External Rotation  90 Degrees      Strength   Right/Left Shoulder  -- Rt UE grossly 5/5 not tested Lt       Palpation   Palpation comment  tenderness and tightness noted Lt shoulder girdle              Objective measurements completed on examination: See above findings.      [facility] Adult PT Treatment/Exercise - 07/07/17 0001      Neuro Re-ed    Neuro Re-ed Details   postural correction working on thoracic posture engaging posterior shoulder girdle musculature       Shoulder Exercises: Standing   Other Standing Exercises  axial extension 10 sec x 5; scap squeeze with noodle 10 sec x 5       Shoulder Exercises: ROM/Strengthening   Pendulum  30 CW/30 CCW instruction in technique bending forward from waist and moving body to move arm       Shoulder Exercises: Stretch   External Rotation Stretch  5 reps;10 seconds standing with cane elbow in neutral at side     Table Stretch - Flexion  5 reps;10 seconds      Electrical Stimulation   Electrical Stimulation Location  Lt shoulder     Electrical Stimulation Action  IFC    Electrical Stimulation Parameters  to tolerance    Electrical Stimulation Goals  Pain;Tone      Vasopneumatic   Number Minutes Vasopneumatic  15 minutes    Vasopnuematic Location   Shoulder Lt     Vasopneumatic  Pressure  Medium    Vasopneumatic Temperature   34 deg              PT Education - 07/07/17 1140    Education provided  Yes    Education Details  HEP     Person(s) Educated  Patient    Methods  Explanation;Demonstration;Tactile cues;Verbal cues;Handout    Comprehension  Verbalized understanding;Returned demonstration;Verbal cues required;Tactile cues required       PT Short Term Goals - 07/07/17 1331      PT SHORT TERM GOAL #1   Title  Improve posture and alignment with more upright thoracic posture with improved scapular position with posterior shoulder girdle musculature engaged 08/19/17    Time  6    Period  Weeks    Status  New      PT SHORT TERM GOAL #2   Title  Progress with PROM Lt shoulder to patient and tissue tolerance with 50-75% of normal ROM Lt shoulder passively 08/19/17    Time  6    Period  Weeks    Status  New      PT SHORT TERM GOAL #3   Title  Patient independent in initial HEP 08/19/17    Time  6    Period  Weeks    Status  New        PT Long Term Goals - 07/07/17 1334      PT LONG TERM GOAL #1   Title  Progress with exercise - ROM and strengthening per MD protocol for shoulder rehab 09/22/17    Time  12    Period  Weeks    Status  New      PT LONG TERM GOAL #2   Title  improve function Lt UE allowing patient to use Lt UE for normal functional activities 09/22/17    Time  12    Period  Weeks    Status  New      PT LONG TERM GOAL #3   Title  Perform Lt shoulder exercise and functional activities with minimal pain 09/22/17    Time  12    Period  Weeks    Status  New      PT LONG TERM GOAL #4   Title  Independent in HEP prior to discharge 09/22/17    Time  12    Period  Weeks    Status  New      PT LONG TERM GOAL #5   Title  Improve FOTO to </= 41 % limitation 09/22/17    Time  [place] - 07/07/17 1303    Clinical Impression Statement  Kimberly Wade presents s/p Lt shoulder RCR; [place]; SAD; biceps  tenodesis 05/20/17. She continues to have some post op pain. She presents with poor posture and alignment; limited mobility and strength Lt UE; muscular tightness and tenderness to palpation through the Lt shoulder girdle; limited functional activities. She will benefit from PT to address problems identified and return to normal functional activity level including return to work.     History and Personal Factors relevant to plan of care:  history of Lt shoulder pain and dysfunction for 3+ months prior to surgery - unknown cause     Clinical Presentation  Stable  Clinical Decision Making  Low    Rehab Potential  Good    PT Frequency  2x / week    PT Duration  12 weeks    PT Treatment/Interventions  Patient/family education;ADLs/Self Care Home Management;Electrical Stimulation;Cryotherapy;Iontophoresis 4mg /ml Dexamethasone;Moist Heat;Ultrasound;Dry needling;Manual techniques;Neuromuscular re-education;Therapeutic activities;Therapeutic exercise    PT Next Visit Plan  review HEP; begin manual work and PROM/stretching- PROM per MD protocol; modalities as indicated     Consulted and Agree with Plan of Care  Patient       Patient will benefit from skilled therapeutic intervention in order to improve the following deficits and impairments:  Postural dysfunction, Improper body mechanics, Pain, Impaired UE functional use, Increased fascial restricitons, Increased muscle spasms, Decreased mobility, Decreased range of motion, Decreased strength, Decreased activity tolerance  Visit Diagnosis: Acute pain of left shoulder - Plan: PT plan of care cert/re-cert  Weakness generalized - Plan: PT plan of care cert/re-cert  Other symptoms and signs involving the musculoskeletal system - Plan: PT plan of care cert/re-cert  Abnormal posture - Plan: PT plan of care cert/re-cert     Problem List Patient Active Problem List   Diagnosis Date Noted  . Biceps muscle tear, left, subsequent encounter 06/08/2017  .  Complete tear of left rotator cuff 06/08/2017  . Acute shoulder bursitis, left 04/03/2017  . Overweight (BMI 25.0-29.9) 02/01/2017  . 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 Influenza-like illness 08/29/2016  . Generalized anxiety disorder 08/09/2016  . Low back pain without sciatica 08/08/2016  . Right trigger finger 05/05/2016  . Right hand pain 05/05/2016  . Anemia 10/26/2015  . Metrorrhagia 09/18/2015  . Ovarian cyst, right 09/14/2015  . Tension headache 08/08/2014  . Primary osteoarthritis of right hand 02/02/2014  . Aspiration into lower respiratory tract 05/02/2013  . Insomnia 12/24/2012  . Depression 12/24/2012  . Migraine 12/24/2012  . Hyperlipidemia 12/24/2012  . Vitamin D deficiency 12/24/2012  . Essential hypertension, benign 12/24/2012  . Anxiety 12/24/2012  . S/P gastric bypass 12/24/2012  . Increased PTH level 12/24/2012  . GERD (gastroesophageal reflux disease) 12/24/2012    Nathanael Krist Nilda Simmer PT, MPH  07/07/2017, 5:00 PM  [facility] [place], [place], [address] Phone: [phone]   Fax:  [phone]  Name: Kimberly Wade MRN: [id number] Date of Birth: 03-25-1971

## 2017-07-13 ENCOUNTER — Encounter: Payer: BLUE CROSS/BLUE SHIELD | Admitting: Physical Therapy

## 2017-07-15 ENCOUNTER — Encounter: Payer: BLUE CROSS/BLUE SHIELD | Admitting: Rehabilitative and Restorative Service Providers"

## 2017-07-20 ENCOUNTER — Encounter: Payer: BLUE CROSS/BLUE SHIELD | Admitting: Physical Therapy

## 2017-07-21 ENCOUNTER — Telehealth: Payer: Self-pay | Admitting: *Deleted

## 2017-07-21 ENCOUNTER — Encounter: Payer: BLUE CROSS/BLUE SHIELD | Admitting: Physician Assistant

## 2017-07-21 DIAGNOSIS — E559 Vitamin D deficiency, unspecified: Secondary | ICD-10-CM | POA: Diagnosis not present

## 2017-07-21 DIAGNOSIS — Z131 Encounter for screening for diabetes mellitus: Secondary | ICD-10-CM

## 2017-07-21 DIAGNOSIS — D649 Anemia, unspecified: Secondary | ICD-10-CM

## 2017-07-21 DIAGNOSIS — E785 Hyperlipidemia, unspecified: Secondary | ICD-10-CM

## 2017-07-21 DIAGNOSIS — Z0189 Encounter for other specified special examinations: Secondary | ICD-10-CM

## 2017-07-21 NOTE — Telephone Encounter (Signed)
Fasting labs ordered

## 2017-07-22 ENCOUNTER — Ambulatory Visit (INDEPENDENT_AMBULATORY_CARE_PROVIDER_SITE_OTHER): Payer: BLUE CROSS/BLUE SHIELD | Admitting: Rehabilitative and Restorative Service Providers"

## 2017-07-22 ENCOUNTER — Encounter: Payer: Self-pay | Admitting: Rehabilitative and Restorative Service Providers"

## 2017-07-22 DIAGNOSIS — R531 Weakness: Secondary | ICD-10-CM | POA: Diagnosis not present

## 2017-07-22 DIAGNOSIS — M25512 Pain in left shoulder: Secondary | ICD-10-CM | POA: Diagnosis not present

## 2017-07-22 DIAGNOSIS — R293 Abnormal posture: Secondary | ICD-10-CM | POA: Diagnosis not present

## 2017-07-22 DIAGNOSIS — R29898 Other symptoms and signs involving the musculoskeletal system: Secondary | ICD-10-CM

## 2017-07-22 LAB — COMPLETE METABOLIC PANEL WITH GFR
AG Ratio: 1.5 (calc) (ref 1.0–2.5)
ALT: 18 U/L (ref 6–29)
AST: 16 U/L (ref 10–35)
Albumin: 3.7 g/dL (ref 3.6–5.1)
Alkaline phosphatase (APISO): 50 U/L (ref 33–115)
BUN: 13 mg/dL (ref 7–25)
CALCIUM: 8.9 mg/dL (ref 8.6–10.2)
CHLORIDE: 104 mmol/L (ref 98–110)
CO2: 29 mmol/L (ref 20–32)
Creat: 0.53 mg/dL (ref 0.50–1.10)
GFR, EST AFRICAN AMERICAN: 132 mL/min/{1.73_m2} (ref >=60)
GFR, Est Non African American: 114 mL/min/{1.73_m2} (ref >=60)
GLUCOSE: 88 mg/dL (ref 65–99)
Globulin: 2.4 g/dL (calc) (ref 1.9–3.7)
Potassium: 3.8 mmol/L (ref 3.5–5.3)
SODIUM: 140 mmol/L (ref 135–146)
Total Bilirubin: 0.4 mg/dL (ref 0.2–1.2)
Total Protein: 6.1 g/dL (ref 6.1–8.1)

## 2017-07-22 LAB — CBC WITH DIFFERENTIAL/PLATELET
Basophils Absolute: 42 cells/uL (ref 0–200)
Basophils Relative: 0.7 %
Eosinophils Absolute: 192 cells/uL (ref 15–500)
Eosinophils Relative: 3.2 %
HCT: 34.2 % — ABNORMAL LOW (ref 35.0–45.0)
Hemoglobin: 11.7 g/dL (ref 11.7–15.5)
Lymphs Abs: 1248 cells/uL (ref 850–3900)
MCH: 31.5 pg (ref 27.0–33.0)
MCHC: 34.2 g/dL (ref 32.0–36.0)
MCV: 92.2 fL (ref 80.0–100.0)
MPV: 10.6 fL (ref 7.5–12.5)
Monocytes Relative: 8.3 %
NEUTROS ABS: 4020 {cells}/uL (ref 1500–7800)
NEUTROS PCT: 67 %
PLATELETS: 182 10*3/uL (ref 140–400)
RBC: 3.71 10*6/uL — ABNORMAL LOW (ref 3.80–5.10)
RDW: 11.8 % (ref 11.0–15.0)
TOTAL LYMPHOCYTE: 20.8 %
WBC: 6 10*3/uL (ref 3.8–10.8)
WBCMIX: 498 {cells}/uL (ref 200–950)

## 2017-07-22 LAB — LIPID PANEL W/REFLEX DIRECT LDL
CHOL/HDL RATIO: 2.6 (calc) (ref <5.0)
Cholesterol: 161 mg/dL (ref <200)
HDL: 61 mg/dL (ref >50)
LDL CHOLESTEROL (CALC): 83 mg/dL
Non-HDL Cholesterol (Calc): 100 mg/dL (calc) (ref <130)
Triglycerides: 83 mg/dL (ref <150)

## 2017-07-22 LAB — VITAMIN D 25 HYDROXY (VIT D DEFICIENCY, FRACTURES): VIT D 25 HYDROXY: 40 ng/mL (ref 30–100)

## 2017-07-22 NOTE — Telephone Encounter (Signed)
Call pt: kidney, liver, glucose look great.  Cholesterol looks fantastic.  Vitamin D with significant improvement.  Normal hgb but on low normal. Increase iron rich foods.

## 2017-07-22 NOTE — Patient Instructions (Signed)
ELBOW: Biceps - Standing    Standing at table or counter, place left hand on surfacel, elbow straight. step forward feel gentle stretch Hold _20-30__ seconds. __3_ reps per set, __3-4_ sets per day   Lying on back stretch left arm out with elbow straight bend and straighten wrist and fingers 10-15 times   [place]ley over the door to help with stretching arm straight up by your ear.  Hold 10 sec x 10 reps  3-4 times/day

## 2017-07-22 NOTE — Therapy (Signed)
[place], [place], [address] Phone: [phone]   Fax:  [phone]  Physical Therapy Treatment  Patient Details  Name: Kimberly Wade MRN: [id number] Date of Birth: 09/26/1970 Referring Provider: Dr Cresenciano Lick Griffin Basil   Encounter Date: 07/22/2017  PT End of Session - 07/22/17 1114    Visit Number  2    Number of Visits  24    Date for PT Re-Evaluation  09/22/17    PT Start Time  1106    PT Stop Time  1205    PT Time Calculation (min)  59 min    Activity Tolerance  Patient tolerated treatment well       Past Medical History:  Diagnosis Date  . Anemia   . Anxiety   . Arthritis   . Chronic pain    has pain contract for Norco 5/325mg   . GERD (gastroesophageal reflux disease)   . Hyperlipidemia   . Hypertension   . Insomnia   . Pneumococcal pneumonia ([place]) 2000  . Pneumonia     Past Surgical History:  Procedure Laterality Date  . CESAREAN SECTION     x4  . CHEST TUBE INSERTION    . chest tubes  2000  . dysplastic mole removed     right ankle  . GASTRIC BYPASS    . INTRAUTERINE DEVICE (IUD) INSERTION N/A 07/11/2014   Procedure: INTRAUTERINE DEVICE (IUD) INSERTION;  Surgeon: Emily Filbert, MD;  Location: [facility];  Service: Gynecology;  Laterality: N/A;  . IUD REMOVAL N/A 07/11/2014   Procedure: INTRAUTERINE DEVICE (IUD) REMOVAL;  Surgeon: Emily Filbert, MD;  Location: [facility];  Service: Gynecology;  Laterality: N/A;  . mini gastric bypass  2011  . recoonstruction of right lung    . SHOULDER ARTHROSCOPY WITH SUBACROMIAL DECOMPRESSION, ROTATOR CUFF REPAIR AND BICEP TENDON REPAIR Left 05/20/2017   Procedure: LEFT SHOULDER ARTHROSCOPY WITH DEBRIDEMENT, ACROMIOPLASTY, DISTAL CLAVICAL EXCISION, ROTATOR CUFF REPAIR, OPEN BICEP TENODESIS;  Surgeon: Hiram Gash, MD;  Location: [facility];  Service: Orthopedics;  Laterality: Left;  [name] VIDEO BRONCHOSCOPY Bilateral 04/20/2013   Procedure:  VIDEO BRONCHOSCOPY WITH FLUORO;  Surgeon: Elsie Stain, MD;  Location: [facility];  Service: Cardiopulmonary;  Laterality: Bilateral;    There were no vitals filed for this visit.  Subjective Assessment - 07/22/17 1116    Subjective  Having trouble remembering her therapy appointments. Has been doing her exercises at home - biceps really still hurts     Currently in Pain?  Yes    Pain Score  4     Pain Location  Shoulder    Pain Orientation  Left    Pain Descriptors / Indicators  Sharp    Pain Type  Surgical pain;Chronic pain;Acute pain    Pain Onset  More than a month ago    Pain Frequency  Intermittent         [facility] PT Assessment - 07/22/17 0001      Assessment   Medical Diagnosis  Lt DCR; SAD; RCR; biceps tenodesis     Referring Provider  Dr Cresenciano Lick Griffin Basil    Onset Date/Surgical Date  05/20/17    Hand Dominance  Right    Next MD Visit  11/18    Prior Therapy  yes here 3 visits       PROM   Right/Left Shoulder  -- assessed w/ patient in supine - shd in scaption all painful    Left Shoulder Flexion  91 Degrees    Left Shoulder ABduction  84 Degrees    Left Shoulder External Rotation  4 Degrees elbow in neutral shd in scaption       Palpation   Palpation comment  tenderness and tightness noted Lt biceps/anterior shoulder through pecs and deltoid; shoulder girdle                   [facility] Adult PT Treatment/Exercise - 07/22/17 0001      Shoulder Exercises: Supine   Other Supine Exercises  elbow extension in supported scaption; wrist flexion and extension in elbow extension       Shoulder Exercises: Standing   Other Standing Exercises  axial extension 10 sec x 5; scap squeeze with noodle 10 sec x 5       Shoulder Exercises: ROM/Strengthening   Pendulum  30 CW/30 CCW instruction in technique bending forward from waist and moving body to move arm  several times during treatment       Shoulder Exercises: Stretch   External Rotation Stretch  5 reps;10 seconds  standing with cane elbow in neutral at side     Table Stretch - Flexion  5 reps;10 seconds    Other Shoulder Stretches  biceps stretch 20 sec x 3 at table       Acupuncturist Location  Lt shoulder     Electrical Stimulation Action  IFC    Electrical Stimulation Parameters  to tolerance    Electrical Stimulation Goals  Pain;Tone      Ultrasound   Ultrasound Location  Lt biceps/anterior deltoid     Ultrasound Parameters  1.2 w/cm2; 1 mHz; 100%; 8 min     Ultrasound Goals  Pain;Other (Comment) muscle tightness       Vasopneumatic   Number Minutes Vasopneumatic   15 minutes    Vasopnuematic Location   Shoulder Lt     Vasopneumatic Pressure  Low    Vasopneumatic Temperature   34 deg       Manual Therapy   Manual therapy comments  pt supine Lt UE supported on scaption    Joint Mobilization  gentle circumduction Lt GH joint     Soft tissue mobilization  to tolerance through Lt biceps; anterior deltoid; pecs; upper trap; leveator; periscapular musculature     Myofascial Release  biceps/anterior shoulder     Scapular Mobilization  Lt     Passive ROM  Lt shoulder flexion/scaption/ER at neutral - pt very guarded with limited ROM              PT Education - 07/22/17 1203    Education provided  Yes    Education Details  HEP     Person(s) Educated  Patient    Methods  Explanation;Demonstration;Tactile cues;Verbal cues;Handout    Comprehension  Verbalized understanding;Returned demonstration;Verbal cues required;Tactile cues required       PT Short Term Goals - 07/22/17 1115      PT SHORT TERM GOAL #1   Title  Improve posture and alignment with more upright thoracic posture with improved scapular position with posterior shoulder girdle musculature engaged 08/19/17    Time  6    Period  Weeks    Status  On-going      PT SHORT TERM GOAL #2   Title  Progress with PROM Lt shoulder to patient and tissue tolerance with 50-75% of normal ROM Lt shoulder  passively 08/19/17    Time  6  Period  Weeks    Status  On-going      PT SHORT TERM GOAL #3   Title  Patient independent in initial HEP 08/19/17    Time  6    Period  Weeks    Status  On-going        PT Long Term Goals - 07/22/17 1114      PT LONG TERM GOAL #1   Title  Progress with exercise - ROM and strengthening per MD protocol for shoulder rehab 09/22/17    Time  12    Period  Weeks    Status  On-going      PT LONG TERM GOAL #2   Title  improve function Lt UE allowing patient to use Lt UE for normal functional activities 09/22/17    Time  12    Period  Weeks    Status  On-going      PT LONG TERM GOAL #3   Title  Perform Lt shoulder exercise and functional activities with minimal pain 09/22/17    Time  12    Period  Weeks    Status  On-going      PT LONG TERM GOAL #4   Title  Independent in HEP prior to discharge 09/22/17    Time  12    Period  Weeks    Status  On-going      PT LONG TERM GOAL #5   Title  Improve FOTO to </= 41 % limitation 09/22/17    Time  12    Period  Weeks    Status  On-going            Plan - 07/22/17 1117    Clinical Impression Statement  Kimberly Wade has not bee to PT since initial appointment 07/07/17. She continues to hold Lt UE in guarded position with shoulder elevated; elbow flexed; forearm across stomach. Tightness to palpation through shoulder girdle and biceps. Concerned with development of adhesive capsulitis. Encouraged patient to work consistently with HEP and attend therapy bas scheduled. No goals accomplished.     Rehab Potential  Good    PT Frequency  2x / week    PT Duration  12 weeks    PT Treatment/Interventions  Patient/family education;ADLs/Self Care Home Management;Electrical Stimulation;Cryotherapy;Iontophoresis 4mg /ml Dexamethasone;Moist Heat;Ultrasound;Dry needling;Manual techniques;Neuromuscular re-education;Therapeutic activities;Therapeutic exercise    PT Next Visit Plan  review HEP; begin manual work and PROM/stretching-  PROM per MD protocol; modalities as indicated     Consulted and Agree with Plan of Care  Patient       Patient will benefit from skilled therapeutic intervention in order to improve the following deficits and impairments:  Postural dysfunction, Improper body mechanics, Pain, Impaired UE functional use, Increased fascial restricitons, Increased muscle spasms, Decreased mobility, Decreased range of motion, Decreased strength, Decreased activity tolerance  Visit Diagnosis: Acute pain of left shoulder  Weakness generalized  Other symptoms and signs involving the musculoskeletal system  Abnormal posture     Problem List Patient Active Problem List   Diagnosis Date Noted  . Biceps muscle tear, left, subsequent encounter 06/08/2017  . Complete tear of left rotator cuff 06/08/2017  . Acute shoulder bursitis, left 04/03/2017  . Overweight (BMI 25.0-29.9) 02/01/2017  . 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 Influenza-like illness 08/29/2016  . Generalized anxiety disorder 08/09/2016  . Low back pain without sciatica 08/08/2016  . Right trigger finger 05/05/2016  . Right hand pain 05/05/2016  .  Anemia 10/26/2015  . Metrorrhagia 09/18/2015  . Ovarian cyst, right 09/14/2015  . Tension headache 08/08/2014  . Primary osteoarthritis of right hand 02/02/2014  . Aspiration into lower respiratory tract 05/02/2013  . Insomnia 12/24/2012  . Depression 12/24/2012  . Migraine 12/24/2012  . Hyperlipidemia 12/24/2012  . Vitamin D deficiency 12/24/2012  . Essential hypertension, benign 12/24/2012  . Anxiety 12/24/2012  . S/P gastric bypass 12/24/2012  . Increased PTH level 12/24/2012  . GERD (gastroesophageal reflux disease) 12/24/2012    Amiel Mccaffrey Nilda Simmer PT, MPH  07/22/2017, 12:25 PM  [facility] [place], [place], [address] Phone: [phone]   Fax:   [phone]  Name: Kimberly Wade MRN: [id number] Date of Birth: [birth year]

## 2017-07-24 ENCOUNTER — Ambulatory Visit (INDEPENDENT_AMBULATORY_CARE_PROVIDER_SITE_OTHER): Payer: BLUE CROSS/BLUE SHIELD | Admitting: Physical Therapy

## 2017-07-24 DIAGNOSIS — R293 Abnormal posture: Secondary | ICD-10-CM | POA: Diagnosis not present

## 2017-07-24 DIAGNOSIS — R29898 Other symptoms and signs involving the musculoskeletal system: Secondary | ICD-10-CM

## 2017-07-24 DIAGNOSIS — R531 Weakness: Secondary | ICD-10-CM | POA: Diagnosis not present

## 2017-07-24 DIAGNOSIS — M25612 Stiffness of left shoulder, not elsewhere classified: Secondary | ICD-10-CM

## 2017-07-24 DIAGNOSIS — M25512 Pain in left shoulder: Secondary | ICD-10-CM

## 2017-07-24 NOTE — Patient Instructions (Signed)

## 2017-07-24 NOTE — Therapy (Addendum)
[facility] [place], [place], [address] Phone: [phone]   Fax:  [phone]  Physical Therapy Treatment  Patient Details  Name: Kimberly Wade MRN: [id number] Date of Birth: 11/08/1970 Referring Provider: Dr Cresenciano Lick Griffin Basil   Encounter Date: 07/24/2017  PT End of Session - 07/24/17 1242    Visit Number  3    Number of Visits  24    Date for PT Re-Evaluation  09/22/17    PT Start Time  1146    PT Stop Time  1245    PT Time Calculation (min)  59 min    Activity Tolerance  Patient tolerated treatment well    Behavior During Therapy  [facility] for tasks assessed/performed       Past Medical History:  Diagnosis Date  . Anemia   . Anxiety   . Arthritis   . Chronic pain    has pain contract for Norco 5/374m  . GERD (gastroesophageal reflux disease)   . Hyperlipidemia   . Hypertension   . Insomnia   . Pneumococcal pneumonia ([place] 2000  . Pneumonia     Past Surgical History:  Procedure Laterality Date  . CESAREAN SECTION     x4  . CHEST TUBE INSERTION    . chest tubes  2000  . dysplastic mole removed     right ankle  . GASTRIC BYPASS    . INTRAUTERINE DEVICE (IUD) INSERTION N/A 07/11/2014   Procedure: INTRAUTERINE DEVICE (IUD) INSERTION;  Surgeon: MEmily Filbert MD;  [facility];  Service: Gynecology;  Laterality: N/A;  . IUD REMOVAL N/A 07/11/2014   Procedure: INTRAUTERINE DEVICE (IUD) REMOVAL;  Surgeon: MEmily Filbert MD;  [facility];  Service: Gynecology;  Laterality: N/A;  . mini gastric bypass  2011  . recoonstruction of right lung    . SHOULDER ARTHROSCOPY WITH SUBACROMIAL DECOMPRESSION, ROTATOR CUFF REPAIR AND BICEP TENDON REPAIR Left 05/20/2017   Procedure: LEFT SHOULDER ARTHROSCOPY WITH DEBRIDEMENT, ACROMIOPLASTY, DISTAL CLAVICAL EXCISION, ROTATOR CUFF REPAIR, OPEN BICEP TENODESIS;  Surgeon: VHiram Gash MD;  [facility]  Service: Orthopedics;  Laterality:  Left;  .Marland KitchenVIDEO BRONCHOSCOPY Bilateral 04/20/2013   Procedure: VIDEO BRONCHOSCOPY WITH FLUORO;  Surgeon: PElsie Stain MD;  [facility];  Service: Cardiopulmonary;  Laterality: Bilateral;    There were no vitals filed for this visit.  Subjective Assessment - 07/24/17 1156    Subjective  Pt reports she is using her Lt arm for things like dishes, but no reaching activities (to shoulder height). She is no longer wearing her sling. She is doing HEP daily, on-off throughout day.  No new changes since last visit.     Patient Stated Goals  get shoulder moving     Currently in Pain?  Yes    Pain Score  2     Pain Location  Shoulder    Pain Orientation  Left    Aggravating Factors   moving Lt arm.     Pain Relieving Factors  resting arm at waist level         [facility] Adult PT Treatment/Exercise - 07/24/17 0001      Shoulder Exercises: Supine   Other Supine Exercises  elbow extension in supported scaption; wrist flexion and extension in elbow extension     Other Supine Exercises  scap retraction x 5 sec hold x 10 reps       Electrical Stimulation   Electrical Stimulation Location  Lt  shoulder    Electrical Stimulation Action  IFC    Electrical Stimulation Parameters  to tolerance     Electrical Stimulation Goals  Tone;Pain      Vasopneumatic   Number Minutes Vasopneumatic   20 minutes    Vasopnuematic Location   Shoulder Lt     Vasopneumatic Pressure  Low    Vasopneumatic Temperature   34 deg       Manual Therapy   Manual Therapy  Scapular mobilization;Myofascial release;Soft tissue mobilization;Taping    Manual therapy comments  pt supine Lt UE supported on scaption    Joint Mobilization  gentle circumduction Lt GH joint to decrease guarding     Soft tissue mobilization  to tolerance through Lt biceps; anterior deltoid; pecs; upper trap; levator; periscapular musculature     Myofascial Release  biceps/anterior shoulder     Scapular Mobilization  Lt     Passive ROM  Lt  shoulder flexion/scaption/ER at neutral - pt guarded with limited ROM     Kinesiotex  Create Space      Kinesiotix   Create Space  Blue argyle reg rock tape applied in zigzag pattern over Lt bicep scar to assist with scar managment.                PT Short Term Goals - 07/22/17 1115      PT SHORT TERM GOAL #1   Title  Improve posture and alignment with more upright thoracic posture with improved scapular position with posterior shoulder girdle musculature engaged 08/19/17    Time  6    Period  Weeks    Status  On-going      PT SHORT TERM GOAL #2   Title  Progress with PROM Lt shoulder to patient and tissue tolerance with 50-75% of normal ROM Lt shoulder passively 08/19/17    Time  6    Period  Weeks    Status  On-going      PT SHORT TERM GOAL #3   Title  Patient independent in initial HEP 08/19/17    Time  6    Period  Weeks    Status  On-going        PT Long Term Goals - 07/22/17 1114      PT LONG TERM GOAL #1   Title  Progress with exercise - ROM and strengthening per MD protocol for shoulder rehab 09/22/17    Time  12    Period  Weeks    Status  On-going      PT LONG TERM GOAL #2   Title  improve function Lt UE allowing patient to use Lt UE for normal functional activities 09/22/17    Time  12    Period  Weeks    Status  On-going      PT LONG TERM GOAL #3   Title  Perform Lt shoulder exercise and functional activities with minimal pain 09/22/17    Time  12    Period  Weeks    Status  On-going      PT LONG TERM GOAL #4   Title  Independent in HEP prior to discharge 09/22/17    Time  12    Period  Weeks    Status  On-going      PT LONG TERM GOAL #5   Title  Improve FOTO to </= 41 % limitation 09/22/17    Time  12    Period  Weeks    Status  On-going  Plan - 07/24/17 1236    Clinical Impression Statement  Kimberly Wade had some guarding with PROM of Lt shoulder; slightly reduced as time went on and was able to achieve ~95 deg flexion. Trial of Rock  tape to Lt bicep incision to assist with scar management and decrease of sensitivity.  Pt gradually progressing towards goals, within given protocol.     Rehab Potential  Good    PT Frequency  2x / week    PT Duration  12 weeks    PT Treatment/Interventions  Patient/family education;ADLs/Self Care Home Management;Electrical Stimulation;Cryotherapy;Iontophoresis 69m/ml Dexamethasone;Moist Heat;Ultrasound;Dry needling;Manual techniques;Neuromuscular re-education;Therapeutic activities;Therapeutic exercise    PT Next Visit Plan   manual work and PROM/stretching- PROM per MD protocol; modalities as indicated     Consulted and Agree with Plan of Care  Patient       Patient will benefit from skilled therapeutic intervention in order to improve the following deficits and impairments:  Postural dysfunction, Improper body mechanics, Pain, Impaired UE functional use, Increased fascial restricitons, Increased muscle spasms, Decreased mobility, Decreased range of motion, Decreased strength, Decreased activity tolerance  Visit Diagnosis: Acute pain of left shoulder  Weakness generalized  Other symptoms and signs involving the musculoskeletal system  Abnormal posture  Stiffness of left shoulder, not elsewhere classified     Problem List Patient Active Problem List   Diagnosis Date Noted  . Biceps muscle tear, left, subsequent encounter 06/08/2017  . Complete tear of left rotator cuff 06/08/2017  . Acute shoulder bursitis, left 04/03/2017  . Overweight (BMI 25.0-29.9) 02/01/2017  . 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 Influenza-like illness 08/29/2016  . Generalized anxiety disorder 08/09/2016  . Low back pain without sciatica 08/08/2016  . Right trigger finger 05/05/2016  . Right hand pain 05/05/2016  . Anemia 10/26/2015  . Metrorrhagia 09/18/2015  . Ovarian cyst, right 09/14/2015  . Tension headache 08/08/2014  . Primary  osteoarthritis of right hand 02/02/2014  . Aspiration into lower respiratory tract 05/02/2013  . Insomnia 12/24/2012  . Depression 12/24/2012  . Migraine 12/24/2012  . Hyperlipidemia 12/24/2012  . Vitamin D deficiency 12/24/2012  . Essential hypertension, benign 12/24/2012  . Anxiety 12/24/2012  . S/P gastric bypass 12/24/2012  . Increased PTH level 12/24/2012  . GERD (gastroesophageal reflux disease) 12/24/2012   JKerin Perna PTA 07/24/17 12:44 PM  [address] [address]Phone: [phone]  Fax:  [phone] Name: Kimberly HajjarMRN: [id number] of Birth: [birth year] PHYSICAL THERAPY DISCHARGE SUMMARY  Visits from Start of Care: 3  Current functional level related to goals / functional outcomes: See last progress note for discharge status   Remaining deficits: Continued pain and limited mobility/functioin of Rt UE    Education / Equipment: HEP  Plan: Patient agrees to discharge.  Patient goals were not met. Patient is being discharged due to the patient's request.  ?????     Celyn P. HHelene KelpPT, MPH 09/30/17 3:01 PM

## 2017-07-27 ENCOUNTER — Encounter: Payer: BLUE CROSS/BLUE SHIELD | Admitting: Physical Therapy

## 2017-07-29 ENCOUNTER — Encounter: Payer: BLUE CROSS/BLUE SHIELD | Admitting: Rehabilitative and Restorative Service Providers"

## 2017-07-31 ENCOUNTER — Encounter: Payer: BLUE CROSS/BLUE SHIELD | Admitting: Physical Therapy

## 2017-08-06 ENCOUNTER — Encounter: Payer: BLUE CROSS/BLUE SHIELD | Admitting: Physical Therapy

## 2017-08-12 ENCOUNTER — Encounter: Payer: BLUE CROSS/BLUE SHIELD | Admitting: Physical Therapy

## 2017-08-13 ENCOUNTER — Other Ambulatory Visit: Payer: Self-pay | Admitting: Physician Assistant

## 2017-08-13 DIAGNOSIS — M25512 Pain in left shoulder: Secondary | ICD-10-CM

## 2017-08-13 DIAGNOSIS — S46212D Strain of muscle, fascia and tendon of other parts of biceps, left arm, subsequent encounter: Secondary | ICD-10-CM

## 2017-08-13 DIAGNOSIS — M75122 Complete rotator cuff tear or rupture of left shoulder, not specified as traumatic: Secondary | ICD-10-CM

## 2017-08-13 DIAGNOSIS — Z9889 Other specified postprocedural states: Secondary | ICD-10-CM

## 2017-08-13 NOTE — Telephone Encounter (Signed)
Pt called and left message stating she needs a refill on her Norco. Thanks

## 2017-08-13 NOTE — Telephone Encounter (Signed)
Routing to PCP for e-scribe. Refill date appropriate.

## 2017-08-14 MED ORDER — HYDROCODONE-ACETAMINOPHEN 5-325 MG PO TABS: 1.0000 | ORAL_TABLET | Freq: Four times a day (QID) | ORAL | 0 refills | Status: DC | PRN

## 2017-08-21 ENCOUNTER — Other Ambulatory Visit: Payer: Self-pay | Admitting: Physician Assistant

## 2017-08-21 DIAGNOSIS — I10 Essential (primary) hypertension: Secondary | ICD-10-CM

## 2017-09-18 ENCOUNTER — Other Ambulatory Visit: Payer: Self-pay | Admitting: *Deleted

## 2017-09-18 ENCOUNTER — Telehealth: Payer: Self-pay | Admitting: Family Medicine

## 2017-09-18 DIAGNOSIS — Z9889 Other specified postprocedural states: Secondary | ICD-10-CM

## 2017-09-18 DIAGNOSIS — M75122 Complete rotator cuff tear or rupture of left shoulder, not specified as traumatic: Secondary | ICD-10-CM

## 2017-09-18 DIAGNOSIS — S46212D Strain of muscle, fascia and tendon of other parts of biceps, left arm, subsequent encounter: Secondary | ICD-10-CM

## 2017-09-18 DIAGNOSIS — M25512 Pain in left shoulder: Secondary | ICD-10-CM

## 2017-09-18 MED ORDER — HYDROCODONE-ACETAMINOPHEN 5-325 MG PO TABS: 1.0000 | ORAL_TABLET | Freq: Two times a day (BID) | ORAL | 0 refills | Status: DC | PRN

## 2017-09-18 NOTE — Telephone Encounter (Signed)
Please call patient: She needs to schedule an appointment.  She was actually supposed to follow-up a week ago right around January 22 for her narcotic pain medications.  I did send over 6 tabs to get her through the weekend but she absolutely has to have an appointment with Luvenia Starch next week for follow-up.  Otherwise no more narcotics can be prescribed from our office.  They are supposed to consult to start weaning her off of her medication post surgery.

## 2017-09-18 NOTE — Telephone Encounter (Signed)
Pt left vm requesting a refill of her Hydrocodone.

## 2017-09-22 NOTE — Telephone Encounter (Signed)
Called patient and advised there will be no more refills due to it being a narcotic and that she needed a fu appointment with Luvenia Starch. Pt made a fu for 09/23/17.Thanks

## 2017-09-23 ENCOUNTER — Ambulatory Visit (INDEPENDENT_AMBULATORY_CARE_PROVIDER_SITE_OTHER): Payer: BLUE CROSS/BLUE SHIELD | Admitting: Physician Assistant

## 2017-09-23 ENCOUNTER — Encounter: Payer: Self-pay | Admitting: Physician Assistant

## 2017-09-23 VITALS — BP 113/63 | HR 77 | Ht 66.0 in | Wt 152.0 lb

## 2017-09-23 DIAGNOSIS — Z9889 Other specified postprocedural states: Secondary | ICD-10-CM

## 2017-09-23 DIAGNOSIS — H6982 Other specified disorders of Eustachian tube, left ear: Secondary | ICD-10-CM | POA: Diagnosis not present

## 2017-09-23 DIAGNOSIS — S46112D Strain of muscle, fascia and tendon of long head of biceps, left arm, subsequent encounter: Secondary | ICD-10-CM | POA: Diagnosis not present

## 2017-09-23 DIAGNOSIS — M25512 Pain in left shoulder: Secondary | ICD-10-CM

## 2017-09-23 DIAGNOSIS — S46212D Strain of muscle, fascia and tendon of other parts of biceps, left arm, subsequent encounter: Secondary | ICD-10-CM

## 2017-09-23 DIAGNOSIS — M75122 Complete rotator cuff tear or rupture of left shoulder, not specified as traumatic: Secondary | ICD-10-CM

## 2017-09-23 MED ORDER — HYDROCODONE-ACETAMINOPHEN 5-325 MG PO TABS: 1.0000 | ORAL_TABLET | Freq: Three times a day (TID) | ORAL | 0 refills | Status: DC

## 2017-09-23 MED ORDER — METHYLPREDNISOLONE 4 MG PO TBPK: ORAL_TABLET | ORAL | 0 refills | Status: DC

## 2017-09-23 NOTE — Progress Notes (Signed)
Subjective:    Patient ID: Kimberly Wade, female    DOB: Dec 06, 1970, 47 y.o.   MRN: [id number]  HPI  Pt is a 47 yo female with hx of recent repair of left rotator cuff/bicep surgery in October 2018 who remains on hydrocodone 2 to 3 times a day due to pain. She has been going to PT but last visit was 12/7 due to starting back to work. Her ROM and pain level are not where she would like them to be. Pain with movement and at the end of the day is 8/10. She is not able to lift her left arm above 90 degrees. She has no relief with mobic. She has been almost out of norco recently and pain is much worse and certainly effects her quality of life.   Pt also c/o left ear pain for last few days. No fever, chills, discharge from ear, sinus pressure, ST. Not tried anything to make better. Nothing seems to make worse.   .. Active Ambulatory Problems    Diagnosis Date Noted  . Insomnia 12/24/2012  . Depression 12/24/2012  . Migraine 12/24/2012  . Hyperlipidemia 12/24/2012  . Vitamin D deficiency 12/24/2012  . Essential hypertension, benign 12/24/2012  . Anxiety 12/24/2012  . S/P gastric bypass 12/24/2012  . Increased PTH level 12/24/2012  . GERD (gastroesophageal reflux disease) 12/24/2012  . Aspiration into lower respiratory tract 05/02/2013  . Primary osteoarthritis of right hand 02/02/2014  . Tension headache 08/08/2014  . Ovarian cyst, right 09/14/2015  . Metrorrhagia 09/18/2015  . Anemia 10/26/2015  . Right trigger finger 05/05/2016  . Right hand pain 05/05/2016  . Low back pain without sciatica 08/08/2016  . Generalized anxiety disorder 08/09/2016  . Influenza-like illness 08/29/2016  . 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 Overweight (BMI 25.0-29.9) 02/01/2017  . Acute shoulder bursitis, left 04/03/2017  . Biceps muscle tear, left, subsequent encounter 06/08/2017  . Complete tear of left rotator cuff 06/08/2017   Resolved  Ambulatory Problems    Diagnosis Date Noted  . Lingular pneumonia 12/24/2012  . Type 2 diabetes mellitus ([place]) 12/24/2012  . Sinusitis, chronic 12/24/2012  . Diabetic retinopathy ([place]) 12/24/2012  . Acute cystitis 06/18/2016   Past Medical History:  Diagnosis Date  . Anemia   . Anxiety   . Arthritis   . Chronic pain   . GERD (gastroesophageal reflux disease)   . Hyperlipidemia   . Hypertension   . Insomnia   . Pneumococcal pneumonia ([place]) 2000  . Pneumonia       Review of Systems See HPI.     Objective:   Physical Exam  Constitutional: She appears well-developed and well-nourished.  HENT:  Head: Normocephalic and atraumatic.  Right Ear: External ear normal.  Left Ear: External ear normal.  Nose: Nose normal.  Mouth/Throat: Oropharynx is clear and moist. No oropharyngeal exudate.  TM"s clear bilaterally.   Eyes: Conjunctivae are normal.  Neck: Normal range of motion. Neck supple.  Cardiovascular: Normal rate, regular rhythm and normal heart sounds.  Pulmonary/Chest: Effort normal and breath sounds normal.  Musculoskeletal:  Left shoulder:  No abduction above 90 degrees.  Tenderness anteriorly to palpation.  Strength2/5 left arm.   Lymphadenopathy:    She has no cervical adenopathy.          Assessment & Plan:   Marland KitchenMarland KitchenDiagnoses and all orders for this visit:  ETD (Eustachian tube dysfunction), left -     methylPREDNISolone (MEDROL DOSEPAK)  4 MG TBPK tablet; Take as directed by package insert.  Complete tear of left rotator cuff -     HYDROcodone-acetaminophen (NORCO/VICODIN) 5-325 MG tablet; Take 1 tablet by mouth 3 (three) times daily with meals. -     Drugs of abuse screen w/o alc (for [facility] OP)  Biceps muscle tear, left, subsequent encounter -     HYDROcodone-acetaminophen (NORCO/VICODIN) 5-325 MG tablet; Take 1 tablet by mouth 3 (three) times daily with meals. -     Drugs of abuse screen w/o alc (for BH OP)  Acute pain of left shoulder -      HYDROcodone-acetaminophen (NORCO/VICODIN) 5-325 MG tablet; Take 1 tablet by mouth 3 (three) times daily with meals. -     Drugs of abuse screen w/o alc (for BH OP)  S/P shoulder surgery -     HYDROcodone-acetaminophen (NORCO/VICODIN) 5-325 MG tablet; Take 1 tablet by mouth 3 (three) times daily with meals. -     Drugs of abuse screen w/o alc (for BH OP)    Last pain contract signed 11/2016. UDS ordered today. Pennsaid samples given today to try. Encouraged to get back in PT. Norco given today, discuss despite continual pain there is also risk for abuse/addiction.  Follow up in 3 months.   [place] controlled substance database not able to be viewed. Database would not let me in.   Ear pain seems to be consistent with ETD. Consider flonase for a few days. If not improving start medrol dose pack. Reassurance given no infection.   [name].Spent 30 minutes with patient and greater than 50 percent of visit spent counseling patient regarding treatment plan.

## 2017-09-24 DIAGNOSIS — M75122 Complete rotator cuff tear or rupture of left shoulder, not specified as traumatic: Secondary | ICD-10-CM | POA: Diagnosis not present

## 2017-09-24 DIAGNOSIS — S46112D Strain of muscle, fascia and tendon of long head of biceps, left arm, subsequent encounter: Secondary | ICD-10-CM | POA: Diagnosis not present

## 2017-09-24 DIAGNOSIS — Z9889 Other specified postprocedural states: Secondary | ICD-10-CM | POA: Diagnosis not present

## 2017-09-24 DIAGNOSIS — M25512 Pain in left shoulder: Secondary | ICD-10-CM | POA: Diagnosis not present

## 2017-09-25 ENCOUNTER — Encounter: Payer: Self-pay | Admitting: Physician Assistant

## 2017-09-29 LAB — DRUGS OF ABUSE SCREEN W/O ALC, ROUTINE URINE
AMPHETAMINES (1000 ng/mL SCRN): NEGATIVE
BARBITURATES: NEGATIVE
BENZODIAZEPINES: NEGATIVE
COCAINE METABOLITES: NEGATIVE
MARIJUANA MET (50 ng/mL SCRN): NEGATIVE
METHADONE: NEGATIVE
METHAQUALONE: NEGATIVE
OPIATES: NEGATIVE
PHENCYCLIDINE: NEGATIVE
PROPOXYPHENE: NEGATIVE

## 2017-10-04 ENCOUNTER — Other Ambulatory Visit: Payer: Self-pay | Admitting: Physician Assistant

## 2017-10-04 DIAGNOSIS — F330 Major depressive disorder, recurrent, mild: Secondary | ICD-10-CM

## 2017-10-04 DIAGNOSIS — I10 Essential (primary) hypertension: Secondary | ICD-10-CM

## 2017-10-06 ENCOUNTER — Encounter: Payer: Self-pay | Admitting: Sports Medicine

## 2017-10-06 ENCOUNTER — Ambulatory Visit (INDEPENDENT_AMBULATORY_CARE_PROVIDER_SITE_OTHER): Payer: BLUE CROSS/BLUE SHIELD | Admitting: Sports Medicine

## 2017-10-06 DIAGNOSIS — J0100 Acute maxillary sinusitis, unspecified: Secondary | ICD-10-CM | POA: Diagnosis not present

## 2017-10-06 LAB — POCT INFLUENZA A/B
Influenza A, POC: NEGATIVE
Influenza B, POC: NEGATIVE

## 2017-10-06 MED ORDER — PREDNISONE 50 MG PO TABS: 50.0000 mg | ORAL_TABLET | Freq: Every day | ORAL | 0 refills | Status: DC

## 2017-10-06 MED ORDER — AZITHROMYCIN 250 MG PO TABS: ORAL_TABLET | ORAL | 0 refills | Status: DC

## 2017-10-06 NOTE — Addendum Note (Signed)
Addended by: Elizabeth Sauer on: 10/06/2017 05:04 PM   Modules accepted: Orders

## 2017-10-06 NOTE — Patient Instructions (Signed)

## 2017-10-06 NOTE — Progress Notes (Signed)
Subjective:    CC: Feeling sick  HPI: This is a pleasant 47 year old female, healthy, for the past several days she has had increasing fatigue, cough, sore throat.  Frontal sinus pain and pressure, worsened with tilting her head forward, nasal discharge.  Minimal muscle aches, no overt fevers or chills.  Symptoms are moderate, persistent.  She did get her flu shot this year.  I reviewed the past medical history, family history, social history, surgical history, and allergies today and no changes were needed.  Please see the problem list section below in epic for further details.  Past Medical History: Past Medical History:  Diagnosis Date  . Anemia   . Anxiety   . Arthritis   . Chronic pain    has pain contract for Norco 5/325mg   . GERD (gastroesophageal reflux disease)   . Hyperlipidemia   . Hypertension   . Insomnia   . Pneumococcal pneumonia ([place]) 2000  . Pneumonia    Past Surgical History: Past Surgical History:  Procedure Laterality Date  . CESAREAN SECTION     x4  . CHEST TUBE INSERTION    . chest tubes  2000  . dysplastic mole removed     right ankle  . GASTRIC BYPASS    . INTRAUTERINE DEVICE (IUD) INSERTION N/A 07/11/2014   Procedure: INTRAUTERINE DEVICE (IUD) INSERTION;  Surgeon: Emily Filbert, MD;  Location: [facility];  Service: Gynecology;  Laterality: N/A;  . IUD REMOVAL N/A 07/11/2014   Procedure: INTRAUTERINE DEVICE (IUD) REMOVAL;  Surgeon: Emily Filbert, MD;  Location: [facility];  Service: Gynecology;  Laterality: N/A;  . mini gastric bypass  2011  . recoonstruction of right lung    . SHOULDER ARTHROSCOPY WITH SUBACROMIAL DECOMPRESSION, ROTATOR CUFF REPAIR AND BICEP TENDON REPAIR Left 05/20/2017   Procedure: LEFT SHOULDER ARTHROSCOPY WITH DEBRIDEMENT, ACROMIOPLASTY, DISTAL CLAVICAL EXCISION, ROTATOR CUFF REPAIR, OPEN BICEP TENODESIS;  Surgeon: Hiram Gash, MD;  Location: [facility];  Service: Orthopedics;  Laterality: Left;  [name] VIDEO BRONCHOSCOPY  Bilateral 04/20/2013   Procedure: VIDEO BRONCHOSCOPY WITH FLUORO;  Surgeon: Elsie Stain, MD;  [facility];  Service: Cardiopulmonary;  Laterality: Bilateral;   Social History: Social History   Socioeconomic History  . Marital status: Legally Separated    Spouse name: None  . Number of children: None  . Years of education: None  . Highest education level: None  Social Needs  . Financial resource strain: None  . Food insecurity - worry: None  . Food insecurity - inability: None  . Transportation needs - medical: None  . Transportation needs - non-medical: None  Occupational History  . Occupation: Furniture conservator/restorer: [employer]  Tobacco Use  . Smoking status: Never Smoker  . Smokeless tobacco: Never Used  Substance and Sexual Activity  . Alcohol use: Yes    Comment: 1 glass per month  . Drug use: No  . Sexual activity: Yes    Partners: Male    Birth control/protection: IUD  Other Topics Concern  . None  Social History Narrative  . None   Family History: Family History  Problem Relation Age of Onset  . Colon cancer Unknown        grandmother  . Heart attack Unknown        grandfather  . Hyperlipidemia Unknown   . Diabetes Father        father  . Hypertension Father   . Hypertension Mother   . Asthma Daughter  Allergies: Allergies  Allergen Reactions  . Levaquin [Levofloxacin In D5w] Hives    Has tolerated avelox  . Penicillins Hives   Medications: See med rec.  Review of Systems: No fevers, chills, night sweats, weight loss, chest pain, or shortness of breath.   Objective:    General: Well Developed, well nourished, and in no acute distress.  Neuro: Alert and oriented x3, extra-ocular muscles intact, sensation grossly intact.  HEENT: Normocephalic, atraumatic, pupils equal round reactive to light, neck supple, no masses, no lymphadenopathy, thyroid nonpalpable.  Oropharynx, nasopharynx, ear canals unremarkable. Skin:  Warm and dry, no rashes. Cardiac: Regular rate and rhythm, no murmurs rubs or gallops, no lower extremity edema.  Respiratory: Clear to auscultation bilaterally. Not using accessory muscles, speaking in full sentences.  Rapid flu test is negative.  Impression and Recommendations:    Acute non-recurrent maxillary sinusitis Negative flu swab. Azithromycin, prednisone, return if no better in a week. ___________________________________________ Gwen Her. Dianah Field, M.D., ABFM., CAQSM. Primary Care and [place] Instructor of [place] [employer] [facility]

## 2017-10-06 NOTE — Assessment & Plan Note (Addendum)
Negative flu swab. Azithromycin, prednisone, return if no better in a week.

## 2017-10-22 ENCOUNTER — Other Ambulatory Visit: Payer: Self-pay | Admitting: *Deleted

## 2017-10-22 DIAGNOSIS — M25512 Pain in left shoulder: Secondary | ICD-10-CM

## 2017-10-22 DIAGNOSIS — S46212D Strain of muscle, fascia and tendon of other parts of biceps, left arm, subsequent encounter: Secondary | ICD-10-CM

## 2017-10-22 DIAGNOSIS — J111 Influenza due to unidentified influenza virus with other respiratory manifestations: Secondary | ICD-10-CM

## 2017-10-22 DIAGNOSIS — M75122 Complete rotator cuff tear or rupture of left shoulder, not specified as traumatic: Secondary | ICD-10-CM

## 2017-10-22 DIAGNOSIS — R69 Illness, unspecified: Principal | ICD-10-CM

## 2017-10-22 DIAGNOSIS — Z9889 Other specified postprocedural states: Secondary | ICD-10-CM

## 2017-10-22 MED ORDER — OSELTAMIVIR PHOSPHATE 75 MG PO CAPS: 75.0000 mg | ORAL_CAPSULE | Freq: Every day | ORAL | 0 refills | Status: AC

## 2017-10-22 NOTE — Telephone Encounter (Signed)
Pt left vm wanting a refill of her Vicodin.  Kristopher [place].

## 2017-10-23 MED ORDER — HYDROCODONE-ACETAMINOPHEN 5-325 MG PO TABS
1.0000 | ORAL_TABLET | Freq: Three times a day (TID) | ORAL | 0 refills | Status: DC
Start: 2017-10-23 — End: 2017-11-19

## 2017-11-19 ENCOUNTER — Other Ambulatory Visit: Payer: Self-pay | Admitting: *Deleted

## 2017-11-19 DIAGNOSIS — M25512 Pain in left shoulder: Secondary | ICD-10-CM

## 2017-11-19 DIAGNOSIS — M75122 Complete rotator cuff tear or rupture of left shoulder, not specified as traumatic: Secondary | ICD-10-CM

## 2017-11-19 DIAGNOSIS — Z9889 Other specified postprocedural states: Secondary | ICD-10-CM

## 2017-11-19 DIAGNOSIS — S46212D Strain of muscle, fascia and tendon of other parts of biceps, left arm, subsequent encounter: Secondary | ICD-10-CM

## 2017-11-19 MED ORDER — HYDROCODONE-ACETAMINOPHEN 5-325 MG PO TABS
1.0000 | ORAL_TABLET | Freq: Three times a day (TID) | ORAL | 0 refills | Status: DC
Start: 2017-11-19 — End: 2017-12-18

## 2017-11-19 NOTE — Telephone Encounter (Signed)
Pt left vm requesting a refill of her Hydrocodone.

## 2017-12-03 ENCOUNTER — Encounter: Payer: Self-pay | Admitting: Osteopathic Medicine

## 2017-12-03 ENCOUNTER — Ambulatory Visit: Payer: BLUE CROSS/BLUE SHIELD | Admitting: Osteopathic Medicine

## 2017-12-03 VITALS — BP 126/77 | HR 72 | Temp 98.2°F | Wt 162.3 lb

## 2017-12-03 DIAGNOSIS — J029 Acute pharyngitis, unspecified: Secondary | ICD-10-CM | POA: Diagnosis not present

## 2017-12-03 LAB — POCT RAPID STREP A (OFFICE): RAPID STREP A SCREEN: NEGATIVE

## 2017-12-03 MED ORDER — LIDOCAINE VISCOUS HCL 2 % MT SOLN: 5.0000 mL | OROMUCOSAL | 1 refills | Status: DC | PRN

## 2017-12-03 MED ORDER — CEPHALEXIN 500 MG PO TABS: 500.0000 mg | ORAL_TABLET | Freq: Two times a day (BID) | ORAL | 0 refills | Status: DC

## 2017-12-03 MED ORDER — GUAIFENESIN-CODEINE 100-10 MG/5ML PO SOLN: 5.0000 mL | ORAL | 0 refills | Status: DC | PRN

## 2017-12-03 NOTE — Patient Instructions (Signed)
   Strep test was negative  Most of the time, strep will cause fever and will not cause a cough  I suspect your symptoms are viral and antibiotics will not be helpful BUT I have printed a script that will treat strep if your symptoms get worse or if fever and lymph node pain develop.   In the meantime, treating with numbing medicine to the throat and cough medicine. DO NOT take the cough medicine with other pain medicines.

## 2017-12-03 NOTE — Progress Notes (Signed)
HPI: Kimberly Wade is a 47 y.o. female who  has a past medical history of Anemia, Anxiety, Arthritis, Chronic pain, GERD (gastroesophageal reflux disease), Hyperlipidemia, Hypertension, Insomnia, Pneumococcal pneumonia ([place]) (2000), and Pneumonia.  she presents to [facility] today, 12/03/17,  for chief complaint of: Sore throat   Her 53-month-old has bronchitis and 58-month-old has strep throat  . Location/Quality: throat soreness worse on R side . Duration: <24 hours  . Modifying factors: OTC medications not helpful  . Assoc signs/symptoms: coughing  Most recent abx: Z-pack and steroid Rx 10/06/17 for sinusitis    Past medical, surgical, social and family history reviewed:  Patient Active Problem List   Diagnosis Date Noted  . Acute non-recurrent maxillary sinusitis 10/06/2017  . Biceps muscle tear, left, subsequent encounter 06/08/2017  . Complete tear of left rotator cuff 06/08/2017  . Acute shoulder bursitis, left 04/03/2017  . Overweight (BMI 25.0-29.9) 02/01/2017  . 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 Influenza-like illness 08/29/2016  . Generalized anxiety disorder 08/09/2016  . Low back pain without sciatica 08/08/2016  . Right trigger finger 05/05/2016  . Right hand pain 05/05/2016  . Anemia 10/26/2015  . Metrorrhagia 09/18/2015  . Ovarian cyst, right 09/14/2015  . Tension headache 08/08/2014  . Primary osteoarthritis of right hand 02/02/2014  . Aspiration into lower respiratory tract 05/02/2013  . Insomnia 12/24/2012  . Depression 12/24/2012  . Migraine 12/24/2012  . Hyperlipidemia 12/24/2012  . Vitamin D deficiency 12/24/2012  . Essential hypertension, benign 12/24/2012  . Anxiety 12/24/2012  . S/P gastric bypass 12/24/2012  . Increased PTH level 12/24/2012  . GERD (gastroesophageal reflux disease) 12/24/2012    Past Surgical History:  Procedure  Laterality Date  . CESAREAN SECTION     x4  . CHEST TUBE INSERTION    . chest tubes  2000  . dysplastic mole removed     right ankle  . GASTRIC BYPASS    . INTRAUTERINE DEVICE (IUD) INSERTION N/A 07/11/2014   Procedure: INTRAUTERINE DEVICE (IUD) INSERTION;  Surgeon: Emily Filbert, MD;  Location: [facility];  Service: Gynecology;  Laterality: N/A;  . IUD REMOVAL N/A 07/11/2014   Procedure: INTRAUTERINE DEVICE (IUD) REMOVAL;  Surgeon: Emily Filbert, MD;  Location: [facility];  Service: Gynecology;  Laterality: N/A;  . mini gastric bypass  2011  . recoonstruction of right lung    . SHOULDER ARTHROSCOPY WITH SUBACROMIAL DECOMPRESSION, ROTATOR CUFF REPAIR AND BICEP TENDON REPAIR Left 05/20/2017   Procedure: LEFT SHOULDER ARTHROSCOPY WITH DEBRIDEMENT, ACROMIOPLASTY, DISTAL CLAVICAL EXCISION, ROTATOR CUFF REPAIR, OPEN BICEP TENODESIS;  Surgeon: Hiram Gash, MD;  Location: [facility];  Service: Orthopedics;  Laterality: Left;  [name] VIDEO BRONCHOSCOPY Bilateral 04/20/2013   Procedure: VIDEO BRONCHOSCOPY WITH FLUORO;  Surgeon: Elsie Stain, MD;  Location: [facility];  Service: Cardiopulmonary;  Laterality: Bilateral;    Social History   Tobacco Use  . Smoking status: Never Smoker  . Smokeless tobacco: Never Used  Substance Use Topics  . Alcohol use: Yes    Comment: 1 glass per month    Family History  Problem Relation Age of Onset  . Colon cancer Unknown        grandmother  . Heart attack Unknown        grandfather  . Hyperlipidemia Unknown   . Diabetes Father        father  . Hypertension Father   . Hypertension Mother   .  Asthma Daughter      Current medication list and allergy/intolerance information reviewed:    Current Outpatient Medications  Medication Sig Dispense Refill  . ALPRAZolam (XANAX) 1 MG tablet Take 1/2 to one tablet as needed for acute anxiety. 20 tablet 1  . azithromycin (ZITHROMAX Z-PAK) 250 MG tablet Take 2 tablets (500 mg) on  Day 1,  followed by 1  tablet (250 mg) once daily on Days 2 through 5. 6 tablet 0  . Biotin 5000 MCG TABS Take 1 tablet by mouth 2 (two) times daily.    . busPIRone (BUSPAR) 7.5 MG tablet Take 1 tablet (7.5 mg total) by mouth 3 (three) times daily. 90 tablet 5  . Calcium Citrate-Vitamin D (CALCIUM CITRATE + PO) Take 1 tablet by mouth daily.     . cyclobenzaprine (FLEXERIL) 10 MG tablet 1/2-1 tab by mouth daily at bedtime 30 tablet 3  . ferrous sulfate 325 (65 FE) MG tablet Take 1 tablet (325 mg total) by mouth 2 (two) times daily with a meal. 180 tablet 0  . hydrochlorothiazide (HYDRODIURIL) 25 MG tablet TAKE 1 TO 2 TABLETS BY MOUTH DAILY 60 tablet 0  . hydrochlorothiazide (HYDRODIURIL) 25 MG tablet TAKE ONE TABLET BY MOUTH DAILY AS NEEDED FOR LOWER EXTREMITY SWELLING.  Please contact our office to schedule follow up appointment 30 tablet 1  . HYDROcodone-acetaminophen (NORCO/VICODIN) 5-325 MG tablet Take 1 tablet by mouth 3 (three) times daily with meals. 90 tablet 0  . lisinopril-hydrochlorothiazide (ZESTORETIC) 20-12.5 MG tablet Take 1 tablet by mouth daily. 90 tablet 3  . meloxicam (MOBIC) 15 MG tablet One tab PO qAM with breakfast for 2 weeks, then daily prn pain. 30 tablet 3  . methylPREDNISolone (MEDROL DOSEPAK) 4 MG TBPK tablet Take as directed by package insert. 21 tablet 0  . Multiple Vitamin (MULTIVITAMIN) tablet Take 1 tablet by mouth 3 (three) times daily.    . ondansetron (ZOFRAN-ODT) 8 MG disintegrating tablet Take 1 tablet (8 mg total) by mouth every 8 (eight) hours as needed for nausea. 20 tablet 3  . predniSONE (DELTASONE) 50 MG tablet Take 1 tablet (50 mg total) by mouth daily. 5 tablet 0  . pyridOXINE (VITAMIN B-6) 25 MG tablet Take by mouth.    . traZODone (DESYREL) 100 MG tablet TAKE 1 TO 3 TABLET BY MOUTH AT BEDTIME ONLY IF NEEDED. 90 tablet 5  . venlafaxine XR (EFFEXOR-XR) 150 MG 24 hr capsule TAKE ONE CAPSULE BY MOUTH DAILY WITH BREAKFAST 30 capsule 1  . Vitamin D, Ergocalciferol, (DRISDOL)  50000 units CAPS capsule TAKE 1 CAPSULE BY MOUTH EVERY 7 DAYS. 12 capsule 3   No current facility-administered medications for this visit.     Allergies  Allergen Reactions  . Levaquin [Levofloxacin In D5w] Hives    Has tolerated avelox  . Penicillins Hives      Review of Systems:  Constitutional:  No  fever, no chills, +recent illness, No unintentional weight changes. +significant fatigue.   HEENT: No  headache, no vision change, no hearing change, +sore throat, No  sinus pressure  Cardiac: No  chest pain, No  pressure, No palpitations  Respiratory:  No  shortness of breath. +Cough  Gastrointestinal: No  abdominal pain, No  nausea, No  vomiting,  No  blood in stool, No  diarrhea  Musculoskeletal: No new myalgia/arthralgia  Skin: No  Rash   Exam:  BP 126/77 (BP Location: Left Arm, Patient Position: Sitting, Cuff Size: Normal)   Pulse 72   Temp  98.2 F (36.8 C) (Oral)   Wt 162 lb 4.8 oz (73.6 kg)   BMI 26.20 kg/m   Constitutional: VS see above. General Appearance: alert, well-developed, well-nourished, NAD  Eyes: Normal lids and conjunctive, non-icteric sclera  Ears, Nose, Mouth, Throat: MMM, Normal external inspection ears/nares/mouth/lips/gums. TM normal bilaterally. Pharynx/tonsils +erythema, no exudate. Nasal mucosa normal.   Neck: No masses, trachea midline. No tenderness/mass appreciated. No lymphadenopathy  Respiratory: Normal respiratory effort. no wheeze, no rhonchi, no rales  Cardiovascular: S1/S2 normal, no murmur, no rub/gallop auscultated. RRR. No lower extremity edema.   Gastrointestinal: Nontender, no masses. Bowel sounds normal.  Musculoskeletal: Gait normal.   Neurological: Normal balance/coordination. No tremor.   Skin: warm, dry, intact.   Psychiatric: Normal judgment/insight. Normal mood and affect.   Results for orders placed or performed in visit on 12/03/17 (from the past 72 hour(s))  POCT rapid strep A     Status: None    Collection Time: 12/03/17  9:22 AM  Result Value Ref Range   Rapid Strep A Screen Negative Negative       ASSESSMENT/PLAN:   Sore throat - Plan: POCT rapid strep A    Patient Instructions   Strep test was negative  Most of the time, strep will cause fever and will not cause a cough  I suspect your symptoms are viral and antibiotics will not be helpful BUT I have printed a script that will treat strep if your symptoms get worse or if fever and lymph node pain develop.   In the meantime, treating with numbing medicine to the throat and cough medicine. DO NOT take the cough medicine with other pain medicines.    Meds ordered this encounter  Medications  . Lidocaine HCl 2 % SOLN    Sig: Use as directed 5-10 mLs in the mouth or throat every 3 (three) hours as needed (mouth/throat pain).    Dispense:  100 mL    Refill:  1  . guaiFENesin-codeine 100-10 MG/5ML syrup    Sig: Take 5 mLs by mouth every 4 (four) hours as needed for cough.    Dispense:  180 mL    Refill:  0  . Cephalexin 500 MG tablet    Sig: Take 1 tablet (500 mg total) by mouth 2 (two) times daily. Print if fever, swelling lymph nodes in neck, severe sore throat persists. VOID after 12/17/17    Dispense:  20 tablet    Refill:  0     Visit summary with medication list and pertinent instructions was printed for patient to review. All questions at time of visit were answered - patient instructed to contact office with any additional concerns. ER/RTC precautions were reviewed with the patient.   Follow-up plan: Return if symptoms worsen or fail to improve.  Please note: voice recognition software was used to produce this document, and typos may escape review. Please contact Dr. Sheppard Coil for any needed clarifications.

## 2017-12-08 ENCOUNTER — Telehealth: Payer: Self-pay | Admitting: Osteopathic Medicine

## 2017-12-08 NOTE — Telephone Encounter (Signed)
If her cough is worse, she should come in to have [place] listen to lungs again and discuss if a chest Xray might be needed. Please have her make an appointment or see urgent care

## 2017-12-08 NOTE — Telephone Encounter (Signed)
Pt called and stated she was seen by Dr. Sheppard Coil on 4/18 for a cold and was given some medication. She said she has developed a worse cough that hurts her chest. She is wanting to know if she needs to come back in or if she can get some cough medication prescribed to her. She said tesslon pearls typically do not help her. Thanks

## 2017-12-08 NOTE — Telephone Encounter (Signed)
Left VM with recommendation  

## 2017-12-10 ENCOUNTER — Encounter: Payer: Self-pay | Admitting: Osteopathic Medicine

## 2017-12-10 ENCOUNTER — Other Ambulatory Visit: Payer: Self-pay | Admitting: Physician Assistant

## 2017-12-10 ENCOUNTER — Ambulatory Visit (INDEPENDENT_AMBULATORY_CARE_PROVIDER_SITE_OTHER): Payer: BLUE CROSS/BLUE SHIELD | Admitting: Osteopathic Medicine

## 2017-12-10 DIAGNOSIS — J208 Acute bronchitis due to other specified organisms: Secondary | ICD-10-CM

## 2017-12-10 DIAGNOSIS — J069 Acute upper respiratory infection, unspecified: Secondary | ICD-10-CM

## 2017-12-10 DIAGNOSIS — B9789 Other viral agents as the cause of diseases classified elsewhere: Secondary | ICD-10-CM

## 2017-12-10 DIAGNOSIS — I10 Essential (primary) hypertension: Secondary | ICD-10-CM

## 2017-12-10 DIAGNOSIS — F330 Major depressive disorder, recurrent, mild: Secondary | ICD-10-CM

## 2017-12-10 MED ORDER — HYDROCODONE-HOMATROPINE 5-1.5 MG/5ML PO SYRP: 5.0000 mL | ORAL_SOLUTION | Freq: Four times a day (QID) | ORAL | 0 refills | Status: DC | PRN

## 2017-12-10 MED ORDER — PREDNISONE 20 MG PO TABS: 20.0000 mg | ORAL_TABLET | Freq: Two times a day (BID) | ORAL | 0 refills | Status: DC

## 2017-12-10 NOTE — Patient Instructions (Signed)
Plan:  Change cough syrup  See below for other cough remedies  Lungs sound good, if no better or worse would get chest Xray  Try steroid burst to calm inflammation  If steroids cause side effects, can just stop them, no need to go down slowly   Over-the-Counter Medications & Home Remedies for Upper Respiratory Illness  Aches/Pains, Fever, Headache Acetaminophen (Tylenol) 500 mg tablets - take max 2 tablets (1000 mg) every 6 hours (4 times per day)  Ibuprofen (Motrin) 200 mg tablets - take max 4 tablets (800 mg) every 6 hours*  Sinus Congestion Nasal Saline if desired Oxymetolazone (Afrin, others) sparing use due to rebound congestion, NEVER use in kids Phenylephrine (Sudafed) 10 mg tablets every 4 hours (or the 12-hour formulation)* Diphenhydramine (Benadryl) 25 mg tablets - take max 2 tablets every 4 hours  Cough & Sore Throat Prescription cough pills or syrups as directed Lozenges w/ Benzocaine + Menthol (Cepacol) Honey - as much as you want! Teas which "coat the throat" - look for ingredients Elm Bark, Licorice Root, Marshmallow Root

## 2017-12-10 NOTE — Progress Notes (Signed)
HPI: Kimberly Wade is a 47 y.o. female who  has a past medical history of Anemia, Anxiety, Arthritis, Chronic pain, GERD (gastroesophageal reflux disease), Hyperlipidemia, Hypertension, Insomnia, Pneumococcal pneumonia ([place]) (2000), and Pneumonia.  she presents to [facility] today, 12/10/17,  for chief complaint of: Sore throat   Seen a week ago for sore throat, now cough is greatest concern. Cough medicine was making her sleepy, still unable to sleep to night due to cough.  . Duration: about a week . Modifying factors: OTC medications not helpful. We Rx lidocaine soln, Guaifenesin-Codeine syrup, and instructed fill abx only if needed for symptoms of strep but viral infection was much more likely      Past medical, surgical, social and family history reviewed.  Social History   Tobacco Use  . Smoking status: Never Smoker  . Smokeless tobacco: Never Used  Substance Use Topics  . Alcohol use: Yes    Comment: 1 glass per month     Current medication list and allergy/intolerance information reviewed:    Allergies  Allergen Reactions  . Levofloxacin Hives  . Penicillins Hives  . Levaquin [Levofloxacin In D5w] Hives    Has tolerated avelox      Review of Systems:  Constitutional:  No  fever, no chills, +recent illness, No unintentional weight changes. +significant fatigue.   HEENT: No  headache, no vision change, no hearing change, +sore throat, No  sinus pressure  Cardiac: No  chest pain, No  pressure, No palpitations  Respiratory:  No  shortness of breath. +Cough  Gastrointestinal: No  abdominal pain, No  nausea, No  vomiting,  No  blood in stool, No  diarrhea  Musculoskeletal: No new myalgia/arthralgia  Skin: No  Rash   Exam:  BP 118/60 (BP Location: Left Arm, Patient Position: Sitting, Cuff Size: Normal)   Pulse 78   Temp 98.6 F (37 C) (Oral)   Wt 158 lb (71.7 kg)   SpO2 100%   BMI 25.50 kg/m   Constitutional:  VS see above. General Appearance: alert, well-developed, well-nourished, NAD  Eyes: Normal lids and conjunctive, non-icteric sclera  Ears, Nose, Mouth, Throat: MMM, Normal external inspection ears/nares/mouth/lips/gums. TM normal bilaterally. Pharynx/tonsils +erythema, no exudate. Nasal mucosa normal.   Neck: No masses, trachea midline. No tenderness/mass appreciated. No lymphadenopathy  Respiratory: Normal respiratory effort. no wheeze, no rhonchi, no rales  Cardiovascular: S1/S2 normal, no murmur, no rub/gallop auscultated. RRR.   Musculoskeletal: Gait normal.   Neurological: Normal balance/coordination. No tremor.      ASSESSMENT/PLAN: Diagnoses of Viral bronchitis and Viral URI with cough were pertinent to this visit.  Meds ordered this encounter  Medications  . HYDROcodone-homatropine (HYCODAN) 5-1.5 MG/5ML syrup    Sig: Take 5 mLs by mouth every 6 (six) hours as needed for cough.    Dispense:  120 mL    Refill:  0  . predniSONE (DELTASONE) 20 MG tablet    Sig: Take 1 tablet (20 mg total) by mouth 2 (two) times daily with a meal.    Dispense:  10 tablet    Refill:  0   Patient Instructions  Plan:  Change cough syrup  See below for other cough remedies  Lungs sound good, if no better or worse would get chest Xray  Try steroid burst to calm inflammation  If steroids cause side effects, can just stop them, no need to go down slowly   Over-the-Counter Medications & Home Remedies for Upper Respiratory Illness  Aches/Pains, Fever,  Headache Acetaminophen (Tylenol) 500 mg tablets - take max 2 tablets (1000 mg) every 6 hours (4 times per day)  Ibuprofen (Motrin) 200 mg tablets - take max 4 tablets (800 mg) every 6 hours*  Sinus Congestion Nasal Saline if desired Oxymetolazone (Afrin, others) sparing use due to rebound congestion, NEVER use in kids Phenylephrine (Sudafed) 10 mg tablets every 4 hours (or the 12-hour formulation)* Diphenhydramine (Benadryl) 25 mg  tablets - take max 2 tablets every 4 hours  Cough & Sore Throat Prescription cough pills or syrups as directed Lozenges w/ Benzocaine + Menthol (Cepacol) Honey - as much as you want! Teas which "coat the throat" - look for ingredients Elm Bark, Licorice Root, Marshmallow Root      Visit summary with medication list and pertinent instructions was printed for patient to review. All questions at time of visit were answered - patient instructed to contact office with any additional concerns. ER/RTC precautions were reviewed with the patient.   Follow-up plan: Return if symptoms worsen or fail to improve.  Please note: voice recognition software was used to produce this document, and typos may escape review. Please contact Dr. Sheppard Coil for any needed clarifications.

## 2017-12-18 ENCOUNTER — Other Ambulatory Visit: Payer: Self-pay | Admitting: *Deleted

## 2017-12-18 DIAGNOSIS — S46212D Strain of muscle, fascia and tendon of other parts of biceps, left arm, subsequent encounter: Secondary | ICD-10-CM

## 2017-12-18 DIAGNOSIS — M25512 Pain in left shoulder: Secondary | ICD-10-CM

## 2017-12-18 DIAGNOSIS — M75122 Complete rotator cuff tear or rupture of left shoulder, not specified as traumatic: Secondary | ICD-10-CM

## 2017-12-18 DIAGNOSIS — Z9889 Other specified postprocedural states: Secondary | ICD-10-CM

## 2017-12-18 MED ORDER — HYDROCODONE-ACETAMINOPHEN 5-325 MG PO TABS: 1.0000 | ORAL_TABLET | Freq: Three times a day (TID) | ORAL | 0 refills | Status: DC

## 2018-01-13 ENCOUNTER — Other Ambulatory Visit: Payer: Self-pay | Admitting: Physician Assistant

## 2018-01-13 DIAGNOSIS — S46212D Strain of muscle, fascia and tendon of other parts of biceps, left arm, subsequent encounter: Secondary | ICD-10-CM

## 2018-01-13 DIAGNOSIS — F419 Anxiety disorder, unspecified: Secondary | ICD-10-CM

## 2018-01-13 DIAGNOSIS — M75122 Complete rotator cuff tear or rupture of left shoulder, not specified as traumatic: Secondary | ICD-10-CM

## 2018-01-13 DIAGNOSIS — F411 Generalized anxiety disorder: Secondary | ICD-10-CM

## 2018-01-13 DIAGNOSIS — I10 Essential (primary) hypertension: Secondary | ICD-10-CM

## 2018-01-13 DIAGNOSIS — F330 Major depressive disorder, recurrent, mild: Secondary | ICD-10-CM

## 2018-01-13 DIAGNOSIS — Z9889 Other specified postprocedural states: Secondary | ICD-10-CM

## 2018-01-13 DIAGNOSIS — M25512 Pain in left shoulder: Secondary | ICD-10-CM

## 2018-01-15 ENCOUNTER — Other Ambulatory Visit: Payer: Self-pay | Admitting: *Deleted

## 2018-01-15 DIAGNOSIS — I10 Essential (primary) hypertension: Secondary | ICD-10-CM

## 2018-01-15 MED ORDER — HYDROCODONE-ACETAMINOPHEN 5-325 MG PO TABS: 1.0000 | ORAL_TABLET | Freq: Three times a day (TID) | ORAL | 0 refills | Status: DC

## 2018-01-15 MED ORDER — HYDROCHLOROTHIAZIDE 25 MG PO TABS: ORAL_TABLET | ORAL | 1 refills | Status: DC

## 2018-02-11 ENCOUNTER — Other Ambulatory Visit: Payer: Self-pay | Admitting: *Deleted

## 2018-02-11 DIAGNOSIS — Z9889 Other specified postprocedural states: Secondary | ICD-10-CM

## 2018-02-11 DIAGNOSIS — M25512 Pain in left shoulder: Secondary | ICD-10-CM

## 2018-02-11 DIAGNOSIS — M75122 Complete rotator cuff tear or rupture of left shoulder, not specified as traumatic: Secondary | ICD-10-CM

## 2018-02-11 DIAGNOSIS — S46212D Strain of muscle, fascia and tendon of other parts of biceps, left arm, subsequent encounter: Secondary | ICD-10-CM

## 2018-02-11 NOTE — Progress Notes (Unsigned)
Pt calls wanting a refill for this med. Advised pt. You were out of office today but you would address tomorrow Friday. KG LPN

## 2018-02-12 ENCOUNTER — Encounter: Payer: Self-pay | Admitting: Physician Assistant

## 2018-02-12 ENCOUNTER — Telehealth: Payer: Self-pay | Admitting: Physician Assistant

## 2018-02-12 ENCOUNTER — Ambulatory Visit: Payer: BLUE CROSS/BLUE SHIELD | Admitting: Physician Assistant

## 2018-02-12 VITALS — BP 117/64 | HR 72 | Wt 169.0 lb

## 2018-02-12 DIAGNOSIS — I10 Essential (primary) hypertension: Secondary | ICD-10-CM | POA: Diagnosis not present

## 2018-02-12 DIAGNOSIS — Z9889 Other specified postprocedural states: Secondary | ICD-10-CM | POA: Diagnosis not present

## 2018-02-12 DIAGNOSIS — R5383 Other fatigue: Secondary | ICD-10-CM

## 2018-02-12 DIAGNOSIS — F330 Major depressive disorder, recurrent, mild: Secondary | ICD-10-CM

## 2018-02-12 DIAGNOSIS — S46212D Strain of muscle, fascia and tendon of other parts of biceps, left arm, subsequent encounter: Secondary | ICD-10-CM

## 2018-02-12 DIAGNOSIS — Z131 Encounter for screening for diabetes mellitus: Secondary | ICD-10-CM | POA: Diagnosis not present

## 2018-02-12 DIAGNOSIS — G8929 Other chronic pain: Secondary | ICD-10-CM | POA: Diagnosis not present

## 2018-02-12 DIAGNOSIS — M75122 Complete rotator cuff tear or rupture of left shoulder, not specified as traumatic: Secondary | ICD-10-CM | POA: Diagnosis not present

## 2018-02-12 DIAGNOSIS — M25512 Pain in left shoulder: Secondary | ICD-10-CM

## 2018-02-12 LAB — POCT GLYCOSYLATED HEMOGLOBIN (HGB A1C): Hemoglobin A1C: 4.9 % (ref 4.0–5.6)

## 2018-02-12 MED ORDER — AMBULATORY NON FORMULARY MEDICATION: 5 refills | Status: DC

## 2018-02-12 MED ORDER — HYDROCHLOROTHIAZIDE 25 MG PO TABS: ORAL_TABLET | ORAL | 1 refills | Status: DC

## 2018-02-12 MED ORDER — HYDROCODONE-ACETAMINOPHEN 5-325 MG PO TABS: 1.0000 | ORAL_TABLET | Freq: Three times a day (TID) | ORAL | 0 refills | Status: DC

## 2018-02-12 MED ORDER — VENLAFAXINE HCL ER 150 MG PO CP24
ORAL_CAPSULE | ORAL | 2 refills | Status: DC
Start: 2018-02-12 — End: 2019-01-11

## 2018-02-12 MED ORDER — VITAMIN D (ERGOCALCIFEROL) 1.25 MG (50000 UNIT) PO CAPS: ORAL_CAPSULE | ORAL | 3 refills | Status: DC

## 2018-02-12 NOTE — Progress Notes (Signed)
Subjective:    Patient ID: Kimberly Wade, female    DOB: February 26, 1971, 47 y.o.   MRN: [id number]  HPI Pt is a 47 yo female with chronic left shoulder pain, MDD, HTN and with concern of no energy. She would like for her sugars to be tested. She is concerned they are dropping too low or even perhaps elevated. She has a sudden dip in energy after she eats.   HTN- no problems or concerns. No CP, palpitations, headaches or vision changes. Takes medication daily.   MDD- controlled. No problems. She is getting custody of a baby and she is very excited about the future.   She continues to use norco for left shoulder pain three times a day that is the same frequency she was using before surgery in October of 2018. She does want a refill on pennsaid. She states that does help a lot to have. She continues to take ibuprofen 800mg . She is no longer in PT.    .. Active Ambulatory Problems    Diagnosis Date Noted  . Insomnia 12/24/2012  . Depression 12/24/2012  . Migraine 12/24/2012  . Hyperlipidemia 12/24/2012  . Vitamin D deficiency 12/24/2012  . Essential hypertension, benign 12/24/2012  . Anxiety 12/24/2012  . S/P gastric bypass 12/24/2012  . Increased PTH level 12/24/2012  . GERD (gastroesophageal reflux disease) 12/24/2012  . Aspiration into lower respiratory tract 05/02/2013  . Primary osteoarthritis of right hand 02/02/2014  . Tension headache 08/08/2014  . Ovarian cyst, right 09/14/2015  . Metrorrhagia 09/18/2015  . Anemia 10/26/2015  . Right trigger finger 05/05/2016  . Right hand pain 05/05/2016  . Low back pain without sciatica 08/08/2016  . Generalized anxiety disorder 08/09/2016  . Influenza-like illness 08/29/2016  . 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 Overweight (BMI 25.0-29.9) 02/01/2017  . Acute shoulder bursitis, left 04/03/2017  . Biceps muscle tear, left, subsequent encounter 06/08/2017  . Complete tear of  left rotator cuff 06/08/2017  . Acute non-recurrent maxillary sinusitis 10/06/2017  . S/P shoulder surgery 02/12/2018  . Chronic left shoulder pain 02/12/2018  . No energy 02/17/2018   Resolved Ambulatory Problems    Diagnosis Date Noted  . Lingular pneumonia 12/24/2012  . Type 2 diabetes mellitus ([place]) 12/24/2012  . Sinusitis, chronic 12/24/2012  . Diabetic retinopathy ([place]) 12/24/2012  . Acute cystitis 06/18/2016   Past Medical History:  Diagnosis Date  . Anemia   . Anxiety   . Arthritis   . Chronic pain   . GERD (gastroesophageal reflux disease)   . Hyperlipidemia   . Hypertension   . Insomnia   . Pneumococcal pneumonia ([place]) 2000  . Pneumonia       Review of Systems See HPI.     Objective:   Physical Exam  Constitutional: She is oriented to person, place, and time. She appears well-developed and well-nourished.  HENT:  Head: Normocephalic and atraumatic.  Cardiovascular: Normal rate and regular rhythm.  Pulmonary/Chest: Effort normal and breath sounds normal.  Musculoskeletal:  Decreased ROM of left shoulder. Not able to abduct past 90 degrees.   Neurological: She is alert and oriented to person, place, and time.  Psychiatric: She has a normal mood and affect. Her behavior is normal.          Assessment & Plan:  Marland KitchenMarland KitchenKassady was seen today for fatigue.  Diagnoses and all orders for this visit:  No energy -     CBC with Differential/Platelet -  TSH -     Ferritin -     B12 -     COMPLETE METABOLIC PANEL WITH GFR  Complete tear of left rotator cuff -     HYDROcodone-acetaminophen (NORCO/VICODIN) 5-325 MG tablet; Take 1 tablet by mouth 3 (three) times daily with meals.  Biceps muscle tear, left, subsequent encounter -     HYDROcodone-acetaminophen (NORCO/VICODIN) 5-325 MG tablet; Take 1 tablet by mouth 3 (three) times daily with meals.  S/P shoulder surgery -     HYDROcodone-acetaminophen (NORCO/VICODIN) 5-325 MG tablet; Take 1 tablet by mouth 3  (three) times daily with meals.  Screening for diabetes mellitus -     POCT HgB A1C  Mild episode of recurrent major depressive disorder (HCC) -     venlafaxine XR (EFFEXOR-XR) 150 MG 24 hr capsule; TAKE ONE CAPSULE BY MOUTH EVERY MORNING WITH BREAKFAST.  Essential hypertension, benign -     hydrochlorothiazide (HYDRODIURIL) 25 MG tablet; TAKE 1 TABLET BY MOUTH DAILY AS NEEDED FOR LOWER EXTREMITY SWELLING  Chronic left shoulder pain  Other orders -     Vitamin D, Ergocalciferol, (DRISDOL) 50000 units CAPS capsule; TAKE 1 CAPSULE BY MOUTH EVERY 7 DAYS. -     AMBULATORY NON FORMULARY MEDICATION; Glucometer lancets and test strips to test sugars once a day for excessive sleepiness with concern for low blood sugar issues. -     Diclofenac Sodium (PENNSAID) 2 % SOLN; Place 1 application onto the skin 2 (two) times daily.  discussed with patient I believe it is time for a pain management referral to better control pain and since this appears it is going to be more long term. Discussed again not to use xanax in combination with norco due to risk of sudden death increase. [place] controlled substance database reviewed with no concerns. Pain contract up to date.    .. Lab Results  Component Value Date   HGBA1C 4.9 02/12/2018   Reassured her A!C looks great. I did go ahead and give glucometer for post prandial checkings since that is when she is the most sleepy and fatigued. Keep log. Discussed small freqent meals. Labs for no energy ordered and will add supplements and medications as needed.   .. Depression screen [facility] 2/9 12/10/2017 06/08/2017 12/05/2016  Decreased Interest 1 0 1  Down, Depressed, Hopeless 1 0 0  PHQ - 2 Score 2 0 1  Altered sleeping 1 1 1   Tired, decreased energy 2 1 1   Change in appetite 0 1 1  Feeling bad or failure about yourself  0 1 0  Trouble concentrating 1 0 1  Moving slowly or fidgety/restless 0 0 0  Suicidal thoughts 0 0 0  PHQ-9 Score 6 4 5   Difficult doing  work/chores Not difficult at all Not difficult at all -   Refilled effexor.   BP looks great. HCTZ refilled.

## 2018-02-12 NOTE — Telephone Encounter (Signed)
Kimberly Wade  02/11/2018  Orders Only  MRN:  [id number]  Description: 47 year old female Provider: Joanne Chars, LPN Department: [facility]  Progress Notes   Joanne Chars, LPN at 7/90/2409 7:35 AM   Status: Sign at close encounter    Pt calls wanting a refill for this med. Advised pt. You were out of office today but you would address tomorrow Friday. KG LPN    Diagnoses    Codes Comments  Complete tear of left rotator cuff  M75.122   Biceps muscle tear, left, subsequent encounter  S46.212D   Acute pain of left shoulder  M25.512   S/P shoulder surgery  Z98.890       Pending    Disp Refills Start End  HYDROcodone-acetaminophen (NORCO/VICODIN) 5-325 MG tablet 90 tablet 0 02/11/2018   Sig - Route:  Take 1 tablet by mouth 3 (three) times daily with meals. - Oral  Class:  Normal  DAW:  No  Orders   Order Information   Orders Report MAR History by Medication

## 2018-02-13 LAB — COMPLETE METABOLIC PANEL WITH GFR
AG Ratio: 1.6 (calc) (ref 1.0–2.5)
ALT: 15 U/L (ref 6–29)
AST: 15 U/L (ref 10–35)
Albumin: 3.8 g/dL (ref 3.6–5.1)
Alkaline phosphatase (APISO): 62 U/L (ref 33–115)
BILIRUBIN TOTAL: 0.4 mg/dL (ref 0.2–1.2)
BUN: 15 mg/dL (ref 7–25)
CALCIUM: 8.5 mg/dL — AB (ref 8.6–10.2)
CHLORIDE: 107 mmol/L (ref 98–110)
CO2: 27 mmol/L (ref 20–32)
Creat: 0.55 mg/dL (ref 0.50–1.10)
GFR, EST AFRICAN AMERICAN: 130 mL/min/{1.73_m2} (ref >=60)
GFR, Est Non African American: 112 mL/min/{1.73_m2} (ref >=60)
GLUCOSE: 76 mg/dL (ref 65–139)
Globulin: 2.4 g/dL (calc) (ref 1.9–3.7)
Potassium: 3.4 mmol/L — ABNORMAL LOW (ref 3.5–5.3)
Sodium: 141 mmol/L (ref 135–146)
TOTAL PROTEIN: 6.2 g/dL (ref 6.1–8.1)

## 2018-02-13 LAB — CBC WITH DIFFERENTIAL/PLATELET
Basophils Absolute: 41 cells/uL (ref 0–200)
Basophils Relative: 0.6 %
EOS PCT: 1.3 %
Eosinophils Absolute: 88 cells/uL (ref 15–500)
HEMATOCRIT: 33.8 % — AB (ref 35.0–45.0)
HEMOGLOBIN: 11.6 g/dL — AB (ref 11.7–15.5)
LYMPHS ABS: 1564 {cells}/uL (ref 850–3900)
MCH: 31.4 pg (ref 27.0–33.0)
MCHC: 34.3 g/dL (ref 32.0–36.0)
MCV: 91.6 fL (ref 80.0–100.0)
MONOS PCT: 8.5 %
MPV: 10.6 fL (ref 7.5–12.5)
NEUTROS ABS: 4529 {cells}/uL (ref 1500–7800)
Neutrophils Relative %: 66.6 %
Platelets: 212 10*3/uL (ref 140–400)
RBC: 3.69 10*6/uL — AB (ref 3.80–5.10)
RDW: 12.8 % (ref 11.0–15.0)
Total Lymphocyte: 23 %
WBC mixed population: 578 cells/uL (ref 200–950)
WBC: 6.8 10*3/uL (ref 3.8–10.8)

## 2018-02-13 LAB — VITAMIN B12: VITAMIN B 12: 1582 pg/mL — AB (ref 200–1100)

## 2018-02-13 LAB — FERRITIN: FERRITIN: 32 ng/mL (ref 16–232)

## 2018-02-13 LAB — TSH: TSH: 2.06 mIU/L

## 2018-02-16 NOTE — Progress Notes (Signed)
Call pt: potassium just a hair low. Increase potassium rich foods. b12 a little on the elevated side. What is the mg of your supplement for b12? Thyroid looks good. hgb just a hair outside of normal. I would increase some iron rich foods.

## 2018-02-17 DIAGNOSIS — R5383 Other fatigue: Secondary | ICD-10-CM | POA: Insufficient documentation

## 2018-02-17 MED ORDER — DICLOFENAC SODIUM 2 % TD SOLN: 1.0000 "application " | Freq: Two times a day (BID) | TRANSDERMAL | 5 refills | Status: DC

## 2018-02-27 DIAGNOSIS — M549 Dorsalgia, unspecified: Secondary | ICD-10-CM | POA: Diagnosis not present

## 2018-02-27 DIAGNOSIS — Z6828 Body mass index (BMI) 28.0-28.9, adult: Secondary | ICD-10-CM | POA: Diagnosis not present

## 2018-02-27 DIAGNOSIS — G8929 Other chronic pain: Secondary | ICD-10-CM | POA: Diagnosis not present

## 2018-02-27 DIAGNOSIS — Z79899 Other long term (current) drug therapy: Secondary | ICD-10-CM | POA: Diagnosis not present

## 2018-02-28 IMAGING — MR MR TOES*R* W/O CM
6 series · 40 of 40 positions shown · non-contrast
Comparison: None.

CLINICAL DATA: Status post fall. Fracture second toe pain great
toe. Not healing.

EXAM:
MRI OF THE RIGHT TOES WITHOUT CONTRAST
TECHNIQUE: Multiplanar, multisequence MR imaging of the right forefoot was
performed. No intravenous contrast was administered.

[Series 4: T1 · coronal · 3.0mm · 0.53mm/px · 8 of 32 slices shown (1 of 2)]
[im 1/32]
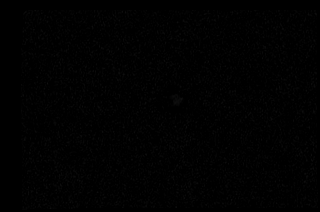
[im 5/32]
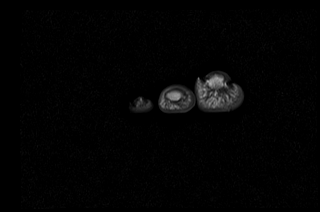
[im 9/32]
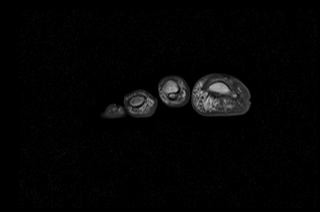
[im 14/32]
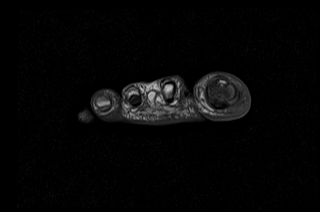
[im 18/32]
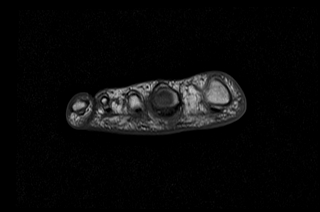
[im 23/32]
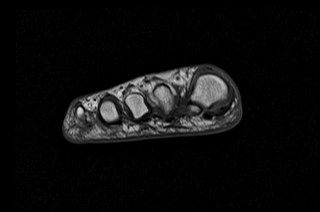
[im 27/32]
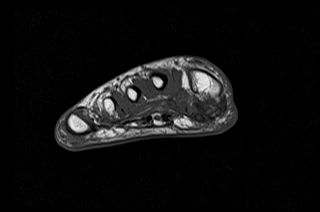
[im 32/32]
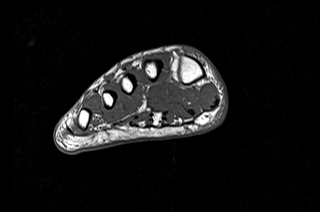

[Series 5: T2 fat-sat · coronal · 3.0mm · 0.53mm/px · 8 of 32 slices shown]
[im 1/32]
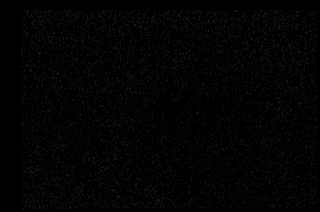
[im 5/32]
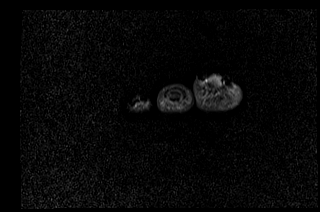
[im 9/32]
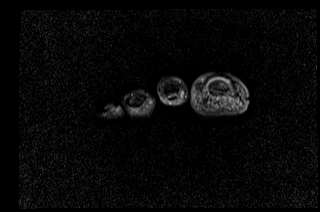
[im 14/32]
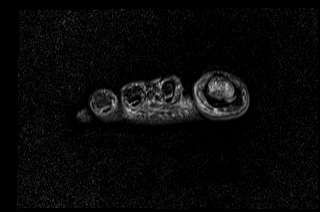
[im 18/32]
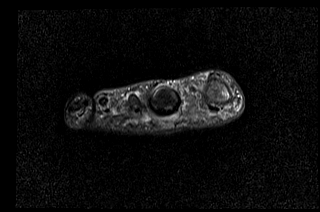
[im 23/32]
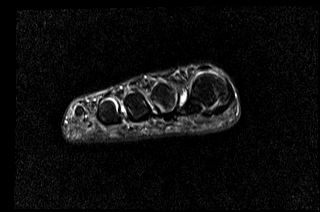
[im 27/32]
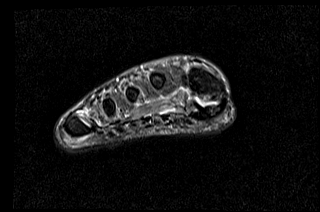
[im 32/32]
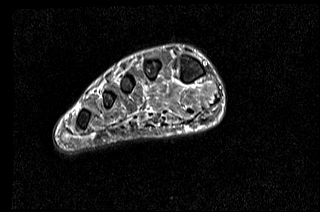

[Series 11: STIR · sagittal · 3.0mm · 0.66mm/px · 9 of 33 slices shown (1 of 2)]
[im 1/33]
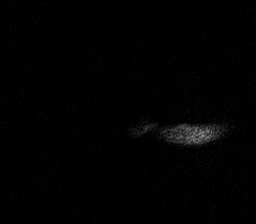
[im 5/33]
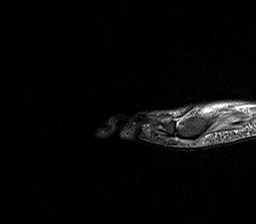
[im 9/33]
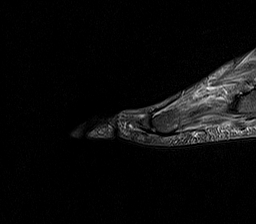
[im 13/33]
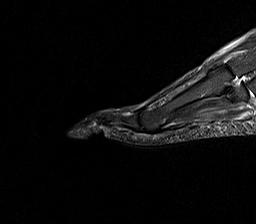
[im 17/33]
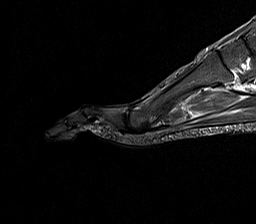
[im 21/33]
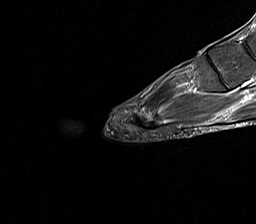
[im 25/33]
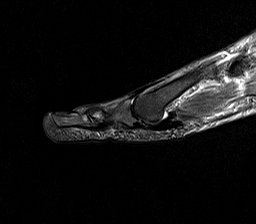
[im 29/33]
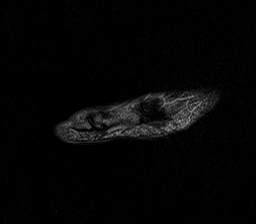
[im 33/33]
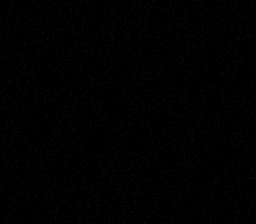

[Series 100: T1 · axial · 3.0mm · 0.46mm/px · z∈[-130,-76]mm · 5 of 18 slices shown (2 of 2)]
[im 1/18]
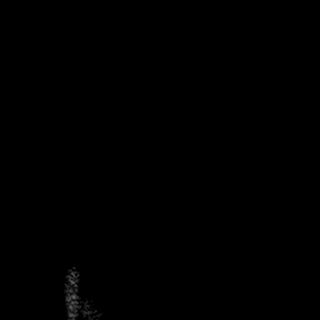
[im 5/18]
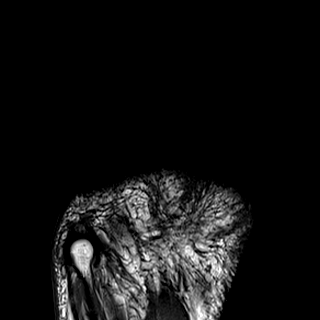
[im 9/18]
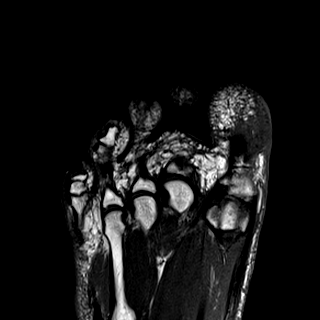
[im 13/18]
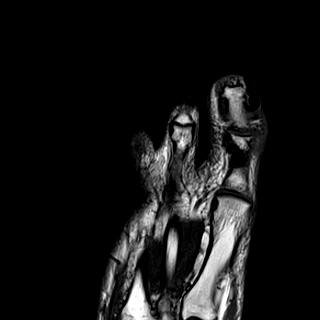
[im 18/18]
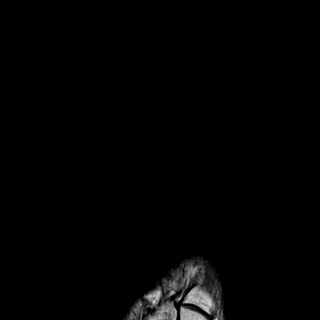

[Series 102: T2 · axial · 3.0mm · 0.57mm/px · z∈[-130,-76]mm · 5 of 18 slices shown]
[im 1/18]
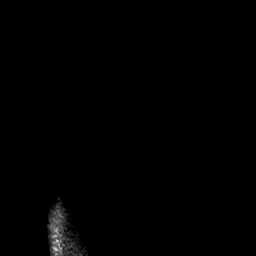
[im 5/18]
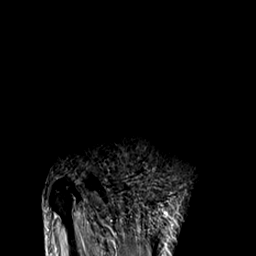
[im 9/18]
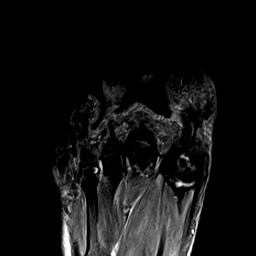
[im 13/18]
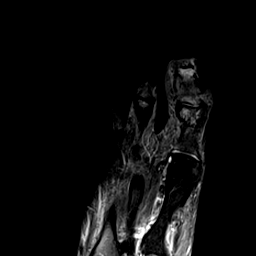
[im 18/18]
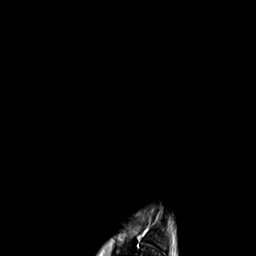

[Series 103: STIR · axial · 3.0mm · 0.57mm/px · z∈[-130,-76]mm · 5 of 18 slices shown (2 of 2)]
[im 1/18]
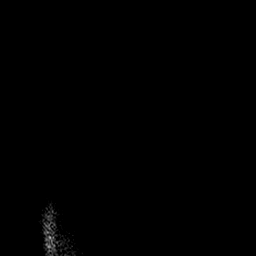
[im 5/18]
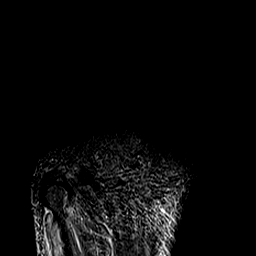
[im 9/18]
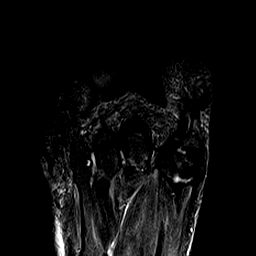
[im 13/18]
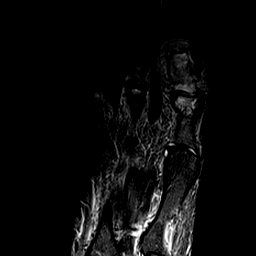
[im 18/18]
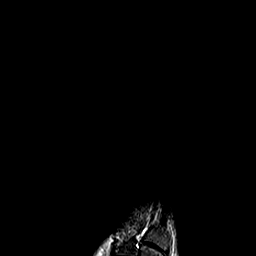

[40 of 40 positions shown; findings below may reference images not displayed]

FINDINGS: Bones/Joint/Cartilage

Comminuted fracture of the distal aspect of the first proximal
phalanx with mild surrounding marrow edema consistent with a healing
fracture. Mild marrow edema at the base of the first distal phalanx
likely reactive.

No other acute fracture or dislocation. Normal alignment. No joint
effusion. Mild osteoarthritis of the first MTP joint. Small amount
of fluid in the intermetatarsal bursa between the first and second
metatarsal heads.

Ligaments

Collateral ligaments are intact.

Muscles and Tendons
Flexor, peroneal and extensor compartment tendons are intact.
Muscles are normal.

Soft tissue
No fluid collection or hematoma.  No soft tissue mass.
IMPRESSION: 1. Comminuted healing fracture of the distal aspect of the first
proximal phalanx with mild surrounding marrow edema. Mild adjacent
reactive marrow edema at the base of the first proximal phalanx.
2. Mild intermetatarsal bursitis between the first and second
metatarsal heads.

## 2018-03-04 DIAGNOSIS — G8929 Other chronic pain: Secondary | ICD-10-CM | POA: Diagnosis not present

## 2018-03-04 DIAGNOSIS — M545 Low back pain: Secondary | ICD-10-CM | POA: Diagnosis not present

## 2018-03-15 ENCOUNTER — Other Ambulatory Visit: Payer: Self-pay | Admitting: Physician Assistant

## 2018-03-15 DIAGNOSIS — F419 Anxiety disorder, unspecified: Secondary | ICD-10-CM

## 2018-03-18 DIAGNOSIS — M545 Low back pain: Secondary | ICD-10-CM | POA: Diagnosis not present

## 2018-03-18 DIAGNOSIS — M479 Spondylosis, unspecified: Secondary | ICD-10-CM | POA: Diagnosis not present

## 2018-03-18 DIAGNOSIS — F329 Major depressive disorder, single episode, unspecified: Secondary | ICD-10-CM | POA: Diagnosis not present

## 2018-03-18 DIAGNOSIS — G8929 Other chronic pain: Secondary | ICD-10-CM | POA: Diagnosis not present

## 2018-04-07 ENCOUNTER — Encounter: Payer: Self-pay | Admitting: Family Medicine

## 2018-04-07 ENCOUNTER — Ambulatory Visit: Payer: BLUE CROSS/BLUE SHIELD | Admitting: Family Medicine

## 2018-04-07 VITALS — BP 112/59 | HR 70 | Ht 66.0 in | Wt 175.0 lb

## 2018-04-07 DIAGNOSIS — Z23 Encounter for immunization: Secondary | ICD-10-CM

## 2018-04-07 DIAGNOSIS — M7581 Other shoulder lesions, right shoulder: Secondary | ICD-10-CM

## 2018-04-07 NOTE — Progress Notes (Signed)
Kimberly Wade is a 47 y.o. female who presents to [facility] today for right shoulder pain. 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  She notes that her right shoulder is been bothering her for about a week now.  She cannot think of any injury or exacerbation that may have caused her pain.  She notes the pain is located in the lateral upper arm and is worse with overhead activity and at night and reaching back.  She denies any radiating pain weakness or numbness distally, fevers or chills.  She notes that her current right shoulder symptoms are similar to her symptoms in her left shoulder prior to her rotator cuff repair.  She has already tried some home exercises for her right shoulder which are not helping and too painful to perform regularly.  She notes Tylenol is not very helpful.  The pain is interfering with activities at home and at work.  She is an 34-month-old baby at home and notes that picking the child up is very difficult and painful.     ROS:  As above  Exam:  BP (!) 112/59   Pulse 70   Ht 5\' 6"  (1.676 m)   Wt 175 lb (79.4 kg)   BMI 28.25 kg/m  General: Well Developed, well nourished, and in no acute distress.  Neuro/Psych: Alert and oriented x3, extra-ocular muscles intact, able to move all 4 extremities, sensation grossly intact. Skin: Warm and dry, no rashes noted.  Respiratory: Not using accessory muscles, speaking in full sentences, trachea midline.  Cardiovascular: Pulses palpable, no extremity edema. Abdomen: Does not appear distended. MSK:  C-spine: Nontender to midline normal neck motion normal appearance. Right shoulder normal-appearing no deformity. Not particularly tender. Range of motion: Full active range of motion following injection.  Pain with abduction prior to injection. Positive Hawkins and Neer's test. Positive  empty can test. Strength decreased 4/5 abduction.  Normal external and internal rotational strength. Pulses capillary fill and sensation are intact distally.    Lab and Radiology Results  Procedure: Real-time Ultrasound Guided Injection of right subacromial bursa  Device: GE Logiq E   Images permanently stored and available for review in the ultrasound unit. Verbal informed consent obtained.  Discussed risks and benefits of procedure. Warned about infection bleeding damage to structures skin hypopigmentation and fat atrophy among others. Patient expresses understanding and agreement Time-out conducted.   Noted no overlying erythema, induration, or other signs of local infection.   Skin prepped in a sterile fashion.   Local anesthesia: Topical Ethyl chloride.   With sterile technique and under real time ultrasound guidance:  40mg  kenalog and 18ml marcaine injected easily.   Completed without difficulty   Pain immediately resolved suggesting accurate placement of the medication.   Advised to call if fevers/chills, erythema, induration, drainage, or persistent bleeding.   Images permanently stored and available for review in the ultrasound unit.  Impression: Technically successful ultrasound guided injection.    Assessment and Plan: 47 y.o. female with  Right shoulder pain very likely rotator cuff tendinopathy.  Patient has enough pain that she is not able to complete her home therapy exercises that worked well for her left shoulder.  Plan to proceed with subacromial injection as noted above.  Patient had excellent symptom resolution following injection of Marcaine and triamcinolone into the subacromial space.  This supports diagnosis of tendinopathy or subacromial bursitis.  New problem  uncertain prognosis.  Plan to proceed with home exercise program.  Recheck in 1 month.  Return sooner if needed.  Influenza vaccine given today prior to discharge. Orders Placed This Encounter    Procedures  . Flu Vaccine QUAD 36+ mos IM    [place]   No orders of the defined types were placed in this encounter.   Historical information moved to improve visibility of documentation.  Past Medical History:  Diagnosis Date  . Anemia   . Anxiety   . Arthritis   . Chronic pain    has pain contract for Norco 5/325mg   . GERD (gastroesophageal reflux disease)   . Hyperlipidemia   . Hypertension   . Insomnia   . Pneumococcal pneumonia ([place]) 2000  . Pneumonia    Past Surgical History:  Procedure Laterality Date  . CESAREAN SECTION     x4  . CHEST TUBE INSERTION    . chest tubes  2000  . dysplastic mole removed     right ankle  . GASTRIC BYPASS    . INTRAUTERINE DEVICE (IUD) INSERTION N/A 07/11/2014   Procedure: INTRAUTERINE DEVICE (IUD) INSERTION;  Surgeon: Emily Filbert, MD;  Location: [facility];  Service: Gynecology;  Laterality: N/A;  . IUD REMOVAL N/A 07/11/2014   Procedure: INTRAUTERINE DEVICE (IUD) REMOVAL;  Surgeon: Emily Filbert, MD;  Location: [facility];  Service: Gynecology;  Laterality: N/A;  . mini gastric bypass  2011  . recoonstruction of right lung    . SHOULDER ARTHROSCOPY WITH SUBACROMIAL DECOMPRESSION, ROTATOR CUFF REPAIR AND BICEP TENDON REPAIR Left 05/20/2017   Procedure: LEFT SHOULDER ARTHROSCOPY WITH DEBRIDEMENT, ACROMIOPLASTY, DISTAL CLAVICAL EXCISION, ROTATOR CUFF REPAIR, OPEN BICEP TENODESIS;  Surgeon: Hiram Gash, MD;  Location: [facility];  Service: Orthopedics;  Laterality: Left;  [name] VIDEO BRONCHOSCOPY Bilateral 04/20/2013   Procedure: VIDEO BRONCHOSCOPY WITH FLUORO;  Surgeon: Elsie Stain, MD;  Location: [facility];  Service: Cardiopulmonary;  Laterality: Bilateral;   Social History   Tobacco Use  . Smoking status: Never Smoker  . Smokeless tobacco: Never Used  Substance Use Topics  . Alcohol use: Yes    Comment: 1 glass per month   family history includes Asthma in her daughter; Colon cancer in her unknown relative; Diabetes  in her father; Heart attack in her unknown relative; Hyperlipidemia in her unknown relative; Hypertension in her father and mother.  Medications: Current Outpatient Medications  Medication Sig Dispense Refill  . ALPRAZolam (XANAX) 1 MG tablet TAKE 1 TABLET BY MOUTH 2 TIMES DAILY AS NEEDED FOR ANXIETY OR SLEEP 45 tablet 2  . AMBULATORY NON FORMULARY MEDICATION Glucometer lancets and test strips to test sugars once a day for excessive sleepiness with concern for low blood sugar issues. 100 strip 5  . Biotin 5000 MCG TABS Take 1 tablet by mouth 2 (two) times daily.    . busPIRone (BUSPAR) 7.5 MG tablet TAKE ONE TABLET BY MOUTH THREE TIMES A DAY 90 tablet 4  . Calcium Citrate-Vitamin D (CALCIUM CITRATE + PO) Take 1 tablet by mouth daily.     . Diclofenac Sodium (PENNSAID) 2 % SOLN Place 1 application onto the skin 2 (two) times daily. 1 Bottle 5  . ferrous sulfate 325 (65 FE) MG tablet Take 1 tablet (325 mg total) by mouth 2 (two) times daily with a meal. 180 tablet 0  . hydrochlorothiazide (HYDRODIURIL) 25 MG tablet TAKE 1 TABLET BY MOUTH DAILY AS NEEDED FOR LOWER EXTREMITY SWELLING 90 tablet 1  .  HYDROcodone-acetaminophen (NORCO/VICODIN) 5-325 MG tablet Take 1 tablet by mouth 3 (three) times daily with meals. 90 tablet 0  . Lidocaine HCl 2 % SOLN Use as directed 5-10 mLs in the mouth or throat every 3 (three) hours as needed (mouth/throat pain). 100 mL 1  . lisinopril-hydrochlorothiazide (ZESTORETIC) 20-12.5 MG tablet Take 1 tablet by mouth daily. 90 tablet 3  . Multiple Vitamin (MULTIVITAMIN) tablet Take 1 tablet by mouth 3 (three) times daily.    . ondansetron (ZOFRAN-ODT) 8 MG disintegrating tablet Take 1 tablet (8 mg total) by mouth every 8 (eight) hours as needed for nausea. 20 tablet 3  . pyridOXINE (VITAMIN B-6) 25 MG tablet Take by mouth.    . venlafaxine XR (EFFEXOR-XR) 150 MG 24 hr capsule TAKE ONE CAPSULE BY MOUTH EVERY MORNING WITH BREAKFAST. 90 capsule 2  . Vitamin D, Ergocalciferol,  (DRISDOL) 50000 units CAPS capsule TAKE 1 CAPSULE BY MOUTH EVERY 7 DAYS. 12 capsule 3   No current facility-administered medications for this visit.    Allergies  Allergen Reactions  . Levofloxacin Hives  . Penicillins Hives  . Levaquin [Levofloxacin In D5w] Hives    Has tolerated avelox      Discussed warning signs or symptoms. Please see discharge instructions. Patient expresses understanding.

## 2018-04-07 NOTE — Patient Instructions (Signed)
Thank you for coming in today. Continue the home exercises for the shoulder that you learned with PT.  Recheck with me in 4 weeks or sooner if needed.  If not doing well let me know sooner.   Call or go to the ER if you develop a large red swollen joint with extreme pain or oozing puss.    Rotator Cuff Tear Rehab After Surgery Ask your health care provider which exercises are safe for you. Do exercises exactly as told by your health care provider and adjust them as directed. It is normal to feel mild stretching, pulling, tightness, or discomfort as you do these exercises, but you should stop right away if you feel sudden pain or your pain gets worse. Do not begin these exercises until told by your health care provider. Stretching and range of motion exercises These exercises warm up your muscles and joints and improve the movement and flexibility of your shoulder. These exercises also help to relieve pain, numbness, and tingling. Exercise A: Pendulum  1. Stand near a wall or a surface that you can hold onto for balance. 2. Bend at the waist and let your left / right arm hang straight down. Use your other arm to keep your balance. 3. Relax your arm and shoulder muscles, and move your hips and your trunk so your left / right arm swings freely. Your arm should swing because of the motion of your body, not because you are using your arm or shoulder muscles. 4. Keep moving so your arm swings in the following directions, as told by your health care provider: ? Side to side. ? Forward and backward. ? In clockwise and counterclockwise circles. Repeat __________ times, or for __________ seconds per direction. Complete this exercise __________ times a day. Exercise B: Flexion, seated  1. Sit in a stable chair so your left / right forearm can rest on a flat surface. Your elbow should rest at a height that keeps your upper arm next to your body. 2. Keeping your shoulder relaxed, lean forward at the  waist and let your hand slide forward. Stop when you feel a stretch in your shoulder, or when you reach the angle that is recommended by your health care provider. 3. Hold for __________ seconds. 4. Slowly return to the starting position. Repeat __________ times. Complete this exercise __________ times a day. Exercise C: Flexion, standing  1. Stand and hold a broomstick, a cane, or a similar object. Place your hands a little more than shoulder-width apart on the object. Your left / right hand should be palm-up, and your other hand should be palm-down. 2. Push the stick down with your healthy arm to raise your left / right arm in front of your body, and then over your head. Use your other hand to help move the stick. Stop when you feel a stretch in your shoulder, or when you reach the angle that is recommended by your health care provider. ? Avoid shrugging your shoulder while you raise your arm. Keep your shoulder blade tucked down toward your spine. ? Keep your left / right shoulder muscles relaxed. 3. Hold for __________ seconds. 4. Slowly return to the starting position. Repeat __________ times. Complete this exercise __________ times a day. Exercise D: Abduction, supine  1. Lie on your back and hold a broomstick, a cane, or a similar object. Place your hands a little more than shoulder-width apart on the object. Your left / right hand should be palm-up, and your  other hand should be palm-down. 2. Push the stick to raise your left / right arm out to your side and then over your head. Use your other hand to help move the stick. Stop when you feel a stretch in your shoulder, or when you reach the angle that is recommended by your health care provider. ? Avoid shrugging your shoulder while you raise your arm. Keep your shoulder blade tucked down toward your spine. 3. Hold for __________ seconds. 4. Slowly return to the starting position. Repeat __________ times. Complete this exercise __________  times a day. Exercise E: Shoulder flexion, active-assisted  1. Lie on your back. You may bend your knees for comfort. 2. Hold a broomstick, a cane, or a similar object so your hands are about shoulder-width apart. Your palms should face toward your feet. 3. Raise your left / right arm over your head and behind your head, toward the floor. Use your other hand to help you do this. Stop when you feel a gentle stretch in your shoulder, or when you reach the angle that is recommended by your health care provider. 4. Hold for __________ seconds. 5. Use the broomstick and your other arm to help you return your left / right arm to the starting position. Repeat __________ times. Complete this exercise __________ times a day. Exercise F: External rotation  1. Sit in a stable chair without armrests, or stand. 2. Tuck a soft object, such as a folded towel or a small ball, under your left / right upper arm. 3. Hold a broomstick, a cane, or a similar object so your palms face down, toward the floor. Bend your elbows to an "L" shape (90 degrees), and keep your hands about shoulder-width apart. 4. Straighten your healthy arm and push the broomstick across your body, toward your left / right side. Keep your left / right arm bent. This will rotate your left / right forearm away from your body. 5. Hold for __________ seconds. 6. Slowly return to the starting position. Repeat __________ times. Complete this exercise __________ times a day. Strengthening exercises These exercises build strength and endurance in your shoulder. Endurance is the ability to use your muscles for a long time, even after they get tired. Exercise G: Shoulder flexion, isometric  1. Stand or sit about 4-6 inches (10-15 cm) away from a wall with your left / right side facing the wall. 2. Gently make a fist and place your left / right hand on the wall so the top of your fist touches the wall. 3. With your left / right elbow straight, gently  press the top of your fist into the wall. Gradually increase the pressure until you are pressing as hard as you can without shrugging your shoulder. 4. Hold for __________ seconds. 5. Slowly release the tension and relax your muscles completely before you repeat the exercise. Repeat __________ times. Complete this exercise __________ times a day. Exercise H: Shoulder abduction, isometric  1. Stand or sit about 4-6 inches (10-15 cm) away from a wall with your right/left side facing the wall. 2. Bend your left / right elbow and gently press your elbow into the wall as if you are trying to move your arm out to your side. Increase the pressure gradually until you are pressing as hard as you can without shrugging your shoulder. 3. Hold for __________ seconds. 4. Slowly release the tension and relax your muscles completely before repeating the exercise. Repeat __________ times. Complete this exercise __________ times  a day. Exercise I: Internal rotation, isometric  1. Stand or sit in a doorway, facing the door frame. 2. Bend your left / right elbow and place the palm of your hand against the door frame. Only your palm should be touching the frame. Keep your upper arm at your side. 3. Gently press your hand into the door frame, as if you are trying to push your arm toward your abdomen. Do not let your wrist bend. ? Avoid shrugging your shoulder while you press your hand into the door frame. Keep your shoulder blade tucked down toward the middle of your back. 4. Hold for __________ seconds. 5. Slowly release the tension, and relax your muscles completely before you repeat the exercise. Repeat __________ times. Complete this exercise __________ times a day. Exercise J: External rotation, isometric  1. Stand or sit in a doorway, facing the door frame. 2. Bend your left / right elbow and place the back of your wrist against the door frame. Only the back of your wrist should be touching the frame. Keep  your upper arm at your side. 3. Gently press your wrist against the door frame, as if you are trying to push your arm away from your abdomen. ? Avoid shrugging your shoulder while you press your wrist into the door frame. Keep your shoulder blade tucked down toward the middle of your back. 4. Hold for __________ seconds. 5. Slowly release the tension, and relax your muscles completely before you repeat the exercise. Repeat __________ times. Complete this exercise __________ times a day. This information is not intended to replace advice given to you by your health care provider. Make sure you discuss any questions you have with your health care provider. Document Released: 08/04/2005 Document Revised: 04/10/2016 Document Reviewed: 08/18/2015 Elsevier Interactive Patient Education  [employer].

## 2018-04-13 DIAGNOSIS — G8929 Other chronic pain: Secondary | ICD-10-CM | POA: Diagnosis not present

## 2018-04-13 DIAGNOSIS — Z79899 Other long term (current) drug therapy: Secondary | ICD-10-CM | POA: Diagnosis not present

## 2018-04-13 DIAGNOSIS — Z6829 Body mass index (BMI) 29.0-29.9, adult: Secondary | ICD-10-CM | POA: Diagnosis not present

## 2018-04-13 DIAGNOSIS — M25519 Pain in unspecified shoulder: Secondary | ICD-10-CM | POA: Diagnosis not present

## 2018-05-12 DIAGNOSIS — Z6829 Body mass index (BMI) 29.0-29.9, adult: Secondary | ICD-10-CM | POA: Diagnosis not present

## 2018-05-12 DIAGNOSIS — Z79899 Other long term (current) drug therapy: Secondary | ICD-10-CM | POA: Diagnosis not present

## 2018-05-12 DIAGNOSIS — G8929 Other chronic pain: Secondary | ICD-10-CM | POA: Diagnosis not present

## 2018-05-12 DIAGNOSIS — M25519 Pain in unspecified shoulder: Secondary | ICD-10-CM | POA: Diagnosis not present

## 2018-05-24 ENCOUNTER — Encounter: Payer: Self-pay | Admitting: Family Medicine

## 2018-05-24 ENCOUNTER — Ambulatory Visit (INDEPENDENT_AMBULATORY_CARE_PROVIDER_SITE_OTHER): Payer: BLUE CROSS/BLUE SHIELD | Admitting: Family Medicine

## 2018-05-24 VITALS — BP 115/60 | HR 73 | Temp 98.4°F | Ht 66.0 in | Wt 166.0 lb

## 2018-05-24 DIAGNOSIS — J069 Acute upper respiratory infection, unspecified: Secondary | ICD-10-CM

## 2018-05-24 MED ORDER — HYDROCODONE-HOMATROPINE 5-1.5 MG/5ML PO SYRP: 5.0000 mL | ORAL_SOLUTION | Freq: Two times a day (BID) | ORAL | 0 refills | Status: DC | PRN

## 2018-05-24 NOTE — Progress Notes (Signed)
   Subjective:    Patient ID: Kimberly Wade, female    DOB: 1971-03-11, 47 y.o.   MRN: [id number]  HPI 47 year old female comes in today complaining of URI sxs that started 4 days ago.  No fever or chills.  She has had nasal congestion and pressure across both eyes, some runny nose as well as cough.  She is been very fatigued.  No sore throat.  Her ears have been ringing.  She is been using some equivalent of DayQuil and NyQuil and using some eucalyptus in a oral diffuser at night.   Review of Systems     Objective:   Physical Exam  Constitutional: She is oriented to person, place, and time. She appears well-developed and well-nourished.  HENT:  Head: Normocephalic and atraumatic.  Right Ear: External ear normal.  Left Ear: External ear normal.  Nose: Nose normal.  Mouth/Throat: Oropharynx is clear and moist.  TMs and canals are clear.   Eyes: Pupils are equal, round, and reactive to light. Conjunctivae and EOM are normal.  Neck: Neck supple. No thyromegaly present.  Cardiovascular: Normal rate, regular rhythm and normal heart sounds.  Pulmonary/Chest: Effort normal and breath sounds normal. She has no wheezes.  Lymphadenopathy:    She has no cervical adenopathy.  Neurological: She is alert and oriented to person, place, and time.  Skin: Skin is warm and dry.  Psychiatric: She has a normal mood and affect.          Assessment & Plan:  Respiratory infection-at this point I really feel like this is most consistent with a viral illness.  Lungs are clear on auscultation she does have a prior history of recurrent pneumonia so I do want to be very cautious about that.  If at any point she feels like she is getting worse or she is not improving this week then please give [place] a call back.  In the meantime I did give her some hydrocodone cough syrup to use up to twice a day as needed.  Did warn about potential for sedation with the medication.

## 2018-06-03 ENCOUNTER — Ambulatory Visit (INDEPENDENT_AMBULATORY_CARE_PROVIDER_SITE_OTHER): Payer: BLUE CROSS/BLUE SHIELD | Admitting: Sports Medicine

## 2018-06-03 DIAGNOSIS — S46211A Strain of muscle, fascia and tendon of other parts of biceps, right arm, initial encounter: Secondary | ICD-10-CM | POA: Diagnosis not present

## 2018-06-03 NOTE — Assessment & Plan Note (Signed)
Minimal discomfort. Strap with compressive dressing. Rehab exercises given. No operative intervention is needed for this, simply conservative rehabilitation. Return to see me in a month.

## 2018-06-03 NOTE — Progress Notes (Signed)
Subjective:    I'm seeing this patient as a consultation for: Kimberly Planas, PA-C  CC: Right arm injury  HPI: This is a pleasant 47 year old female, several days ago while lifting her dog she felt a pop on the proximal right upper arm.  Immediate deformity, followed by bruising and pain.  Pain is improving slowly.  Symptoms are mild, improving.  I reviewed the past medical history, family history, social history, surgical history, and allergies today and no changes were needed.  Please see the problem list section below in epic for further details.  Past Medical History: Past Medical History:  Diagnosis Date  . Anemia   . Anxiety   . Arthritis   . Chronic pain    has pain contract for Norco 5/325mg   . GERD (gastroesophageal reflux disease)   . Hyperlipidemia   . Hypertension   . Insomnia   . Pneumococcal pneumonia ([place]) 2000  . Pneumonia    Past Surgical History: Past Surgical History:  Procedure Laterality Date  . CESAREAN SECTION     x4  . CHEST TUBE INSERTION    . chest tubes  2000  . dysplastic mole removed     right ankle  . GASTRIC BYPASS    . INTRAUTERINE DEVICE (IUD) INSERTION N/A 07/11/2014   Procedure: INTRAUTERINE DEVICE (IUD) INSERTION;  Surgeon: Emily Filbert, MD;  Location: [facility];  Service: Gynecology;  Laterality: N/A;  . IUD REMOVAL N/A 07/11/2014   Procedure: INTRAUTERINE DEVICE (IUD) REMOVAL;  Surgeon: Emily Filbert, MD;  Location: [facility];  Service: Gynecology;  Laterality: N/A;  . mini gastric bypass  2011  . recoonstruction of right lung    . SHOULDER ARTHROSCOPY WITH SUBACROMIAL DECOMPRESSION, ROTATOR CUFF REPAIR AND BICEP TENDON REPAIR Left 05/20/2017   Procedure: LEFT SHOULDER ARTHROSCOPY WITH DEBRIDEMENT, ACROMIOPLASTY, DISTAL CLAVICAL EXCISION, ROTATOR CUFF REPAIR, OPEN BICEP TENODESIS;  Surgeon: Hiram Gash, MD;  Location: [facility];  Service: Orthopedics;  Laterality: Left;  [name] VIDEO BRONCHOSCOPY Bilateral 04/20/2013   Procedure:  VIDEO BRONCHOSCOPY WITH FLUORO;  Surgeon: Elsie Stain, MD;  Location: [facility];  Service: Cardiopulmonary;  Laterality: Bilateral;   Social History: Social History   Socioeconomic History  . Marital status: Legally Separated    Spouse name: Not on file  . Number of children: Not on file  . Years of education: Not on file  . Highest education level: Not on file  Occupational History  . Occupation: Furniture conservator/restorer: [employer]  . Financial resource strain: Not on file  . Food insecurity:    Worry: Not on file    Inability: Not on file  . Transportation needs:    Medical: Not on file    Non-medical: Not on file  Tobacco Use  . Smoking status: Never Smoker  . Smokeless tobacco: Never Used  Substance and Sexual Activity  . Alcohol use: Yes    Comment: 1 glass per month  . Drug use: No  . Sexual activity: Yes    Partners: Male    Birth control/protection: IUD  Lifestyle  . Physical activity:    Days per week: Not on file    Minutes per session: Not on file  . Stress: Not on file  Relationships  . Social connections:    Talks on phone: Not on file    Gets together: Not on file    Attends religious service: Not on file    Active member  of club or organization: Not on file    Attends meetings of clubs or organizations: Not on file    Relationship status: Not on file  Other Topics Concern  . Not on file  Social History Narrative  . Not on file   Family History: Family History  Problem Relation Age of Onset  . Colon cancer Unknown        grandmother  . Heart attack Unknown        grandfather  . Hyperlipidemia Unknown   . Diabetes Father        father  . Hypertension Father   . Hypertension Mother   . Asthma Daughter    Allergies: Allergies  Allergen Reactions  . Levofloxacin Hives  . Penicillins Hives  . Levaquin [Levofloxacin In D5w] Hives    Has tolerated avelox   Medications: See med rec.  Review of Systems:  No headache, visual changes, nausea, vomiting, diarrhea, constipation, dizziness, abdominal pain, skin rash, fevers, chills, night sweats, weight loss, swollen lymph nodes, body aches, joint swelling, muscle aches, chest pain, shortness of breath, mood changes, visual or auditory hallucinations.   Objective:   General: Well Developed, well nourished, and in no acute distress.  Neuro:  Extra-ocular muscles intact, able to move all 4 extremities, sensation grossly intact.  Deep tendon reflexes tested were normal. Psych: Alert and oriented, mood congruent with affect. ENT:  Ears and nose appear unremarkable.  Hearing grossly normal. Neck: Unremarkable overall appearance, trachea midline.  No visible thyroid enlargement. Eyes: Conjunctivae and lids appear unremarkable.  Pupils equal and round. Skin: Warm and dry, no rashes noted.  Cardiovascular: Pulses palpable, no extremity edema. Right arm: Visible Popeye's deformity, bruising, minimal tenderness, good strength elbow flexion, weakness to supination with a positive Yergason and speeds test all as expected.  The right upper arm was strapped with a compressive dressing.  Impression and Recommendations:   This case required medical decision making of moderate complexity.  Biceps rupture, proximal, right, initial encounter Minimal discomfort. Strap with compressive dressing. Rehab exercises given. No operative intervention is needed for this, simply conservative rehabilitation. Return to see me in a month. ___________________________________________ Gwen Her. Dianah Field, M.D., ABFM., CAQSM. Primary Care and Sports Medicine [facility]  Adjunct Professor of [employer]

## 2018-06-03 NOTE — Patient Instructions (Signed)
Biceps Tendon Disruption (Proximal) The proximal biceps tendon is a strong cord of tissue that connects the biceps muscle, on the front of the upper arm, to the shoulder blade. A proximal biceps tendon disruption can include a partial or complete tear of the tendon near where it connects to the bone near the shoulder. A proximal biceps tendon disruption can interfere with the ability to lift the arm in front of the body, stabilize the shoulder, bend the elbow, and turn the hand palm-up (supination). What are the causes? A biceps tendon disruption happens when the tendon is exposed to too much force. This excess force may be caused by:  The elbow being suddenly straightened from a bent position because of an external force. This could happen, for example, while catching a heavy weight or being pulled when waterskiing.  Wear and tear from physical activity.  Breaking a fall with your hand.  What increases the risk? The following factors may make you more likely to develop this condition:  Playing contact sports.  Doing activities or sports that involve throwing or overhead movements, such as racket sports, gymnastics, or baseball.  Doing activities or sports that involve putting sudden force on the arm, such as weightlifting or waterskiing.  Having a weakened tendon. The tendon may be weak because of: ? Long-lasting (chronic) biceps tendinitis. ? Certain medical conditions, such as diabetes or rheumatoid arthritis. ? Repeated corticosteroid use. ? Repetitive overhead movements.  What are the signs or symptoms? Symptoms of this condition may include:  Sudden sharp pain in the front of the shoulder. Pain may get worse during certain movements, such as: ? Lifting or carrying objects. ? Straightening the elbow. ? Throwing or using overhead movements.  Inflammation or a feeling of unusual warmth on the front of the shoulder.  Painful tightening (spasm) of the biceps muscle.  A bulge on  the inside of the upper arm when the elbow is bent.  Bruising in the shoulder or upper arm. This may develop 24-48 hours after the tendon is injured.  Limited range of motion of the shoulder and elbow.  Weakness in the elbow and forearm when: ? Bending the elbow. ? Rotating the wrist.  How is this diagnosed? This condition is diagnosed based on your symptoms, your medical history, and a physical exam. Your health care provider may test the strength and range of motion of your shoulder and elbow. You may have imaging tests, such as X-rays, MRI, or ultrasound. How is this treated? This condition is treated by resting and icing the injured area, and by doing physical therapy exercises. Depending on the severity of your condition, treatment may also include:  Medicines to help relieve pain and inflammation.  Avoiding certain activities that put stress on your shoulder.  One or more injections of medicines (corticosteroids) into your upper arm to help reduce inflammation (rare).  Surgery to repair the tear. This may be needed if nonsurgical treatments do not improve your condition.  Follow these instructions at home: Managing pain, stiffness, and swelling  If directed, put ice on the injured area: ? Put ice in a plastic bag. ? Place a towel between your skin and the bag. ? Leave the ice on for 20 minutes, 2-3 times a day.  Move your fingers often to avoid stiffness and to lessen swelling.  Raise (elevate) the injured area while you are sitting or lying down. Activity  Return to your normal activities as told by your health care provider. Ask your health   care provider what activities are safe for you.  Avoid activities that cause pain or make your condition worse.  Do not lift anything that is heavier than 10 lb (4.5 kg) until your health care provider approves.  Do exercises as told by your health care provider. General instructions  Take over-the-counter and prescription  medicines only as told by your health care provider.  Do not drive or operate heavy machinery while taking prescription pain medicines.  Keep all follow-up visits as told by your health care provider. This is important. How is this prevented?  Warm up and stretch before being active.  Cool down and stretch after being active.  Give your body time to rest between periods of activity.  Make sure to use equipment that fits you.  Be safe and responsible while being active to avoid falls.  Maintain physical fitness, including strength and flexibility. Contact a health care provider if:  You have symptoms that get worse or do not get better after 2 weeks of treatment.  You develop new symptoms. Get help right away if:  You have severe pain.  You develop pain or numbness in your hand.  Your hand feels unusually cold.  Your fingernails turn a dark color, such as blue or gray. This information is not intended to replace advice given to you by your health care provider. Make sure you discuss any questions you have with your health care provider. Document Released: 08/04/2005 Document Revised: 04/10/2016 Document Reviewed: 07/13/2015 Elsevier Interactive Patient Education  2018 Elsevier Inc.  

## 2018-06-15 ENCOUNTER — Other Ambulatory Visit: Payer: Self-pay | Admitting: Physician Assistant

## 2018-06-15 DIAGNOSIS — I10 Essential (primary) hypertension: Secondary | ICD-10-CM

## 2018-06-22 DIAGNOSIS — S46219A Strain of muscle, fascia and tendon of other parts of biceps, unspecified arm, initial encounter: Secondary | ICD-10-CM | POA: Diagnosis not present

## 2018-06-22 DIAGNOSIS — G8929 Other chronic pain: Secondary | ICD-10-CM | POA: Diagnosis not present

## 2018-06-22 DIAGNOSIS — Z79899 Other long term (current) drug therapy: Secondary | ICD-10-CM | POA: Diagnosis not present

## 2018-06-22 DIAGNOSIS — M25519 Pain in unspecified shoulder: Secondary | ICD-10-CM | POA: Diagnosis not present

## 2018-07-05 ENCOUNTER — Ambulatory Visit: Payer: BLUE CROSS/BLUE SHIELD | Admitting: Sports Medicine

## 2018-07-19 DIAGNOSIS — M75102 Unspecified rotator cuff tear or rupture of left shoulder, not specified as traumatic: Secondary | ICD-10-CM | POA: Diagnosis not present

## 2018-07-19 DIAGNOSIS — S46219A Strain of muscle, fascia and tendon of other parts of biceps, unspecified arm, initial encounter: Secondary | ICD-10-CM | POA: Diagnosis not present

## 2018-07-19 DIAGNOSIS — E778 Other disorders of glycoprotein metabolism: Secondary | ICD-10-CM | POA: Diagnosis not present

## 2018-07-19 DIAGNOSIS — Z79899 Other long term (current) drug therapy: Secondary | ICD-10-CM | POA: Diagnosis not present

## 2018-07-19 DIAGNOSIS — M12812 Other specific arthropathies, not elsewhere classified, left shoulder: Secondary | ICD-10-CM | POA: Diagnosis not present

## 2018-07-25 ENCOUNTER — Other Ambulatory Visit: Payer: Self-pay | Admitting: Physician Assistant

## 2018-07-25 DIAGNOSIS — F419 Anxiety disorder, unspecified: Secondary | ICD-10-CM

## 2018-07-25 DIAGNOSIS — F411 Generalized anxiety disorder: Secondary | ICD-10-CM

## 2018-08-09 DIAGNOSIS — M75102 Unspecified rotator cuff tear or rupture of left shoulder, not specified as traumatic: Secondary | ICD-10-CM | POA: Diagnosis not present

## 2018-08-09 DIAGNOSIS — S46219A Strain of muscle, fascia and tendon of other parts of biceps, unspecified arm, initial encounter: Secondary | ICD-10-CM | POA: Diagnosis not present

## 2018-08-09 DIAGNOSIS — E778 Other disorders of glycoprotein metabolism: Secondary | ICD-10-CM | POA: Diagnosis not present

## 2018-08-09 DIAGNOSIS — M12812 Other specific arthropathies, not elsewhere classified, left shoulder: Secondary | ICD-10-CM | POA: Diagnosis not present

## 2018-08-09 DIAGNOSIS — Z79899 Other long term (current) drug therapy: Secondary | ICD-10-CM | POA: Diagnosis not present

## 2018-09-03 DIAGNOSIS — Z9884 Bariatric surgery status: Secondary | ICD-10-CM | POA: Diagnosis not present

## 2018-09-03 DIAGNOSIS — M75102 Unspecified rotator cuff tear or rupture of left shoulder, not specified as traumatic: Secondary | ICD-10-CM | POA: Diagnosis not present

## 2018-09-03 DIAGNOSIS — E559 Vitamin D deficiency, unspecified: Secondary | ICD-10-CM | POA: Diagnosis not present

## 2018-09-03 DIAGNOSIS — E039 Hypothyroidism, unspecified: Secondary | ICD-10-CM | POA: Diagnosis not present

## 2018-09-03 DIAGNOSIS — Z79899 Other long term (current) drug therapy: Secondary | ICD-10-CM | POA: Diagnosis not present

## 2018-09-03 DIAGNOSIS — H6690 Otitis media, unspecified, unspecified ear: Secondary | ICD-10-CM | POA: Diagnosis not present

## 2018-09-03 DIAGNOSIS — R300 Dysuria: Secondary | ICD-10-CM | POA: Diagnosis not present

## 2018-09-03 DIAGNOSIS — R0602 Shortness of breath: Secondary | ICD-10-CM | POA: Diagnosis not present

## 2018-09-03 DIAGNOSIS — G5621 Lesion of ulnar nerve, right upper limb: Secondary | ICD-10-CM | POA: Diagnosis not present

## 2018-09-03 DIAGNOSIS — M12812 Other specific arthropathies, not elsewhere classified, left shoulder: Secondary | ICD-10-CM | POA: Diagnosis not present

## 2018-09-07 DIAGNOSIS — S46219A Strain of muscle, fascia and tendon of other parts of biceps, unspecified arm, initial encounter: Secondary | ICD-10-CM | POA: Diagnosis not present

## 2018-09-07 DIAGNOSIS — G5621 Lesion of ulnar nerve, right upper limb: Secondary | ICD-10-CM | POA: Diagnosis not present

## 2018-09-07 DIAGNOSIS — M75102 Unspecified rotator cuff tear or rupture of left shoulder, not specified as traumatic: Secondary | ICD-10-CM | POA: Diagnosis not present

## 2018-09-07 DIAGNOSIS — M12812 Other specific arthropathies, not elsewhere classified, left shoulder: Secondary | ICD-10-CM | POA: Diagnosis not present

## 2018-09-28 DIAGNOSIS — M7710 Lateral epicondylitis, unspecified elbow: Secondary | ICD-10-CM | POA: Diagnosis not present

## 2018-09-28 DIAGNOSIS — G5621 Lesion of ulnar nerve, right upper limb: Secondary | ICD-10-CM | POA: Diagnosis not present

## 2018-09-28 DIAGNOSIS — M75102 Unspecified rotator cuff tear or rupture of left shoulder, not specified as traumatic: Secondary | ICD-10-CM | POA: Diagnosis not present

## 2018-09-28 DIAGNOSIS — S46219A Strain of muscle, fascia and tendon of other parts of biceps, unspecified arm, initial encounter: Secondary | ICD-10-CM | POA: Diagnosis not present

## 2018-09-28 DIAGNOSIS — Z79899 Other long term (current) drug therapy: Secondary | ICD-10-CM | POA: Diagnosis not present

## 2018-10-14 ENCOUNTER — Other Ambulatory Visit: Payer: Self-pay | Admitting: Physician Assistant

## 2018-10-14 DIAGNOSIS — F411 Generalized anxiety disorder: Secondary | ICD-10-CM

## 2018-10-14 DIAGNOSIS — F419 Anxiety disorder, unspecified: Secondary | ICD-10-CM

## 2018-10-25 DIAGNOSIS — M7710 Lateral epicondylitis, unspecified elbow: Secondary | ICD-10-CM | POA: Diagnosis not present

## 2018-10-25 DIAGNOSIS — M545 Low back pain: Secondary | ICD-10-CM | POA: Diagnosis not present

## 2018-10-25 DIAGNOSIS — G5621 Lesion of ulnar nerve, right upper limb: Secondary | ICD-10-CM | POA: Diagnosis not present

## 2018-10-25 DIAGNOSIS — S46219A Strain of muscle, fascia and tendon of other parts of biceps, unspecified arm, initial encounter: Secondary | ICD-10-CM | POA: Diagnosis not present

## 2018-10-25 DIAGNOSIS — Z79899 Other long term (current) drug therapy: Secondary | ICD-10-CM | POA: Diagnosis not present

## 2018-11-05 DIAGNOSIS — Z780 Asymptomatic menopausal state: Secondary | ICD-10-CM | POA: Diagnosis not present

## 2018-11-05 DIAGNOSIS — E7800 Pure hypercholesterolemia, unspecified: Secondary | ICD-10-CM | POA: Diagnosis not present

## 2018-11-05 DIAGNOSIS — J029 Acute pharyngitis, unspecified: Secondary | ICD-10-CM | POA: Diagnosis not present

## 2018-11-05 DIAGNOSIS — Z79899 Other long term (current) drug therapy: Secondary | ICD-10-CM | POA: Diagnosis not present

## 2018-11-05 DIAGNOSIS — R358 Other polyuria: Secondary | ICD-10-CM | POA: Diagnosis not present

## 2018-11-05 DIAGNOSIS — J101 Influenza due to other identified influenza virus with other respiratory manifestations: Secondary | ICD-10-CM | POA: Diagnosis not present

## 2018-11-05 DIAGNOSIS — Z862 Personal history of diseases of the blood and blood-forming organs and certain disorders involving the immune mechanism: Secondary | ICD-10-CM | POA: Diagnosis not present

## 2018-11-05 DIAGNOSIS — R05 Cough: Secondary | ICD-10-CM | POA: Diagnosis not present

## 2018-11-11 ENCOUNTER — Other Ambulatory Visit: Payer: Self-pay | Admitting: Physician Assistant

## 2018-11-11 DIAGNOSIS — I10 Essential (primary) hypertension: Secondary | ICD-10-CM

## 2018-11-12 DIAGNOSIS — Z79899 Other long term (current) drug therapy: Secondary | ICD-10-CM | POA: Diagnosis not present

## 2018-11-12 DIAGNOSIS — M75102 Unspecified rotator cuff tear or rupture of left shoulder, not specified as traumatic: Secondary | ICD-10-CM | POA: Diagnosis not present

## 2018-11-12 DIAGNOSIS — M545 Low back pain: Secondary | ICD-10-CM | POA: Diagnosis not present

## 2018-11-12 DIAGNOSIS — S46219A Strain of muscle, fascia and tendon of other parts of biceps, unspecified arm, initial encounter: Secondary | ICD-10-CM | POA: Diagnosis not present

## 2018-11-12 DIAGNOSIS — M12812 Other specific arthropathies, not elsewhere classified, left shoulder: Secondary | ICD-10-CM | POA: Diagnosis not present

## 2018-12-05 ENCOUNTER — Other Ambulatory Visit: Payer: Self-pay | Admitting: Physician Assistant

## 2018-12-05 DIAGNOSIS — I10 Essential (primary) hypertension: Secondary | ICD-10-CM

## 2018-12-30 DIAGNOSIS — M545 Low back pain: Secondary | ICD-10-CM | POA: Diagnosis not present

## 2018-12-30 DIAGNOSIS — Z79899 Other long term (current) drug therapy: Secondary | ICD-10-CM | POA: Diagnosis not present

## 2018-12-30 DIAGNOSIS — M7710 Lateral epicondylitis, unspecified elbow: Secondary | ICD-10-CM | POA: Diagnosis not present

## 2018-12-30 DIAGNOSIS — S46219A Strain of muscle, fascia and tendon of other parts of biceps, unspecified arm, initial encounter: Secondary | ICD-10-CM | POA: Diagnosis not present

## 2018-12-30 DIAGNOSIS — G8929 Other chronic pain: Secondary | ICD-10-CM | POA: Diagnosis not present

## 2019-01-04 DIAGNOSIS — M7989 Other specified soft tissue disorders: Secondary | ICD-10-CM | POA: Diagnosis not present

## 2019-01-04 DIAGNOSIS — Z79899 Other long term (current) drug therapy: Secondary | ICD-10-CM | POA: Diagnosis not present

## 2019-01-04 DIAGNOSIS — M25471 Effusion, right ankle: Secondary | ICD-10-CM | POA: Diagnosis not present

## 2019-01-04 DIAGNOSIS — Z880 Allergy status to penicillin: Secondary | ICD-10-CM | POA: Diagnosis not present

## 2019-01-04 DIAGNOSIS — L03115 Cellulitis of right lower limb: Secondary | ICD-10-CM | POA: Diagnosis not present

## 2019-01-04 DIAGNOSIS — Z888 Allergy status to other drugs, medicaments and biological substances status: Secondary | ICD-10-CM | POA: Diagnosis not present

## 2019-01-04 DIAGNOSIS — R2243 Localized swelling, mass and lump, lower limb, bilateral: Secondary | ICD-10-CM | POA: Diagnosis not present

## 2019-01-04 DIAGNOSIS — Z20828 Contact with and (suspected) exposure to other viral communicable diseases: Secondary | ICD-10-CM | POA: Diagnosis not present

## 2019-01-04 DIAGNOSIS — M25472 Effusion, left ankle: Secondary | ICD-10-CM | POA: Diagnosis not present

## 2019-01-08 ENCOUNTER — Other Ambulatory Visit: Payer: Self-pay | Admitting: Physician Assistant

## 2019-01-08 DIAGNOSIS — F330 Major depressive disorder, recurrent, mild: Secondary | ICD-10-CM

## 2019-01-08 DIAGNOSIS — F419 Anxiety disorder, unspecified: Secondary | ICD-10-CM

## 2019-01-11 NOTE — Telephone Encounter (Signed)
Needs appt

## 2019-01-11 NOTE — Telephone Encounter (Signed)
Appointment has been made and she needed to be seen for a hospital follow up. No further questions at this time.

## 2019-01-12 ENCOUNTER — Ambulatory Visit (INDEPENDENT_AMBULATORY_CARE_PROVIDER_SITE_OTHER): Payer: BLUE CROSS/BLUE SHIELD | Admitting: Physician Assistant

## 2019-01-12 ENCOUNTER — Encounter: Payer: Self-pay | Admitting: Physician Assistant

## 2019-01-12 VITALS — BP 136/71 | HR 70 | Temp 98.7°F | Ht 65.5 in | Wt 176.0 lb

## 2019-01-12 DIAGNOSIS — I10 Essential (primary) hypertension: Secondary | ICD-10-CM

## 2019-01-12 DIAGNOSIS — F411 Generalized anxiety disorder: Secondary | ICD-10-CM

## 2019-01-12 DIAGNOSIS — F419 Anxiety disorder, unspecified: Secondary | ICD-10-CM | POA: Diagnosis not present

## 2019-01-12 DIAGNOSIS — F330 Major depressive disorder, recurrent, mild: Secondary | ICD-10-CM

## 2019-01-12 DIAGNOSIS — L03115 Cellulitis of right lower limb: Secondary | ICD-10-CM

## 2019-01-12 DIAGNOSIS — R600 Localized edema: Secondary | ICD-10-CM

## 2019-01-12 DIAGNOSIS — W19XXXA Unspecified fall, initial encounter: Secondary | ICD-10-CM

## 2019-01-12 MED ORDER — CLINDAMYCIN HCL 300 MG PO CAPS: 300.0000 mg | ORAL_CAPSULE | Freq: Three times a day (TID) | ORAL | 0 refills | Status: DC

## 2019-01-12 MED ORDER — BUSPIRONE HCL 7.5 MG PO TABS: 7.5000 mg | ORAL_TABLET | Freq: Three times a day (TID) | ORAL | 1 refills | Status: DC

## 2019-01-12 MED ORDER — HYDROCHLOROTHIAZIDE 25 MG PO TABS: ORAL_TABLET | ORAL | 1 refills | Status: DC

## 2019-01-12 MED ORDER — VENLAFAXINE HCL ER 150 MG PO CP24: ORAL_CAPSULE | ORAL | 3 refills | Status: DC

## 2019-01-12 MED ORDER — ALPRAZOLAM 1 MG PO TABS: ORAL_TABLET | ORAL | 1 refills | Status: DC

## 2019-01-12 MED ORDER — LISINOPRIL-HYDROCHLOROTHIAZIDE 20-12.5 MG PO TABS: 1.0000 | ORAL_TABLET | Freq: Every day | ORAL | 1 refills | Status: DC

## 2019-01-12 NOTE — Progress Notes (Signed)
Subjective:    Patient ID: Kimberly Wade, female    DOB: [birth year], 48 y.o.   MRN: [id number]  HPI  Pt is a 48 yo female with HTN, anxiety, GERD, HLD who presents to the clinic to follow up after ED visit on 5/19 for right lower leg cellulitis and bilateral leg edema. Work up for DVT was negative. She was started on clindamycin and lasix. She is on her last pill of clindamyicn and finished lasix on the 24th.  Her swelling is much better but still has some with residual pain and warmth in right leg. She has never had problems with swelling of legs. She did just get a new puppy and has been scratching her legs a lot. No fever, chills, body aches since ED visit. Her leg pain is greatly improved but still has problems when she stands with intense pain. At times it makes her feel unstable. She did fall 2 days ago. She needs her husband at home to care for her.   She does need some refills. No problems or concerns. Her mood is good. She uses xanax about 1-2 times a week.   .. Active Ambulatory Problems    Diagnosis Date Noted  . Insomnia 12/24/2012  . Depression 12/24/2012  . Migraine 12/24/2012  . Hyperlipidemia 12/24/2012  . Vitamin D deficiency 12/24/2012  . Essential hypertension, benign 12/24/2012  . Anxiety 12/24/2012  . S/P gastric bypass 12/24/2012  . Increased PTH level 12/24/2012  . GERD (gastroesophageal reflux disease) 12/24/2012  . Aspiration into lower respiratory tract 05/02/2013  . Primary osteoarthritis of right hand 02/02/2014  . Tension headache 08/08/2014  . Ovarian cyst, right 09/14/2015  . Metrorrhagia 09/18/2015  . Anemia 10/26/2015  . Right trigger finger 05/05/2016  . Low back pain without sciatica 08/08/2016  . Generalized anxiety disorder 08/09/2016  . 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 Overweight (BMI 25.0-29.9) 02/01/2017  . Acute shoulder bursitis, left 04/03/2017  . Biceps muscle tear,  left, subsequent encounter 06/08/2017  . Complete tear of left rotator cuff 06/08/2017  . S/P shoulder surgery 02/12/2018  . Chronic left shoulder pain 02/12/2018  . No energy 02/17/2018  . Biceps rupture, proximal, right, initial encounter 06/03/2018   Resolved Ambulatory Problems    Diagnosis Date Noted  . Lingular pneumonia 12/24/2012  . Type 2 diabetes mellitus ([place]) 12/24/2012  . Sinusitis, chronic 12/24/2012  . Diabetic retinopathy ([place]) 12/24/2012  . Right hand pain 05/05/2016  . Acute cystitis 06/18/2016  . Influenza-like illness 08/29/2016  . Acute non-recurrent maxillary sinusitis 10/06/2017   Past Medical History:  Diagnosis Date  . Arthritis   . Chronic pain   . Hypertension   . Pneumococcal pneumonia ([place]) 2000  . Pneumonia      Review of Systems See HPI.     Objective:   Physical Exam Vitals signs reviewed.  Constitutional:      Appearance: Normal appearance.  Cardiovascular:     Rate and Rhythm: Normal rate and regular rhythm.     Pulses: Normal pulses.  Pulmonary:     Breath sounds: Normal breath sounds.  Musculoskeletal:     Comments: Right knee medial bruise and tender to touch. Full rOM.     Skin:    Comments: Bilateral leg with scant pitting edema. Right leg more swollen and slightly warmer to touch. No erythema.   Appearance of scratches on right leg.   Neurological:     General: No  focal deficit present.     Mental Status: She is alert and oriented to person, place, and time.  Psychiatric:        Mood and Affect: Mood normal.        Behavior: Behavior normal.           Assessment & Plan:  Marland KitchenMarland KitchenPriyana was seen today for edema.  Diagnoses and all orders for this visit:  Cellulitis of right leg -     clindamycin (CLEOCIN) 300 MG capsule; Take 1 capsule (300 mg total) by mouth 3 (three) times daily. For 5 days.  Generalized anxiety disorder -     ALPRAZolam (XANAX) 1 MG tablet; Take one tablet as needed for anxiety no more than  once a day.  Anxiety -     ALPRAZolam (XANAX) 1 MG tablet; Take one tablet as needed for anxiety no more than once a day. -     busPIRone (BUSPAR) 7.5 MG tablet; Take 1 tablet (7.5 mg total) by mouth 3 (three) times daily.  Mild episode of recurrent major depressive disorder (HCC) -     venlafaxine XR (EFFEXOR-XR) 150 MG 24 hr capsule; Take one tablet.  Essential hypertension, benign -     lisinopril-hydrochlorothiazide (ZESTORETIC) 20-12.5 MG tablet; Take 1 tablet by mouth daily. -     hydrochlorothiazide (HYDRODIURIL) 25 MG tablet; TAKE 1 TABLET BY MOUTH DAILY AS NEEDED FOR LOWER EXTREMITY SWELLING  Bilateral leg edema -     hydrochlorothiazide (HYDRODIURIL) 25 MG tablet; TAKE 1 TABLET BY MOUTH DAILY AS NEEDED FOR LOWER EXTREMITY SWELLING   Very scant swelling left. Right leg just a hair warmer than left leg. Will treat with clindamycin for 5 more days. Added 25mg  of HCTZ to take for the next week with zestoretic and then go back to as needed usage. Encouraged feet elevation. Pt is on pain rx for chronic pain. Wrote note for husband to stay at home with her until she can ambulate without pain.   Refilled medications.   Follow up as needed or in 6 months.

## 2019-01-14 ENCOUNTER — Encounter: Payer: Self-pay | Admitting: Physician Assistant

## 2019-02-08 DIAGNOSIS — Z23 Encounter for immunization: Secondary | ICD-10-CM | POA: Diagnosis not present

## 2019-02-08 DIAGNOSIS — Z79899 Other long term (current) drug therapy: Secondary | ICD-10-CM | POA: Diagnosis not present

## 2019-02-08 DIAGNOSIS — G44319 Acute post-traumatic headache, not intractable: Secondary | ICD-10-CM | POA: Diagnosis not present

## 2019-02-08 DIAGNOSIS — Z880 Allergy status to penicillin: Secondary | ICD-10-CM | POA: Diagnosis not present

## 2019-02-08 DIAGNOSIS — W010XXA Fall on same level from slipping, tripping and stumbling without subsequent striking against object, initial encounter: Secondary | ICD-10-CM | POA: Diagnosis not present

## 2019-02-08 DIAGNOSIS — S0101XA Laceration without foreign body of scalp, initial encounter: Secondary | ICD-10-CM | POA: Diagnosis not present

## 2019-02-08 DIAGNOSIS — Z888 Allergy status to other drugs, medicaments and biological substances status: Secondary | ICD-10-CM | POA: Diagnosis not present

## 2019-02-17 DIAGNOSIS — M545 Low back pain: Secondary | ICD-10-CM | POA: Diagnosis not present

## 2019-02-17 DIAGNOSIS — G8929 Other chronic pain: Secondary | ICD-10-CM | POA: Diagnosis not present

## 2019-02-17 DIAGNOSIS — M75102 Unspecified rotator cuff tear or rupture of left shoulder, not specified as traumatic: Secondary | ICD-10-CM | POA: Diagnosis not present

## 2019-02-17 DIAGNOSIS — Z79899 Other long term (current) drug therapy: Secondary | ICD-10-CM | POA: Diagnosis not present

## 2019-02-17 DIAGNOSIS — M12812 Other specific arthropathies, not elsewhere classified, left shoulder: Secondary | ICD-10-CM | POA: Diagnosis not present

## 2019-03-25 ENCOUNTER — Ambulatory Visit (INDEPENDENT_AMBULATORY_CARE_PROVIDER_SITE_OTHER): Payer: BC Managed Care – PPO | Admitting: Physician Assistant

## 2019-03-25 ENCOUNTER — Encounter: Payer: Self-pay | Admitting: Physician Assistant

## 2019-03-25 ENCOUNTER — Other Ambulatory Visit: Payer: Self-pay

## 2019-03-25 VITALS — BP 125/68 | HR 90 | Temp 99.1°F | Ht 65.5 in | Wt 170.0 lb

## 2019-03-25 DIAGNOSIS — F0781 Postconcussional syndrome: Secondary | ICD-10-CM | POA: Diagnosis not present

## 2019-03-25 DIAGNOSIS — I7300 Raynaud's syndrome without gangrene: Secondary | ICD-10-CM

## 2019-03-25 DIAGNOSIS — E876 Hypokalemia: Secondary | ICD-10-CM

## 2019-03-25 DIAGNOSIS — R059 Cough, unspecified: Secondary | ICD-10-CM

## 2019-03-25 DIAGNOSIS — F330 Major depressive disorder, recurrent, mild: Secondary | ICD-10-CM | POA: Diagnosis not present

## 2019-03-25 DIAGNOSIS — R05 Cough: Secondary | ICD-10-CM

## 2019-03-25 DIAGNOSIS — R631 Polydipsia: Secondary | ICD-10-CM | POA: Diagnosis not present

## 2019-03-25 DIAGNOSIS — F411 Generalized anxiety disorder: Secondary | ICD-10-CM

## 2019-03-25 MED ORDER — HYDROCOD POLST-CPM POLST ER 10-8 MG/5ML PO SUER: 5.0000 mL | Freq: Two times a day (BID) | ORAL | 0 refills | Status: DC | PRN

## 2019-03-25 MED ORDER — VENLAFAXINE HCL ER 75 MG PO CP24: 75.0000 mg | ORAL_CAPSULE | Freq: Every day | ORAL | 1 refills | Status: DC

## 2019-03-25 NOTE — Patient Instructions (Addendum)
Post-Concussion Syndrome  Post-concussion syndrome is when symptoms last longer than normal after a head injury. What are the signs or symptoms? After a head injury, you may:  Have headaches.  Feel tired.  Feel dizzy.  Feel weak.  Have trouble seeing.  Have trouble in bright lights.  Have trouble hearing.  Not be able to remember things.  Not be able to focus.  Have trouble sleeping.  Have mood swings.  Have trouble learning new things. These can last from weeks to months. Follow these instructions at home: Medicines  Take all medicines only as told by your doctor.  Do not take prescription pain medicines. Activity  Limit activities as told by your doctor. This includes: ? Homework. ? Job-related work. ? Thinking. ? Watching TV. ? Using a computer or phone. ? Puzzles. ? Exercise. ? Sports.  Slowly return to your normal activity as told by your doctor.  Stop an activity if you have symptoms.  Do not do anything that may cause you to get injured again. General instructions  Rest. Try to: ? Sleep 7-9 hours each night. ? Take naps or breaks when you feel tired during the day.  Do not drink alcohol until your doctor says that you can.  Keep track of your symptoms.  Keep all follow-up visits as told by your doctor. This is important. Contact a doctor if:  You do not improve.  You get worse.  You have another injury. Get help right away if:  You have a very bad headache.  You feel confused.  You feel very sleepy.  You pass out (faint).  You throw up (vomit).  You feel weak in any part of your body.  You feel numb in any part of your body.  You start shaking (have a seizure).  You have trouble talking. Summary  Post-concussion syndrome is when symptoms last longer than normal after a head injury.  Limit all activity after your injury. Gradually return to normal activity as told by your doctor.  Rest, do not drink alcohol, and  avoid prescription pain medicines after a concussion.  Call your doctor if your symptoms get worse. This information is not intended to replace advice given to you by your health care provider. Make sure you discuss any questions you have with your health care provider. Document Released: 09/11/2004 Document Revised: 05/27/2018 Document Reviewed: 09/08/2017 Elsevier Patient Education  2020 [place] phenomenon is a condition that affects the blood vessels (arteries) that carry blood to your fingers and toes. The arteries that supply blood to your ears, lips, nipples, or the tip of your nose might also be affected. Raynaud phenomenon causes the arteries to become narrow temporarily (spasm). As a result, the flow of blood to the affected areas is temporarily decreased. This usually occurs in response to cold temperatures or stress. During an attack, the skin in the affected areas turns white, then blue, and finally red. You may also feel tingling or numbness in those areas. Attacks usually last for only a brief period, and then the blood flow to the area returns to normal. In most cases, Raynaud phenomenon does not cause serious health problems. What are the causes? In many cases, the cause of this condition is not known. The condition may occur on its own (primary Raynaud phenomenon) or may be associated with other diseases or factors (secondary Raynaud phenomenon). Possible causes may include:  Diseases or medical conditions that damage the arteries.  Injuries and repetitive actions that hurt the hands or feet.  Being exposed to certain chemicals.  Taking medicines that narrow the arteries.  Other medical conditions, such as lupus, scleroderma, rheumatoid arthritis, thyroid problems, blood disorders, Sjogren syndrome, or atherosclerosis. What increases the risk? The following factors may make you more likely to develop this condition:  Being 50-76  years old.  Being female.  Having a family history of Raynaud phenomenon.  Living in a cold climate.  Smoking. What are the signs or symptoms? Symptoms of this condition usually occur when you are exposed to cold temperatures or when you have emotional stress. The symptoms may last for a few minutes or up to several hours. They usually affect your fingers but may also affect your toes, nipples, lips, ears, or the tip of your nose. Symptoms may include:  Changes in skin color. The skin in the affected areas will turn pale or white. The skin may then change from white to bluish to red as normal blood flow returns to the area.  Numbness, tingling, or pain in the affected areas. In severe cases, symptoms may include:  Skin sores.  Tissues decaying and dying (gangrene). How is this diagnosed? This condition may be diagnosed based on:  Your symptoms and medical history.  A physical exam. During the exam, you may be asked to put your hands in cold water to check for a reaction to cold temperature.  Tests, such as: ? Blood tests to check for other diseases or conditions. ? A test to check the movement of blood through your arteries and veins (vascular ultrasound). ? A test in which the skin at the base of your fingernail is examined under a microscope (nailfold capillaroscopy). How is this treated? Treatment for this condition often involves making lifestyle changes and taking steps to control your exposure to cold temperatures. For more severe cases, medicine (calcium channel blockers) may be used to improve blood flow. Surgery is sometimes done to block the nerves that control the affected arteries, but this is rare. Follow these instructions at home: Avoiding cold temperatures Take these steps to avoid exposure to cold:  If possible, stay indoors during cold weather.  When you go outside during cold weather, dress in layers and wear mittens, a hat, a scarf, and warm footwear.   Wear mittens or gloves when handling ice or frozen food.  Use holders for glasses or cans containing cold drinks.  Let warm water run for a while before taking a shower or bath.  Warm up the car before driving in cold weather. Lifestyle   If possible, avoid stressful and emotional situations. Try to find ways to manage your stress, such as: ? Exercise. ? Yoga. ? Meditation. ? Biofeedback.  Do not use any products that contain nicotine or tobacco, such as cigarettes and e-cigarettes. If you need help quitting, ask your health care provider.  Avoid secondhand smoke.  Limit your use of caffeine. ? Switch to decaffeinated coffee, tea, and soda. ? Avoid chocolate.  Avoid vibrating tools and machinery. General instructions  Protect your hands and feet from injuries, cuts, or bruises.  Avoid wearing tight rings or wristbands.  Wear loose fitting socks and comfortable, roomy shoes.  Take over-the-counter and prescription medicines only as told by your health care provider. Contact a health care provider if:  Your discomfort becomes worse despite lifestyle changes.  You develop sores on your fingers or toes that do not heal.  Your fingers or toes turn black.  You have breaks in the skin on your fingers or toes.  You have a fever.  You have pain or swelling in your joints.  You have a rash.  Your symptoms occur on only one side of your body. Summary  Raynaud phenomenon is a condition that affects the arteries that carry blood to your fingers, toes, ears, lips, nipples, or the tip of your nose.  In many cases, the cause of this condition is not known.  Symptoms of this condition include changes in skin color, and numbness and tingling of the affected area.  Treatment for this condition includes lifestyle changes, reducing exposure to cold temperatures, and using medicines for severe cases of the condition.  Contact your health care provider if your condition worsens  despite treatment. This information is not intended to replace advice given to you by your health care provider. Make sure you discuss any questions you have with your health care provider. Document Released: 08/01/2000 Document Revised: 08/07/2017 Document Reviewed: 09/15/2016 Elsevier Patient Education  2020 [employer].

## 2019-03-25 NOTE — Progress Notes (Signed)
Subjective:    Patient ID: Kimberly Wade, female    DOB: Jul 24, 1971, 48 y.o.   MRN: [id number]  HPI  Pt is a 48 yo female who presents to the clinic with some concerns. Her main concern is depression. She has felt really unmotivated and down since her head injury on 6/23 when she tripped over her dog in the kitchen and hit her right forehead on the [employer]. She has progressively felt less motivated and has no desire to get out of bed. She feels very unfocused and memory is worse. She feels like her eyes are sensitive and lights bother then.   She mentions a residual cough worse at night that she uses rx cough syrup for as needed. Request today.   Mentions fingers and toes becoming cold and blue at times. Mother has raynauds syndrome. Symptoms resolve spontaneously.  .. Active Ambulatory Problems    Diagnosis Date Noted  . Insomnia 12/24/2012  . Depression 12/24/2012  . Migraine 12/24/2012  . Hyperlipidemia 12/24/2012  . Vitamin D deficiency 12/24/2012  . Essential hypertension, benign 12/24/2012  . Anxiety 12/24/2012  . S/P gastric bypass 12/24/2012  . Increased PTH level 12/24/2012  . GERD (gastroesophageal reflux disease) 12/24/2012  . Aspiration into lower respiratory tract 05/02/2013  . Primary osteoarthritis of right hand 02/02/2014  . Tension headache 08/08/2014  . Ovarian cyst, right 09/14/2015  . Metrorrhagia 09/18/2015  . Anemia 10/26/2015  . Right trigger finger 05/05/2016  . Low back pain without sciatica 08/08/2016  . Generalized anxiety disorder 08/09/2016  . 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 Overweight (BMI 25.0-29.9) 02/01/2017  . Acute shoulder bursitis, left 04/03/2017  . Biceps muscle tear, left, subsequent encounter 06/08/2017  . Complete tear of left rotator cuff 06/08/2017  . S/P shoulder surgery 02/12/2018  . Chronic left shoulder pain 02/12/2018  . No energy 02/17/2018  . Biceps  rupture, proximal, right, initial encounter 06/03/2018  . Raynaud's disease without gangrene 03/29/2019  . Post concussion syndrome 03/29/2019   Resolved Ambulatory Problems    Diagnosis Date Noted  . Lingular pneumonia 12/24/2012  . Type 2 diabetes mellitus ([place]) 12/24/2012  . Sinusitis, chronic 12/24/2012  . Diabetic retinopathy ([place]) 12/24/2012  . Right hand pain 05/05/2016  . Acute cystitis 06/18/2016  . Influenza-like illness 08/29/2016  . Acute non-recurrent maxillary sinusitis 10/06/2017   Past Medical History:  Diagnosis Date  . Arthritis   . Chronic pain   . Hypertension   . Pneumococcal pneumonia ([place]) 2000  . Pneumonia      Review of Systems See HPI.     Objective:   Physical Exam Vitals signs reviewed.  Constitutional:      Appearance: Normal appearance.  HENT:     Head: Normocephalic.     Right Ear: Tympanic membrane normal.     Left Ear: Tympanic membrane normal.  Cardiovascular:     Rate and Rhythm: Normal rate and regular rhythm.  Pulmonary:     Effort: Pulmonary effort is normal.     Breath sounds: Normal breath sounds.  Neurological:     General: No focal deficit present.     Mental Status: She is alert and oriented to person, place, and time.  Psychiatric:     Comments: A little flat affect.            Assessment & Plan:  Marland KitchenMarland KitchenLanora was seen today for gi problem.  Diagnoses and all orders for this visit:  Mild episode of recurrent major depressive disorder (HCC) -     venlafaxine XR (EFFEXOR XR) 75 MG 24 hr capsule; Take 1 capsule (75 mg total) by mouth daily with breakfast.  Hypokalemia -     COMPLETE METABOLIC PANEL WITH GFR  Excessive thirst -     Hemoglobin A1c  Post concussion syndrome  Generalized anxiety disorder -     venlafaxine XR (EFFEXOR XR) 75 MG 24 hr capsule; Take 1 capsule (75 mg total) by mouth daily with breakfast.  Cough -     chlorpheniramine-HYDROcodone (TUSSIONEX) 10-8 MG/5ML SUER; Take 5 mLs by mouth  every 12 (twelve) hours as needed.  Raynaud's disease without gangrene   .[name] Depression screen [facility] 2/9 03/25/2019 12/10/2017 06/08/2017 12/05/2016  Decreased Interest 2 1 0 1  Down, Depressed, Hopeless 1 1 0 0  PHQ - 2 Score 3 2 0 1  Altered sleeping 3 1 1 1   Tired, decreased energy 3 2 1 1   Change in appetite 2 0 1 1  Feeling bad or failure about yourself  0 0 1 0  Trouble concentrating 3 1 0 1  Moving slowly or fidgety/restless 2 0 0 0  Suicidal thoughts 0 0 0 0  PHQ-9 Score 16 6 4 5   Difficult doing work/chores Extremely dIfficult Not difficult at all Not difficult at all -   .. GAD 7 : Generalized Anxiety Score 03/25/2019 12/10/2017 06/08/2017 12/05/2016  Nervous, Anxious, on Edge 2 1 2 1   Control/stop worrying 2 1 1 1   Worry too much - different things 2 1 1 1   Trouble relaxing 3 1 1 1   Restless 2 0 1 1  Easily annoyed or irritable 2 1 1 1   Afraid - awful might happen 0 0 0 0  Total GAD 7 Score 13 5 7 6   Anxiety Difficulty Extremely difficult Not difficult at all - -    I suspect post concussion syndrome that is worsening depression symptoms. Added 75mg  to 150mg  of effexor. Continue buspar. Discussed brain rest. Continue to take things slow and give time after head injury to bounce back. Follow up in 6 weeks.   Will check a1c/potassium. Hx of low potassium. Hx of diabetes before her gastric bypass.   rx given for tussinonex. Discussed to use as needed. If cough is daily or persistent needs follow up.  Last oxycodone 07/2018 .Marland KitchenPDMP reviewed during this encounter.   Discussed raynauds and given HO. No treatment needed at this time.

## 2019-03-26 LAB — COMPLETE METABOLIC PANEL WITH GFR
AG Ratio: 1.6 (calc) (ref 1.0–2.5)
ALT: 40 U/L — ABNORMAL HIGH (ref 6–29)
AST: 35 U/L (ref 10–35)
Albumin: 3.8 g/dL (ref 3.6–5.1)
Alkaline phosphatase (APISO): 68 U/L (ref 31–125)
BUN: 11 mg/dL (ref 7–25)
CO2: 27 mmol/L (ref 20–32)
Calcium: 8.7 mg/dL (ref 8.6–10.2)
Chloride: 106 mmol/L (ref 98–110)
Creat: 0.5 mg/dL (ref 0.50–1.10)
GFR, Est African American: 133 mL/min/{1.73_m2} (ref >=60)
GFR, Est Non African American: 114 mL/min/{1.73_m2} (ref >=60)
Globulin: 2.4 g/dL (calc) (ref 1.9–3.7)
Glucose, Bld: 100 mg/dL — ABNORMAL HIGH (ref 65–99)
Potassium: 3.9 mmol/L (ref 3.5–5.3)
Sodium: 143 mmol/L (ref 135–146)
Total Bilirubin: 0.3 mg/dL (ref 0.2–1.2)
Total Protein: 6.2 g/dL (ref 6.1–8.1)

## 2019-03-26 LAB — HEMOGLOBIN A1C
Hgb A1c MFr Bld: 5.2 % of total Hgb (ref <5.7)
Mean Plasma Glucose: 103 (calc)
eAG (mmol/L): 5.7 (calc)

## 2019-03-28 NOTE — Progress Notes (Signed)
Call pt: one liver enzyme elevated. Will watch. Watch daily tylenol consumption/alcohol use. Recheck in 4 weeks.  a1c is up a little from a year ago but not in concerning range.

## 2019-03-29 ENCOUNTER — Encounter: Payer: Self-pay | Admitting: Physician Assistant

## 2019-03-29 DIAGNOSIS — F0781 Postconcussional syndrome: Secondary | ICD-10-CM | POA: Insufficient documentation

## 2019-03-29 DIAGNOSIS — I7300 Raynaud's syndrome without gangrene: Secondary | ICD-10-CM | POA: Insufficient documentation

## 2019-04-26 ENCOUNTER — Other Ambulatory Visit: Payer: Self-pay | Admitting: Physician Assistant

## 2019-04-26 DIAGNOSIS — F419 Anxiety disorder, unspecified: Secondary | ICD-10-CM

## 2019-04-26 DIAGNOSIS — F411 Generalized anxiety disorder: Secondary | ICD-10-CM

## 2019-04-26 NOTE — Telephone Encounter (Signed)
Last filled 01/12/2019 #45 with one refill.  Last seen 03/25/2019.

## 2019-04-27 ENCOUNTER — Other Ambulatory Visit: Payer: Self-pay | Admitting: Neurology

## 2019-04-27 MED ORDER — VITAMIN D (ERGOCALCIFEROL) 1.25 MG (50000 UNIT) PO CAPS: ORAL_CAPSULE | ORAL | 3 refills | Status: DC

## 2019-05-19 ENCOUNTER — Other Ambulatory Visit: Payer: Self-pay | Admitting: Physician Assistant

## 2019-05-19 DIAGNOSIS — F411 Generalized anxiety disorder: Secondary | ICD-10-CM

## 2019-05-19 DIAGNOSIS — F330 Major depressive disorder, recurrent, mild: Secondary | ICD-10-CM

## 2019-06-19 ENCOUNTER — Other Ambulatory Visit: Payer: Self-pay | Admitting: Physician Assistant

## 2019-06-19 DIAGNOSIS — F411 Generalized anxiety disorder: Secondary | ICD-10-CM

## 2019-06-19 DIAGNOSIS — F330 Major depressive disorder, recurrent, mild: Secondary | ICD-10-CM

## 2019-06-20 ENCOUNTER — Ambulatory Visit (INDEPENDENT_AMBULATORY_CARE_PROVIDER_SITE_OTHER): Payer: BC Managed Care – PPO | Admitting: Physician Assistant

## 2019-06-20 ENCOUNTER — Other Ambulatory Visit: Payer: Self-pay

## 2019-06-20 ENCOUNTER — Encounter: Payer: Self-pay | Admitting: Physician Assistant

## 2019-06-20 VITALS — BP 142/85 | HR 76 | Temp 98.5°F | Ht 65.0 in | Wt 167.0 lb

## 2019-06-20 DIAGNOSIS — Z20828 Contact with and (suspected) exposure to other viral communicable diseases: Secondary | ICD-10-CM

## 2019-06-20 DIAGNOSIS — R52 Pain, unspecified: Secondary | ICD-10-CM

## 2019-06-20 DIAGNOSIS — R059 Cough, unspecified: Secondary | ICD-10-CM

## 2019-06-20 DIAGNOSIS — J3489 Other specified disorders of nose and nasal sinuses: Secondary | ICD-10-CM

## 2019-06-20 DIAGNOSIS — R519 Headache, unspecified: Secondary | ICD-10-CM | POA: Diagnosis not present

## 2019-06-20 DIAGNOSIS — Z20822 Contact with and (suspected) exposure to covid-19: Secondary | ICD-10-CM

## 2019-06-20 DIAGNOSIS — R05 Cough: Secondary | ICD-10-CM

## 2019-06-20 MED ORDER — RIZATRIPTAN BENZOATE 10 MG PO TABS: 10.0000 mg | ORAL_TABLET | ORAL | 0 refills | Status: DC | PRN

## 2019-06-20 MED ORDER — ONDANSETRON 8 MG PO TBDP: 8.0000 mg | ORAL_TABLET | Freq: Three times a day (TID) | ORAL | 0 refills | Status: DC | PRN

## 2019-06-20 MED ORDER — DEXAMETHASONE 4 MG PO TABS: ORAL_TABLET | ORAL | 0 refills | Status: DC

## 2019-06-20 MED ORDER — HYDROCOD POLST-CPM POLST ER 10-8 MG/5ML PO SUER: 5.0000 mL | Freq: Two times a day (BID) | ORAL | 0 refills | Status: DC | PRN

## 2019-06-20 NOTE — Progress Notes (Signed)
Subjective:    Patient ID: Kimberly Wade, female    DOB: [birth year], 48 y.o.   MRN: [id number]  HPI  Pt is a 48 yo female with 2 weeks of body aches, headache, cough, chills, ear popping and pressure. She denies any fever. No other sick contacts and no direct contact with covid. Pt has hx of migraines but this one will not go away. She denies any loss of smell or taste. She has no energy and no appetite. Her cough is productive and keeps her up at night. She is taking OTC cold medication with no benefit. She is extremely fatigue and has no motivation to do anything. She has ongoing Depression. She denies any SI/HC. No significant SOB.   [name]. Active Ambulatory Problems    Diagnosis Date Noted  . Insomnia 12/24/2012  . Depression 12/24/2012  . Migraine 12/24/2012  . Hyperlipidemia 12/24/2012  . Vitamin D deficiency 12/24/2012  . Essential hypertension, benign 12/24/2012  . Anxiety 12/24/2012  . S/P gastric bypass 12/24/2012  . Increased PTH level 12/24/2012  . GERD (gastroesophageal reflux disease) 12/24/2012  . Aspiration into lower respiratory tract 05/02/2013  . Primary osteoarthritis of right hand 02/02/2014  . Tension headache 08/08/2014  . Ovarian cyst, right 09/14/2015  . Metrorrhagia 09/18/2015  . Anemia 10/26/2015  . Right trigger finger 05/05/2016  . Low back pain without sciatica 08/08/2016  . Generalized anxiety disorder 08/09/2016  . 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 Overweight (BMI 25.0-29.9) 02/01/2017  . Acute shoulder bursitis, left 04/03/2017  . Biceps muscle tear, left, subsequent encounter 06/08/2017  . Complete tear of left rotator cuff 06/08/2017  . S/P shoulder surgery 02/12/2018  . Chronic left shoulder pain 02/12/2018  . No energy 02/17/2018  . Biceps rupture, proximal, right, initial encounter 06/03/2018  . Raynaud's disease without gangrene 03/29/2019  . Post concussion syndrome  03/29/2019   Resolved Ambulatory Problems    Diagnosis Date Noted  . Lingular pneumonia 12/24/2012  . Type 2 diabetes mellitus ([place]) 12/24/2012  . Sinusitis, chronic 12/24/2012  . Diabetic retinopathy ([place]) 12/24/2012  . Right hand pain 05/05/2016  . Acute cystitis 06/18/2016  . Influenza-like illness 08/29/2016  . Acute non-recurrent maxillary sinusitis 10/06/2017   Past Medical History:  Diagnosis Date  . Arthritis   . Chronic pain   . Hypertension   . Pneumococcal pneumonia ([place]) 2000  . Pneumonia       Review of Systems See HPI.     Objective:   Physical Exam Vitals signs reviewed.  Constitutional:      Appearance: She is well-developed.  HENT:     Head: Normocephalic.     Mouth/Throat:     Pharynx: Oropharynx is clear.  Eyes:     Extraocular Movements: Extraocular movements intact.     Pupils: Pupils are equal, round, and reactive to light.  Neck:     Musculoskeletal: Normal range of motion.  Cardiovascular:     Rate and Rhythm: Normal rate and regular rhythm.  Pulmonary:     Effort: Pulmonary effort is normal.  Lymphadenopathy:     Cervical: No cervical adenopathy.  Neurological:     Mental Status: She is alert and oriented to person, place, and time.  Psychiatric:     Comments: Flat affect.     TM's clear bilaterally.        Assessment & Plan:  Marland KitchenMarland KitchenSimra was seen today for headache.  Diagnoses and all orders for  this visit:  Acute intractable headache, unspecified headache type -     rizatriptan (MAXALT) 10 MG tablet; Take 1 tablet (10 mg total) by mouth as needed for migraine. May repeat in 2 hours if needed -     ondansetron (ZOFRAN ODT) 8 MG disintegrating tablet; Take 1 tablet (8 mg total) by mouth every 8 (eight) hours as needed for nausea or vomiting. -     dexamethasone (DECADRON) 4 MG tablet; Take 3 tablets for migraine now.  Generalized body aches -     Novel Coronavirus, NAA ([facility])  Cough -     chlorpheniramine-HYDROcodone  (TUSSIONEX) 10-8 MG/5ML SUER; Take 5 mLs by mouth every 12 (twelve) hours as needed. -     Novel Coronavirus, NAA ([facility])  Encounter for laboratory testing for COVID-19 virus -     Novel Coronavirus, NAA ([facility])  Pt has covid symptoms. Mask and eyewear worn at all times.  Once we identified she had COVID symptoms PPE was worn while talking and during self administered swab. Pt aware to self isolate until results are back.  Pt was escorted out back entrance as to have no more contact with other patients.  Room was cleaned according to protocol and left empty for 60 minutes.   maxalt refilled. zofran for nausea. Decadron for migraine rescue today.  tussinonex for cough. flonase for ear popping/pressure. Reassured no signs of bacterial infection. Discussed other symptomatic care.   Pt concerned depression is causing this. Seems like depression worsened after feeling sick. Will continue to monitor closely. Can communicate through [place] and virtual visits until test results are back.

## 2019-06-20 NOTE — Patient Instructions (Signed)
Will call with covid results.   Decadron and maxalt for migraine.   tussionex for cough.   Use flonase daily for ear pain and sinus pressure.

## 2019-06-21 ENCOUNTER — Encounter: Payer: Self-pay | Admitting: Physician Assistant

## 2019-06-22 LAB — SPECIMEN STATUS REPORT

## 2019-06-22 LAB — NOVEL CORONAVIRUS, NAA: SARS-CoV-2, NAA: NOT DETECTED

## 2019-06-22 MED ORDER — AZITHROMYCIN 250 MG PO TABS: ORAL_TABLET | ORAL | 0 refills | Status: DC

## 2019-06-22 NOTE — Progress Notes (Signed)
Sent zpak for potential sinus infection.

## 2019-06-22 NOTE — Addendum Note (Signed)
Addended by: Donella Stade on: 06/22/2019 09:45 AM   Modules accepted: Orders

## 2019-06-22 NOTE — Progress Notes (Signed)
GREAT news. Negative for Covid. How is your headache and sinus symptoms?

## 2019-06-30 ENCOUNTER — Telehealth: Payer: Self-pay | Admitting: Physician Assistant

## 2019-06-30 ENCOUNTER — Other Ambulatory Visit: Payer: Self-pay | Admitting: Physician Assistant

## 2019-06-30 DIAGNOSIS — R519 Headache, unspecified: Secondary | ICD-10-CM

## 2019-06-30 MED ORDER — ONDANSETRON 8 MG PO TBDP: 8.0000 mg | ORAL_TABLET | Freq: Three times a day (TID) | ORAL | 0 refills | Status: DC | PRN

## 2019-06-30 NOTE — Telephone Encounter (Signed)
Patient is still having some cough and her migraines are getting worse. Is there any way you can fill the Maxalt and cough syrup again. Patient reports she is desperate and needs some relief. Please advise.

## 2019-07-01 MED ORDER — RIZATRIPTAN BENZOATE 10 MG PO TABS: 10.0000 mg | ORAL_TABLET | ORAL | 0 refills | Status: DC | PRN

## 2019-07-01 NOTE — Telephone Encounter (Signed)
Sent to pharmacy 

## 2019-07-01 NOTE — Telephone Encounter (Signed)
Insurance will only pay for 10 maxalt a month.  Did decadron relieve headache any? Does it ever go away? Did maxalt relieve it?

## 2019-07-01 NOTE — Telephone Encounter (Signed)
The Maxalt was helping some and her headache has been on an off since she seen you at the last visit. She reports normally its not this frequent. She is aware that she may have to pay out of pocket for her Maxalt and the pharmacy will have a goodrx card for her to get a discount. She is wanting a few more to get her over the hump and feel better. The Decadron did help a little but she is wanting the Maxalt. Please advise .

## 2019-07-01 NOTE — Telephone Encounter (Signed)
Noted  

## 2019-07-18 ENCOUNTER — Other Ambulatory Visit: Payer: Self-pay | Admitting: Neurology

## 2019-07-18 DIAGNOSIS — R059 Cough, unspecified: Secondary | ICD-10-CM

## 2019-07-18 DIAGNOSIS — R05 Cough: Secondary | ICD-10-CM

## 2019-07-18 MED ORDER — PREDNISONE 50 MG PO TABS: ORAL_TABLET | ORAL | 0 refills | Status: DC

## 2019-07-18 NOTE — Telephone Encounter (Signed)
Patient made aware.

## 2019-07-18 NOTE — Telephone Encounter (Signed)
Patient left vm stating she is still having issues with cough. She is having drainage down back of throat at night and trouble with sleep. She is using nasal spray, but out of cough medication. She is asking for a refill to be sent to [facility]. Please advise.

## 2019-07-18 NOTE — Telephone Encounter (Signed)
Sounds like her post viral cough. We can not send narcotics multiple times. We can do a round of prednisone and continue with mucinex.

## 2019-07-27 ENCOUNTER — Other Ambulatory Visit: Payer: Self-pay | Admitting: Physician Assistant

## 2019-07-27 DIAGNOSIS — I10 Essential (primary) hypertension: Secondary | ICD-10-CM

## 2019-07-27 DIAGNOSIS — F330 Major depressive disorder, recurrent, mild: Secondary | ICD-10-CM

## 2019-07-27 DIAGNOSIS — F411 Generalized anxiety disorder: Secondary | ICD-10-CM

## 2019-10-15 ENCOUNTER — Other Ambulatory Visit: Payer: Self-pay | Admitting: Osteopathic Medicine

## 2020-01-12 ENCOUNTER — Other Ambulatory Visit: Payer: Self-pay

## 2020-01-12 ENCOUNTER — Encounter: Payer: Self-pay | Admitting: Emergency Medicine

## 2020-01-12 ENCOUNTER — Emergency Department (INDEPENDENT_AMBULATORY_CARE_PROVIDER_SITE_OTHER)
Admission: EM | Admit: 2020-01-12 | Discharge: 2020-01-12 | Disposition: A | Discharge status: Home / Self Care | Payer: BC Managed Care – PPO | Source: Home / Self Care | Attending: Family Medicine | Admitting: Family Medicine

## 2020-01-12 DIAGNOSIS — J069 Acute upper respiratory infection, unspecified: Secondary | ICD-10-CM | POA: Diagnosis not present

## 2020-01-12 DIAGNOSIS — M940 Chondrocostal junction syndrome [Tietze]: Secondary | ICD-10-CM

## 2020-01-12 MED ORDER — HYDROCODONE-HOMATROPINE 5-1.5 MG/5ML PO SYRP: ORAL_SOLUTION | ORAL | 0 refills | Status: DC

## 2020-01-12 MED ORDER — PREDNISONE 20 MG PO TABS: ORAL_TABLET | ORAL | 0 refills | Status: DC

## 2020-01-12 NOTE — ED Triage Notes (Signed)
Woke this am w/ a hoarse voice  & dry cough No OTC meds Pt has seasonal allergies  Pt went tubing this past Sunday & was outside yesterday playing  Pt also has concerns about fullness in ears & feels SOB (O2 sats 100% in traige) No COVID exposure or vaccine

## 2020-01-12 NOTE — ED Provider Notes (Signed)
[name]    CSN: [id number] Arrival date & time: 01/12/20  0840      History   Chief Complaint Chief Complaint  Patient presents with  . Cough  . Hoarse  . Otalgia    bilat    HPI Kimberly Wade is a 49 y.o. female.   Patient began developing typical cold symptoms yesterday including mild sore throat, sinus congestion, headache, fatigue, myalgias, and cough.  She has had chills/sweats, and complains of tightness in her anterior chest.  She has a history of mild reactive airways disease and seasonal allergic rhinitis.   She denies changes in taste/smell.    The history is provided by the patient.    Past Medical History:  Diagnosis Date  . Anemia   . Anxiety   . Arthritis   . Chronic pain    has pain contract for Norco 5/325mg   . GERD (gastroesophageal reflux disease)   . Hyperlipidemia   . Hypertension   . Insomnia   . Pneumococcal pneumonia ([place]) 2000  . Pneumonia     Patient Active Problem List   Diagnosis Date Noted  . Raynaud's disease without gangrene 03/29/2019  . Post concussion syndrome 03/29/2019  . Biceps rupture, proximal, right, initial encounter 06/03/2018  . No energy 02/17/2018  . S/P shoulder surgery 02/12/2018  . Chronic left shoulder pain 02/12/2018  . Biceps muscle tear, left, subsequent encounter 06/08/2017  . Complete tear of left rotator cuff 06/08/2017  . Acute shoulder bursitis, left 04/03/2017  . Overweight (BMI 25.0-29.9) 02/01/2017  . 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 Generalized anxiety disorder 08/09/2016  . Low back pain without sciatica 08/08/2016  . Right trigger finger 05/05/2016  . Anemia 10/26/2015  . Metrorrhagia 09/18/2015  . Ovarian cyst, right 09/14/2015  . Tension headache 08/08/2014  . Primary osteoarthritis of right hand 02/02/2014  . Aspiration into lower respiratory tract 05/02/2013  . Insomnia 12/24/2012  . Depression  12/24/2012  . Migraine 12/24/2012  . Hyperlipidemia 12/24/2012  . Vitamin D deficiency 12/24/2012  . Essential hypertension, benign 12/24/2012  . Anxiety 12/24/2012  . S/P gastric bypass 12/24/2012  . Increased PTH level 12/24/2012  . GERD (gastroesophageal reflux disease) 12/24/2012    Past Surgical History:  Procedure Laterality Date  . CESAREAN SECTION     x4  . CHEST TUBE INSERTION    . chest tubes  2000  . dysplastic mole removed     right ankle  . GASTRIC BYPASS    . INTRAUTERINE DEVICE (IUD) INSERTION N/A 07/11/2014   Procedure: INTRAUTERINE DEVICE (IUD) INSERTION;  Surgeon: Emily Filbert, MD;  Location: [facility];  Service: Gynecology;  Laterality: N/A;  . IUD REMOVAL N/A 07/11/2014   Procedure: INTRAUTERINE DEVICE (IUD) REMOVAL;  Surgeon: Emily Filbert, MD;  Location: [facility];  Service: Gynecology;  Laterality: N/A;  . mini gastric bypass  2011  . recoonstruction of right lung    . SHOULDER ARTHROSCOPY WITH SUBACROMIAL DECOMPRESSION, ROTATOR CUFF REPAIR AND BICEP TENDON REPAIR Left 05/20/2017   Procedure: LEFT SHOULDER ARTHROSCOPY WITH DEBRIDEMENT, ACROMIOPLASTY, DISTAL CLAVICAL EXCISION, ROTATOR CUFF REPAIR, OPEN BICEP TENODESIS;  Surgeon: Hiram Gash, MD;  Location: [facility];  Service: Orthopedics;  Laterality: Left;  [name] VIDEO BRONCHOSCOPY Bilateral 04/20/2013   Procedure: VIDEO BRONCHOSCOPY WITH FLUORO;  Surgeon: Elsie Stain, MD;  Location: [facility];  Service: Cardiopulmonary;  Laterality: Bilateral;    OB History  Gravida  5   Para  4   Term      Preterm  4   AB      Living  3     SAB      TAB      Ectopic      Multiple      Live Births               Home Medications    Prior to Admission medications   Medication Sig Start Date End Date Taking? Authorizing Provider  ALPRAZolam (XANAX) 1 MG tablet TAKE ONE TABLET BY MOUTH DAILY AS NEEDED FOR ANXIETY . MAX OF 1 TABLET PER DAY. 04/26/19  Yes Breeback, Jade L, PA-C  Biotin  5000 MCG TABS Take 1 tablet by mouth 2 (two) times daily.   Yes [provider]  busPIRone (BUSPAR) 7.5 MG tablet Take 1 tablet (7.5 mg total) by mouth 3 (three) times daily. 01/12/19  Yes Breeback, Jade L, PA-C  Calcium Citrate-Vitamin D (CALCIUM CITRATE + PO) Take 1 tablet by mouth daily.    Yes [provider]  dexamethasone (DECADRON) 4 MG tablet Take 3 tablets for migraine now. 06/20/19  Yes Breeback, Jade L, PA-C  lisinopril-hydrochlorothiazide (ZESTORETIC) 20-12.5 MG tablet Take 1 tablet by mouth daily. NEED LABS 07/28/19  Yes Breeback, Jade L, PA-C  Multiple Vitamin (MULTIVITAMIN) tablet Take 1 tablet by mouth 3 (three) times daily.   Yes [provider]  ondansetron (ZOFRAN ODT) 8 MG disintegrating tablet Take 1 tablet (8 mg total) by mouth every 8 (eight) hours as needed for nausea or vomiting. 06/30/19  Yes Breeback, Jade L, PA-C  venlafaxine XR (EFFEXOR-XR) 150 MG 24 hr capsule Take one tablet. 01/12/19  Yes Breeback, Jade L, PA-C  venlafaxine XR (EFFEXOR-XR) 75 MG 24 hr capsule Take 1 capsule (75 mg total) by mouth daily with breakfast. NEEDS APPT 07/27/19  Yes Breeback, Jade L, PA-C  AMBULATORY NON FORMULARY MEDICATION Glucometer lancets and test strips to test sugars once a day for excessive sleepiness with concern for low blood sugar issues. 02/12/18   Breeback, Royetta Car, PA-C  ferrous sulfate 325 (65 FE) MG tablet Take 1 tablet (325 mg total) by mouth 2 (two) times daily with a meal. 12/21/15   Hommel, Sean, DO  HYDROcodone-homatropine (HYDROMET) 5-1.5 MG/5ML syrup Take 67mL PO HS PRN cough 01/12/20   Kandra Nicolas, MD  predniSONE (DELTASONE) 20 MG tablet Take one tab by mouth twice daily for 4 days, then one daily for 3 days. Take with food. 01/12/20   Kandra Nicolas, MD  pyridOXINE (VITAMIN B-6) 25 MG tablet Take by mouth.    [provider]  rizatriptan (MAXALT) 10 MG tablet Take 1 tablet (10 mg total) by mouth as needed for migraine. May repeat in 2  hours if needed 07/01/19   Breeback, Jade L, PA-C  Vitamin D, Ergocalciferol, (DRISDOL) 1.25 MG (50000 UT) CAPS capsule TAKE 1 CAPSULE BY MOUTH EVERY 7 DAYS. 04/27/19   Donella Stade, PA-C    Family History Family History  Problem Relation Age of Onset  . Colon cancer Other        grandmother  . Heart attack Other        grandfather  . Hyperlipidemia Other   . Diabetes Father        father  . Hypertension Father   . Hypertension Mother   . Asthma Daughter     Social History Social History  Tobacco Use  . Smoking status: Never Smoker  . Smokeless tobacco: Never Used  Substance Use Topics  . Alcohol use: Yes    Comment: 1 glass per month  . Drug use: No     Allergies   Levofloxacin, Penicillins, and Levaquin [levofloxacin in d5w]   Review of Systems Review of Systems + sore throat + cough No pleuritic pain, but feels tight in anterior chest No wheezing + nasal congestion + post-nasal drainage No sinus pain/pressure No itchy/red eyes ? earache No hemoptysis + SOB No fever, + chills/sweats No nausea No vomiting No abdominal pain No diarrhea No urinary symptoms No skin rash + fatigue + myalgias + headache   Physical Exam Triage Vital Signs ED Triage Vitals  Enc Vitals Group     BP 01/12/20 0854 131/79     Pulse Rate 01/12/20 0854 70     Resp 01/12/20 0854 17     Temp 01/12/20 0854 98.4 F (36.9 C)     Temp Source 01/12/20 0854 Oral     SpO2 01/12/20 0854 100 %     Weight 01/12/20 0859 158 lb (71.7 kg)     Height 01/12/20 0859 5\' 5"  (1.651 m)     Head Circumference --      Peak Flow --      Pain Score 01/12/20 0858 0     Pain Loc --      Pain Edu? --      Excl. in [place]? --    No data found.  Updated Vital Signs BP 131/79 (BP Location: Right Arm)   Pulse 70   Temp 98.4 F (36.9 C) (Oral)   Resp 17   Ht 5\' 5"  (1.651 m)   Wt 71.7 kg   LMP 12/29/2019 (Approximate)   SpO2 100%   BMI 26.29 kg/m   Visual Acuity Right Eye Distance:     Left Eye Distance:   Bilateral Distance:    Right Eye Near:   Left Eye Near:    Bilateral Near:     Physical Exam Nursing notes and Vital Signs reviewed. Appearance:  Patient appears stated age, and in no acute distress Eyes:  Pupils are equal, round, and reactive to light and accomodation.  Extraocular movement is intact.  Conjunctivae are not inflamed  Ears:  Canals normal.  Tympanic membranes normal.  Nose:  Mildly congested turbinates.  No sinus tenderness.   Pharynx:  Normal Neck:  Supple.  Mildly enlarged lateral nodes are present, tender to palpation on the left.   Lungs:  Clear to auscultation.  Breath sounds are equal.  Moving air well. Chest:  Distinct tenderness to palpation over the mid-sternum.  Heart:  Regular rate and rhythm without murmurs, rubs, or gallops.  Abdomen:  Nontender without masses or hepatosplenomegaly.  Bowel sounds are present.  No CVA or flank tenderness.  Extremities:  No edema.  Skin:  No rash present.   UC Treatments / Results  Labs (all labs ordered are listed, but only abnormal results are displayed) Labs Reviewed - No data to display  EKG   Radiology No results found.  Procedures Procedures (including critical care time)  Medications Ordered in UC Medications - No data to display  Initial Impression / Assessment and Plan / UC Course  I have reviewed the triage vital signs and the nursing notes.  Pertinent labs & imaging results that were available during my care of the patient were reviewed by me and considered in my medical decision making (  see chart for details).    There is no evidence of bacterial infection today.  Suspect a component of reactive airways disease.  Begin prednisone burst/taper.  Patient requests Hydromet; rx given for bedtime use (#69mL, no refill). Followup with Family Doctor if not improved in 7 to 10 days.   Final Clinical Impressions(s) / UC Diagnoses   Final diagnoses:  Viral URI with cough   Costochondritis     Discharge Instructions     Take plain guaifenesin (1200mg  extended release tabs such as Mucinex) twice daily, with plenty of water, for cough and congestion.  Get adequate rest.   May use Afrin nasal spray (or generic oxymetazoline) each morning for about 5 days and then discontinue.  Also recommend using saline nasal spray several times daily and saline nasal irrigation (AYR is a common brand).  Use Flonase nasal spray each morning after using Afrin nasal spray and saline nasal irrigation. Try warm salt water gargles for sore throat.  Stop all antihistamines for now (Xyzal, etc), and other non-prescription cough/cold preparations. May take Tylenol as needed for body aches, chest/sternum discomfort, etc. If needed, may apply ice pack to sternum for 20 to 30 minutes, 3 to 4 times daily  Continue until pain decreases.       ED Prescriptions    Medication Sig Dispense Auth. Provider   predniSONE (DELTASONE) 20 MG tablet Take one tab by mouth twice daily for 4 days, then one daily for 3 days. Take with food. 11 tablet Kandra Nicolas, MD   HYDROcodone-homatropine (HYDROMET) 5-1.5 MG/5ML syrup Take 4mL PO HS PRN cough 25 mL Kandra Nicolas, MD        Kandra Nicolas, MD 01/12/20 [id number]

## 2020-01-12 NOTE — Discharge Instructions (Addendum)
Take plain guaifenesin (1200mg  extended release tabs such as Mucinex) twice daily, with plenty of water, for cough and congestion.  Get adequate rest.   May use Afrin nasal spray (or generic oxymetazoline) each morning for about 5 days and then discontinue.  Also recommend using saline nasal spray several times daily and saline nasal irrigation (AYR is a common brand).  Use Flonase nasal spray each morning after using Afrin nasal spray and saline nasal irrigation. Try warm salt water gargles for sore throat.  Stop all antihistamines for now (Xyzal, etc), and other non-prescription cough/cold preparations. May take Tylenol as needed for body aches, chest/sternum discomfort, etc. If needed, may apply ice pack to sternum for 20 to 30 minutes, 3 to 4 times daily  Continue until pain decreases.

## 2020-02-13 ENCOUNTER — Telehealth: Payer: Self-pay | Admitting: Physician Assistant

## 2020-02-13 ENCOUNTER — Ambulatory Visit: Payer: Self-pay | Admitting: Physician Assistant

## 2020-02-13 NOTE — Telephone Encounter (Signed)
Pt called at 9:58 to cancel her appt due to an emergency that just came up.

## 2020-02-14 NOTE — Telephone Encounter (Signed)
Ok to take off the schedule.

## 2020-03-19 ENCOUNTER — Other Ambulatory Visit: Payer: Self-pay

## 2020-03-19 ENCOUNTER — Encounter: Payer: Self-pay | Admitting: Medical-Surgical

## 2020-03-19 ENCOUNTER — Ambulatory Visit: Payer: BC Managed Care – PPO | Admitting: Medical-Surgical

## 2020-03-19 ENCOUNTER — Ambulatory Visit (INDEPENDENT_AMBULATORY_CARE_PROVIDER_SITE_OTHER): Payer: BC Managed Care – PPO

## 2020-03-19 VITALS — BP 106/71 | HR 81 | Temp 98.2°F | Ht 65.0 in | Wt 145.1 lb

## 2020-03-19 DIAGNOSIS — R079 Chest pain, unspecified: Secondary | ICD-10-CM

## 2020-03-19 DIAGNOSIS — R0602 Shortness of breath: Secondary | ICD-10-CM

## 2020-03-19 NOTE — Progress Notes (Signed)
Subjective:    CC: Left rib pain  HPI: Pleasant 49 year old female presenting today for evaluation of left rib pain.  Notes that she was leaning across the couch to grab one of her son's toys and had to stretch to get it.  This occurred several days ago and since then she has had pain with breathing, coughing, sneezing, and any motion affecting the left ribs.  She has been using ice as well as her narcotics that were prescribed by pain management.  Endorses feeling short of breath but is unsure if this is related to pain with breathing or actual shortness of breath.  Denies fever, chills, and upper respiratory symptoms.  Notes that she does have a history of severe lung issues and complications that landed her in the hospital.  Is concerned that she may have pneumonia or some other issues such as Covid.  Requesting a Covid test today.  I reviewed the past medical history, family history, social history, surgical history, and allergies today and no changes were needed.  Please see the problem list section below in epic for further details.  Past Medical History: Past Medical History:  Diagnosis Date  . Anemia   . Anxiety   . Arthritis   . Chronic pain    has pain contract for Norco 5/325mg   . GERD (gastroesophageal reflux disease)   . Hyperlipidemia   . Hypertension   . Insomnia   . Pneumococcal pneumonia ([place]) 2000  . Pneumonia    Past Surgical History: Past Surgical History:  Procedure Laterality Date  . CESAREAN SECTION     x4  . CHEST TUBE INSERTION    . chest tubes  2000  . dysplastic mole removed     right ankle  . GASTRIC BYPASS    . INTRAUTERINE DEVICE (IUD) INSERTION N/A 07/11/2014   Procedure: INTRAUTERINE DEVICE (IUD) INSERTION;  Surgeon: Emily Filbert, MD;  Location: [facility];  Service: Gynecology;  Laterality: N/A;  . IUD REMOVAL N/A 07/11/2014   Procedure: INTRAUTERINE DEVICE (IUD) REMOVAL;  Surgeon: Emily Filbert, MD;  Location: [facility];  Service: Gynecology;   Laterality: N/A;  . mini gastric bypass  2011  . recoonstruction of right lung    . SHOULDER ARTHROSCOPY WITH SUBACROMIAL DECOMPRESSION, ROTATOR CUFF REPAIR AND BICEP TENDON REPAIR Left 05/20/2017   Procedure: LEFT SHOULDER ARTHROSCOPY WITH DEBRIDEMENT, ACROMIOPLASTY, DISTAL CLAVICAL EXCISION, ROTATOR CUFF REPAIR, OPEN BICEP TENODESIS;  Surgeon: Hiram Gash, MD;  Location: [facility];  Service: Orthopedics;  Laterality: Left;  [name] VIDEO BRONCHOSCOPY Bilateral 04/20/2013   Procedure: VIDEO BRONCHOSCOPY WITH FLUORO;  Surgeon: Elsie Stain, MD;  Location: [facility];  Service: Cardiopulmonary;  Laterality: Bilateral;   Social History: Social History   Socioeconomic History  . Marital status: Legally Separated    Spouse name: Not on file  . Number of children: Not on file  . Years of education: Not on file  . Highest education level: Not on file  Occupational History  . Occupation: Furniture conservator/restorer: [employer]  Tobacco Use  . Smoking status: Never Smoker  . Smokeless tobacco: Never Used  Substance and Sexual Activity  . Alcohol use: Yes    Comment: 1 glass per month  . Drug use: No  . Sexual activity: Yes    Partners: Male    Birth control/protection: I.U.D.  Other Topics Concern  . Not on file  Social History Narrative  . Not on file   Social Determinants  of Health   Financial Resource Strain:   . Difficulty of Paying Living Expenses:   Food Insecurity:   . Worried About Charity fundraiser in the Last Year:   . Arboriculturist in the Last Year:   Transportation Needs:   . Film/video editor (Medical):   [name] Lack of Transportation (Non-Medical):   Physical Activity:   . Days of Exercise per Week:   . Minutes of Exercise per Session:   Stress:   . Feeling of Stress :   Social Connections:   . Frequency of Communication with Friends and Family:   . Frequency of Social Gatherings with Friends and Family:   . Attends Religious  Services:   . Active Member of Clubs or Organizations:   . Attends Archivist Meetings:   [name] Marital Status:    Family History: Family History  Problem Relation Age of Onset  . Colon cancer Other        grandmother  . Heart attack Other        grandfather  . Hyperlipidemia Other   . Diabetes Father        father  . Hypertension Father   . Hypertension Mother   . Asthma Daughter    Allergies: Allergies  Allergen Reactions  . Levofloxacin Hives  . Penicillins Hives  . Levaquin [Levofloxacin In D5w] Hives    Has tolerated avelox   Medications: See med rec.  Review of Systems: See HPI for pertinent positives and negatives.   Objective:    General: Well Developed, well nourished, and in no acute distress.  Neuro: Alert and oriented x3.  HEENT: Normocephalic, atraumatic.  Skin: Warm and dry. Cardiac: Regular rate and rhythm, no murmurs rubs or gallops, no lower extremity edema.  Respiratory: Clear to auscultation bilaterally. Not using accessory muscles, speaking in full sentences. MSK: Tenderness to palpation along the left lower border of the ribs at the midclavicular line extending into the abdominal muscle.   Impression and Recommendations:    1. Left-sided chest pain We will get x-rays of the ribs bilaterally with chest images as well.  Presentation and tenderness suspicious for rib etiology but cannot rule out intercostal muscles or upper abdominal muscle strain.  Also consider possible pneumonia.  For now recommend conservative treatment with RICE measures.  Advised patient to contact her pain management doctor and let them know her current pain management regimen is not helping with her new rib pain.  Advised to start ibuprofen 600 mg every 6 hours to help manage her discomfort and reduce inflammation. - DG Ribs Bilateral W/Chest; Future  2. Shortness of breath Due to her shortness of breath, history of lung disease, and recent public exposure, we will go  ahead and test her for Covid as she requested.  Advised patient to take precautions with quarantine until results are available. - Novel Coronavirus, NAA ([facility])  Return for chronic condition follow up with PCP as directed; if symptoms worsen or fail to improve. ___________________________________________ Clearnce Sorrel, DNP, APRN, FNP-BC [facility] and [place]

## 2020-03-19 NOTE — Patient Instructions (Signed)

## 2020-03-21 LAB — SARS-COV-2, NAA 2 DAY TAT

## 2020-03-21 LAB — NOVEL CORONAVIRUS, NAA: SARS-CoV-2, NAA: NOT DETECTED

## 2020-06-22 ENCOUNTER — Encounter: Payer: Self-pay | Admitting: Physician Assistant

## 2020-06-22 ENCOUNTER — Telehealth (INDEPENDENT_AMBULATORY_CARE_PROVIDER_SITE_OTHER): Payer: BC Managed Care – PPO | Admitting: Physician Assistant

## 2020-06-22 VITALS — HR 87 | Temp 97.8°F

## 2020-06-22 DIAGNOSIS — Z20822 Contact with and (suspected) exposure to covid-19: Secondary | ICD-10-CM | POA: Diagnosis not present

## 2020-06-22 DIAGNOSIS — J3489 Other specified disorders of nose and nasal sinuses: Secondary | ICD-10-CM

## 2020-06-22 DIAGNOSIS — F419 Anxiety disorder, unspecified: Secondary | ICD-10-CM

## 2020-06-22 DIAGNOSIS — R059 Cough, unspecified: Secondary | ICD-10-CM

## 2020-06-22 DIAGNOSIS — F411 Generalized anxiety disorder: Secondary | ICD-10-CM | POA: Diagnosis not present

## 2020-06-22 DIAGNOSIS — I10 Essential (primary) hypertension: Secondary | ICD-10-CM

## 2020-06-22 MED ORDER — ALPRAZOLAM 1 MG PO TABS: ORAL_TABLET | ORAL | 5 refills | Status: DC

## 2020-06-22 MED ORDER — HYDROCHLOROTHIAZIDE 25 MG PO TABS: ORAL_TABLET | ORAL | 1 refills | Status: DC

## 2020-06-22 MED ORDER — HYDROCODONE-HOMATROPINE 5-1.5 MG/5ML PO SYRP: ORAL_SOLUTION | ORAL | 0 refills | Status: DC

## 2020-06-22 MED ORDER — ALBUTEROL SULFATE HFA 108 (90 BASE) MCG/ACT IN AERS: 2.0000 | INHALATION_SPRAY | Freq: Four times a day (QID) | RESPIRATORY_TRACT | 0 refills | Status: DC | PRN

## 2020-06-22 NOTE — Progress Notes (Signed)
Patient ID: Kimberly Wade, female   DOB: 1971-07-24, 49 y.o.   MRN: [id number] .Marland KitchenVirtual Visit via Video Note  I connected with Kimberly Wade on 06/22/20 at  1:20 PM EDT by a video enabled telemedicine application and verified that I am speaking with the correct person using two identifiers.  Location: Patient: home Provider: clinic   I discussed the limitations of evaluation and management by telemedicine and the availability of in person appointments. The patient expressed understanding and agreed to proceed.  History of Present Illness: Pt is a 49 yo female with HTN, anxiety and URI symptoms. Pt woke up yesterday not feeling well and then this morning feeling terrible. She has dry cough. No fever, chills, loss of smell or taste, GI symptoms. She has chest tightness, sinus pressure and drainage, SOB. She is using OTC medications. She is having trouble sleeping at night. She has headache.   Pt is not vaccinated.  Father died in 05-02-2023 from covid.  Aunt in ICU with covid for 16 weeks.   Needs xanax refilled.   Taking BP medication needs HCTZ refilled.     .. Active Ambulatory Problems    Diagnosis Date Noted   Insomnia 12/24/2012   Depression 12/24/2012   Migraine 12/24/2012   Hyperlipidemia 12/24/2012   Vitamin D deficiency 12/24/2012   Essential hypertension, benign 12/24/2012   Anxiety 12/24/2012   S/P gastric bypass 12/24/2012   Increased PTH level 12/24/2012   GERD (gastroesophageal reflux disease) 12/24/2012   Aspiration into lower respiratory tract 05/02/2013   Primary osteoarthritis of right hand 02/02/2014   Tension headache 08/08/2014   Ovarian cyst, right 09/14/2015   Metrorrhagia 09/18/2015   Anemia 10/26/2015   Right trigger finger 05/05/2016   Low back pain without sciatica 08/08/2016   Generalized anxiety disorder 08/09/2016   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 01/30/2017    Overweight (BMI 25.0-29.9) 02/01/2017   Acute shoulder bursitis, left 04/03/2017   Biceps muscle tear, left, subsequent encounter 06/08/2017   Complete tear of left rotator cuff 06/08/2017   S/P shoulder surgery 02/12/2018   Chronic left shoulder pain 02/12/2018   No energy 02/17/2018   Biceps rupture, proximal, right, initial encounter 06/03/2018   Raynaud's disease without gangrene 03/29/2019   Post concussion syndrome 03/29/2019   Resolved Ambulatory Problems    Diagnosis Date Noted   Lingular pneumonia 12/24/2012   Type 2 diabetes mellitus ([place]) 12/24/2012   Sinusitis, chronic 12/24/2012   Diabetic retinopathy ([place]) 12/24/2012   Right hand pain 05/05/2016   Acute cystitis 06/18/2016   Influenza-like illness 08/29/2016   Acute non-recurrent maxillary sinusitis 10/06/2017   Past Medical History:  Diagnosis Date   Arthritis    Chronic pain    Hypertension    Pneumococcal pneumonia ([place]) 2000   Pneumonia    Reviewed med, allergy, problem list.    Observations/Objective: No acute distress Normal breathing Dry cough Normal mood and appearance.   .. Today's Vitals   06/22/20 1329  Pulse: 87  Temp: 97.8 F (36.6 C)  TempSrc: Oral   There is no height or weight on file to calculate BMI.     Assessment and Plan: Marland KitchenMarland KitchenDiagnoses and all orders for this visit:  Suspected COVID-19 virus infection -     albuterol (VENTOLIN HFA) 108 (90 Base) MCG/ACT inhaler; Inhale 2 puffs into the lungs every 6 (six) hours as needed.  Generalized anxiety disorder -     ALPRAZolam (XANAX) 1 MG tablet; TAKE ONE  TABLET BY MOUTH DAILY AS NEEDED FOR ANXIETY . MAX OF 1 TABLET PER DAY.  Anxiety -     ALPRAZolam (XANAX) 1 MG tablet; TAKE ONE TABLET BY MOUTH DAILY AS NEEDED FOR ANXIETY . MAX OF 1 TABLET PER DAY.  Essential hypertension, benign -     hydrochlorothiazide (HYDRODIURIL) 25 MG tablet; TAKE 1 TABLET BY MOUTH DAILY AS NEEDED FOR LOWER EXTREMITY  SWELLING  Cough -     HYDROcodone-homatropine (HYDROMET) 5-1.5 MG/5ML syrup; Take 75mL PO every 6hrs PRN cough -     albuterol (VENTOLIN HFA) 108 (90 Base) MCG/ACT inhaler; Inhale 2 puffs into the lungs every 6 (six) hours as needed.  Sinus drainage   Patient just started having Covid-like symptoms day 2.  Needs to be covid tested. Pt will come into clinic today.  Encouraged immune support with vitamin D vitamin C zinc.  Encouraged symptomatic care with Tylenol Cold sinus severe.  Cough syrup sent to pharmacy.  Encourage patient to keep a watch of her oxygen saturation and not to let drop in her 56.  Rest and hydrate.  Follow-up if symptoms worsening or not improving.  Take good deep breaths every hour.  Sent albuterol to use as needed every 4-6 hours.  Refilled Xanax and HCTZ.   Follow Up Instructions:    I discussed the assessment and treatment plan with the patient. The patient was provided an opportunity to ask questions and all were answered. The patient agreed with the plan and demonstrated an understanding of the instructions.   The patient was advised to call back or seek an in-person evaluation if the symptoms worsen or if the condition fails to improve as anticipated.   Iran Planas, PA-C

## 2020-06-22 NOTE — Progress Notes (Signed)
Woke up this morning with burning (pins and needles) in chest & throat Cough with phlegm Headache Denies fever/chills Has some nausea

## 2020-06-22 NOTE — Addendum Note (Signed)
Addended by: Jamesetta So on: 06/22/2020 04:39 PM   Modules accepted: Orders

## 2020-06-24 LAB — SARS-COV-2, NAA 2 DAY TAT

## 2020-06-24 LAB — NOVEL CORONAVIRUS, NAA: SARS-CoV-2, NAA: NOT DETECTED

## 2020-06-24 LAB — SPECIMEN STATUS REPORT

## 2020-06-25 ENCOUNTER — Telehealth: Payer: Self-pay

## 2020-06-25 DIAGNOSIS — R059 Cough, unspecified: Secondary | ICD-10-CM

## 2020-06-25 MED ORDER — HYDROCODONE-HOMATROPINE 5-1.5 MG/5ML PO SYRP: ORAL_SOLUTION | ORAL | 0 refills | Status: DC

## 2020-06-25 NOTE — Addendum Note (Signed)
Addended byAnnamaria Helling on: 06/25/2020 04:52 PM   Modules accepted: Orders

## 2020-06-25 NOTE — Telephone Encounter (Signed)
Where does she want it sent. We cannot print it?

## 2020-06-25 NOTE — Telephone Encounter (Signed)
Kimberly Wade has requested a printed script for Rx Hydromet. Because the cough medicine is on back order at [facility].   If granted please call her when the script is ready for pick up.   (Call back ph # [phone]).

## 2020-06-25 NOTE — Telephone Encounter (Signed)
-----   Message from Donella Stade, PA-C sent at 06/25/2020  6:00 AM EST ----- [facility] news. Negative for covid!

## 2020-06-25 NOTE — Telephone Encounter (Signed)
Patient would like sent to [facility] in [place]. RX pended. Please sign.

## 2020-06-25 NOTE — Addendum Note (Signed)
Addended by: Donella Stade on: 06/25/2020 05:08 PM   Modules accepted: Orders

## 2020-06-25 NOTE — Progress Notes (Signed)
[facility] news. Negative for covid!

## 2020-06-25 NOTE — Telephone Encounter (Signed)
Sent!

## 2020-07-23 ENCOUNTER — Other Ambulatory Visit: Payer: Self-pay | Admitting: Neurology

## 2020-07-23 DIAGNOSIS — R059 Cough, unspecified: Secondary | ICD-10-CM

## 2020-07-23 DIAGNOSIS — Z20822 Contact with and (suspected) exposure to covid-19: Secondary | ICD-10-CM

## 2020-07-23 MED ORDER — ALBUTEROL SULFATE HFA 108 (90 BASE) MCG/ACT IN AERS: 2.0000 | INHALATION_SPRAY | Freq: Four times a day (QID) | RESPIRATORY_TRACT | 0 refills | Status: DC | PRN

## 2020-07-23 MED ORDER — VITAMIN D (ERGOCALCIFEROL) 1.25 MG (50000 UNIT) PO CAPS
ORAL_CAPSULE | ORAL | 0 refills | Status: DC
Start: 2020-07-23 — End: 2020-07-27

## 2020-07-26 ENCOUNTER — Other Ambulatory Visit: Payer: Self-pay | Admitting: Neurology

## 2020-07-26 DIAGNOSIS — R059 Cough, unspecified: Secondary | ICD-10-CM

## 2020-07-26 NOTE — Telephone Encounter (Signed)
Patient called requesting a refill of cough syrup.  She states allergies are getting worse with trees/poinsettas and she is coughing a lot.  Last written 06/25/2020 #120 mL, no refills. Please advise.

## 2020-07-27 ENCOUNTER — Telehealth (INDEPENDENT_AMBULATORY_CARE_PROVIDER_SITE_OTHER): Payer: BC Managed Care – PPO | Admitting: Nurse Practitioner

## 2020-07-27 ENCOUNTER — Other Ambulatory Visit: Payer: Self-pay | Admitting: Neurology

## 2020-07-27 ENCOUNTER — Encounter: Payer: Self-pay | Admitting: Nurse Practitioner

## 2020-07-27 DIAGNOSIS — R059 Cough, unspecified: Secondary | ICD-10-CM

## 2020-07-27 DIAGNOSIS — Z20822 Contact with and (suspected) exposure to covid-19: Secondary | ICD-10-CM

## 2020-07-27 MED ORDER — ALBUTEROL SULFATE HFA 108 (90 BASE) MCG/ACT IN AERS: 2.0000 | INHALATION_SPRAY | Freq: Four times a day (QID) | RESPIRATORY_TRACT | 0 refills | Status: DC | PRN

## 2020-07-27 MED ORDER — VITAMIN D (ERGOCALCIFEROL) 1.25 MG (50000 UNIT) PO CAPS
ORAL_CAPSULE | ORAL | 0 refills | Status: DC
Start: 2020-07-27 — End: 2021-12-26

## 2020-07-27 MED ORDER — AZITHROMYCIN 250 MG PO TABS: ORAL_TABLET | ORAL | 0 refills | Status: DC

## 2020-07-27 MED ORDER — HYDROCODONE-HOMATROPINE 5-1.5 MG/5ML PO SYRP: ORAL_SOLUTION | ORAL | 0 refills | Status: DC

## 2020-07-27 NOTE — Telephone Encounter (Signed)
Because of the controlled nature of cough syrup we are not supposed to refill without an appt. For cough allergies suggest OTC delsym or I could send some tessalon pearls over as well.

## 2020-07-27 NOTE — Progress Notes (Signed)
Virtual Visit via Telephone Note  I connected with  Kimberly Wade on 07/27/20 at  2:30 PM EST by telephone and verified that I am speaking with the correct person using two identifiers.   I discussed the limitations, risks, security and privacy concerns of performing an evaluation and management service by telephone and the availability of in person appointments. I also discussed with the patient that there may be a patient responsible charge related to this service. The patient expressed understanding and agreed to proceed.  Participating parties included in this telephone visit include: the patient and the nurse practitioner listed The patient is: at home I am: in the office  Subjective:    CC: Cough with emesis  HPI: Kimberly Wade is a pleasant 49 year old female presenting today for cough with emesis that have been going on for several days.   She denies any known sick contacts. She reports that her son that lives with her is showing no signs of illness.   She endorses fever, chills, rhinorrhea, congestion, sinus pressure, nausea, vomiting, diarrhea, and headache.   She denies loss of taste, loss of smell, chest pain, wheezing, shortness of breath.  She has been taking medication for nausea and headache. She did have a small amount of Hydromet cough syrup that she took last night to help with her cough and this was effective, but she used the last of the prescription.   Past medical history, Surgical history, Family history not pertinant except as noted below, Social history, Allergies, and medications have been entered into the medical record, reviewed, and corrections made.   Review of Systems:  All review of systems negative except what is listed in the HPI  Objective:    General: Speaking clearly in complete sentences without any shortness of breath.   Cough is present Audible congestion is present.  Alert and oriented x3.   Normal judgment.  No apparent acute  distress.  Impression and Recommendations:   1. Cough Symptoms and presentation consistent with acute cough of unknown origin.  Her symptoms could be consistent with COVID-19- she may want to consider COVID testing to rule this out.  Given the severity of the cough and symptoms, will begin treatment with azithromycin and hydromet today.  Recommendations for OTC medications provided along with supportive care recommendations.  Follow-up if symptoms worsen or fail to improve.  - HYDROcodone-homatropine (HYDROMET) 5-1.5 MG/5ML syrup; Take 74mL PO every 6hrs PRN cough  Dispense: 120 mL; Refill: 0 - azithromycin (ZITHROMAX) 250 MG tablet; Take 2 tabs (500 mg) together on the first day, then 1 tab (250 mg) daily until prescription complete.  Dispense: 10 tablet; Refill: 0     I discussed the assessment and treatment plan with the patient. The patient was provided an opportunity to ask questions and all were answered. The patient agreed with the plan and demonstrated an understanding of the instructions.   The patient was advised to call back or seek an in-person evaluation if the symptoms worsen or if the condition fails to improve as anticipated.  I provided 20 minutes of non-face-to-face time during this TELEPHONE encounter.    Orma Render, NP

## 2020-07-27 NOTE — Telephone Encounter (Signed)
Patient made aware. She states she has tried those in the past with no relief. Video visit scheduled to discuss. Scheduled with Perley Jain schedule is full. [place].

## 2020-08-31 ENCOUNTER — Telehealth (INDEPENDENT_AMBULATORY_CARE_PROVIDER_SITE_OTHER): Payer: BC Managed Care – PPO | Admitting: Nurse Practitioner

## 2020-08-31 ENCOUNTER — Encounter: Payer: Self-pay | Admitting: Nurse Practitioner

## 2020-08-31 ENCOUNTER — Telehealth: Payer: Self-pay

## 2020-08-31 DIAGNOSIS — R41840 Attention and concentration deficit: Secondary | ICD-10-CM | POA: Diagnosis not present

## 2020-08-31 DIAGNOSIS — J40 Bronchitis, not specified as acute or chronic: Secondary | ICD-10-CM | POA: Diagnosis not present

## 2020-08-31 DIAGNOSIS — S0001XD Abrasion of scalp, subsequent encounter: Secondary | ICD-10-CM | POA: Diagnosis not present

## 2020-08-31 MED ORDER — MUPIROCIN 2 % EX OINT: TOPICAL_OINTMENT | CUTANEOUS | 0 refills | Status: DC

## 2020-08-31 MED ORDER — HYDROCODONE-HOMATROPINE 5-1.5 MG/5ML PO SYRP
ORAL_SOLUTION | ORAL | 0 refills | Status: DC
Start: 2020-08-31 — End: 2020-09-10

## 2020-08-31 MED ORDER — PREDNISONE 20 MG PO TABS: 40.0000 mg | ORAL_TABLET | Freq: Every day | ORAL | 0 refills | Status: DC

## 2020-08-31 MED ORDER — HYDROCODONE-HOMATROPINE 5-1.5 MG/5ML PO SYRP: ORAL_SOLUTION | ORAL | 0 refills | Status: DC

## 2020-08-31 NOTE — Progress Notes (Signed)
Virtual Visit via Telephone Note  I connected with  Kimberly Wade on 08/31/20 at  9:50 AM EST by telephone and verified that I am speaking with the correct person using two identifiers.   I discussed the limitations, risks, security and privacy concerns of performing an evaluation and management service by telephone and the availability of in person appointments. I also discussed with the patient that there may be a patient responsible charge related to this service. The patient expressed understanding and agreed to proceed.  Participating parties included in this telephone visit include: The patient and the nurse practitioner listed.  The patient is: At home I am: In the office  Subjective:    CC: bronchitis/cough and attention issues  HPI: Kimberly Wade is a 50 y.o. year old female presenting today via telephone visit to discuss ongoing cough. She was diagnosed in [place] December with upper respiratory infection and prescribed cough medication and azithromycin. She reports the symptoms have improved with the exception of the cough. She reports night time coughing spells that interfere with her sleep. She does reports intermittent chills, but also states that she is having hot flashes and thinks that these are related to peri-menopausal symptoms.   She denies increased mucous production, sinus pressure or pain, drainage, rhinorrhea, fever, loss of taste, loss of smell, or sore throat.   She was around her daughter who tested positive for COVID last week, but she reports that her symptoms have not changed at all since that interaction and the symptoms she is experiencing have been ongoing since her infection in December.   Attention: She reports that she has a very difficult time remaining focused on tasks and finds herself often starting something then moving on to something else before finishing the task. She states that it can take a significant amount of time to do simple tasks  like prepare a meal because she is so easily distracted. She also feels like her mind races with all of the things that she needs to do and she can't focus. She tells me that this has been ongoing for quite some time and she has attributed it to her anxiety, but that is well controlled right now with medication. She is curious about adult onset ADHD.   Abrasion to Scalp: She also tells me that she has an abrasion to her scalp that has been present for a while and she is finding herself subconsciously picking at the area repeatedly. The area is not inflamed, reddened, or draining purulent material, but she does say that it will bleed often when she is picking it.   Past medical history, Surgical history, Family history not pertinant except as noted below, Social history, Allergies, and medications have been entered into the medical record, reviewed, and corrections made.   Review of Systems:  All review of systems negative except what is listed in the HPI  Objective:    General:  Patient speaking clearly in complete sentences. No shortness of breath noted.   Alert and oriented x3.   Normal judgment.  No apparent acute distress.  Impression and Recommendations:    1. Bronchitis Symptoms consistent with post infectious bronchitis.  Will try prednisone burst to help reducing inflammation in the lungs and re-order Hydromet cough syrup for night time use due to cough.  Recommend cough and deep breathing exercises during the day. Albuterol inhaler use as needed.  No signs of infection today, and although she did have potential exposure through her daughter, her symptoms have  not worsened and are not consistent with new infection. Do not think there is a need to test at this time.  Monitor for symptoms and let us know if they worsen or fail to improve.  - HYDROcodone-homatropine (HYDROMET) 5-1.5 MG/5ML syrup; Take 69mL PO every 6hrs PRN cough  Dispense: 120 mL; Refill: 0 - predniSONE (DELTASONE)  20 MG tablet; Take 2 tablets (40 mg total) by mouth daily with breakfast.  Dispense: 10 tablet; Refill: 0  2. Attention and concentration deficit Symptoms consistent with ADHD. She has never been formally tested for this.  Discussed that we can send her for testings with psychology, but she is concerned about the cost associated with this. She would prefer that we determine a course of treatment, if at all possible.  Will discuss with PCP about the option for trial of medication management for her symptoms and further evaluation.   3. Abrasion of scalp, subsequent encounter Abrasion with habitual picking most likely related to underlying anxiety or ADHD.  Recommend use of bactroban ointment to aid in healing and infection prevention. I am hopeful that the application of medication will also provide a reminder not to pick. Discussed with patient to remind herself when she feels the ointment not to continue to scratch or pick the area.  - mupirocin ointment (BACTROBAN) 2 %; Apply to sore on the top of head for 7-14 days one to two times a day.  Dispense: 30 g; Refill: 0  I discussed the assessment and treatment plan with the patient. The patient was provided an opportunity to ask questions and all were answered. The patient agreed with the plan and demonstrated an understanding of the instructions.   The patient was advised to call back or seek an in-person evaluation if the symptoms worsen or if the condition fails to improve as anticipated.  I provided 33 minutes of non-face-to-face time during this TELEPHONE encounter.    Orma Render, NP

## 2020-08-31 NOTE — Telephone Encounter (Signed)
Thank you for preparing the new prescription- it has been sent.  Unfortunately, most pharmacy's are out of the cough medication with codeine in our area. Hopefully she will be able to get this filled.

## 2020-08-31 NOTE — Telephone Encounter (Signed)
Pt called back and said that [facility] in [place] did not have any Hydromet in stock. She is requesting the Rx be re-routed to [facility] on [address] in [place]. Rx has been tee'd up below with the pharmacy changed and is ready for review and approval/denial.

## 2020-08-31 NOTE — Patient Instructions (Signed)
Attention Deficit Hyperactivity Disorder, Adult Attention deficit hyperactivity disorder (ADHD) is a mental health disorder that starts during childhood (neurodevelopmental disorder). For many people with ADHD, the disorder continues into the adult years. Treatment can help you manage your symptoms. What are the causes? The exact cause of ADHD is not known. Most experts believe genetics and environmental factors contribute to ADHD. What increases the risk? The following factors may make you more likely to develop this condition:  Having a family history of ADHD.  Being female.  Being born to a mother who smoked or drank alcohol during pregnancy.  Being exposed to lead or other toxins in the womb or [place] in life.  Being born before 52 weeks of pregnancy (prematurely) or at a low birth weight.  Having experienced a brain injury. What are the signs or symptoms? Symptoms of this condition depend on the type of ADHD. The two main types are inattentive and hyperactive-impulsive. Some people may have symptoms of both types. Symptoms of the inattentive type include:  Difficulty paying attention.  Making careless mistakes.  Not following instructions.  Being disorganized.  Avoiding tasks that require time and attention.  Losing and forgetting things.  Being easily distracted. Symptoms of the hyperactive-impulsive type include:  Restlessness.  Talking too much.  Interrupting.  Difficulty with: ? Sitting still. ? Feeling motivated. ? Relaxing. ? Waiting in line or waiting for a turn. In adults, this condition may lead to certain problems, such as:  Keeping jobs.  Performing tasks at work.  Having stable relationships.  Being on time or keeping to a schedule. How is this diagnosed? This condition is diagnosed based on your current symptoms and your history of symptoms. The diagnosis can be made by a health care provider such as a primary care provider or a mental health  care specialist. Your health care provider may use a symptom checklist or a behavior rating scale to evaluate your symptoms. He or she may also want to talk with people who have observed your behaviors throughout your life. How is this treated? This condition can be treated with medicines and behavior therapy. Medicines may be the best option to reduce impulsive behaviors and improve attention. Your health care provider may recommend:  Stimulant medicines. These are the most common medicines used for adult ADHD. They affect certain chemicals in the brain (neurotransmitters) and improve your ability to control your symptoms.  A non-stimulant medicine for adult ADHD (atomoxetine). This medicine increases a neurotransmitter called norepinephrine. It may take weeks to months to see effects from this medicine. Counseling and behavioral management are also important for treating ADHD. Counseling is often used along with medicine. Your health care provider may suggest:  Cognitive behavioral therapy (CBT). This type of therapy teaches you to replace negative thoughts and actions with positive thoughts and actions. When used as part of ADHD treatment, this therapy may also include: ? Coping strategies for organization, time management, impulse control, and stress reduction. ? Mindfulness and meditation training.  Behavioral management. You may work with a [facility] manager who is specially trained to help people with ADHD manage and organize activities and function more effectively. Follow these instructions at home: Medicines  Take over-the-counter and prescription medicines only as told by your health care provider.  Talk with your health care provider about the possible side effects of your medicines and how to manage them.   Lifestyle  Do not use drugs.  Do not drink alcohol if: ? Your health care provider tells you  not to drink. ? You are pregnant, may be pregnant, or are planning to become  pregnant.  If you drink alcohol: ? Limit how much you use to:  0-1 drink a day for women.  0-2 drinks a day for men. ? Be aware of how much alcohol is in your drink. In the [place] one drink equals one 12 oz bottle of beer (355 mL), one 5 oz glass of wine (148 mL), or one 1 oz glass of hard liquor (44 mL).  Get enough sleep.  Eat a healthy diet.  Exercise regularly. Exercise can help to reduce stress and anxiety.   General instructions  Learn as much as you can about adult ADHD, and work closely with your health care providers to find the treatments that work best for you.  Follow the same schedule each day.  Use reminder devices like notes, calendars, and phone apps to stay on time and organized.  Keep all follow-up visits as told by your health care provider and therapist. This is important. Where to find more information A health care provider may be able to recommend resources that are available online or over the phone. You could start with:  Attention Deficit Disorder Association (ADDA): PubAddiction.co.nz  National Institute of Mental Health [facility]): https://carter.com/ Contact a health care provider if:  Your symptoms continue to cause problems.  You have side effects from your medicine, such as: ? Repeated muscle twitches, coughing, or speech outbursts. ? Sleep problems. ? Loss of appetite. ? Dizziness. ? Unusually fast heartbeat. ? Stomach pains. ? Headaches.  You are struggling with anxiety, depression, or substance abuse. Get help right away if you:  Have a severe reaction to a medicine. If you ever feel like you may hurt yourself or others, or have thoughts about taking your own life, get help right away. You can go to the nearest emergency department or call:  Your local emergency services (911 in the [place]  A suicide crisis helpline, such as the [facility] at 825-383-6635. This is open 24 hours a day. Summary  ADHD is a mental  health disorder that starts during childhood (neurodevelopmental disorder) and often continues into the adult years.  The exact cause of ADHD is not known. Most experts believe genetics and environmental factors contribute to ADHD.  There is no cure for ADHD, but treatment with medicine, cognitive behavioral therapy, or behavioral management can help you manage your condition. This information is not intended to replace advice given to you by your health care provider. Make sure you discuss any questions you have with your health care provider. Document Revised: 12/27/2018 Document Reviewed: 12/27/2018 Elsevier Patient Education  2021 [place].    Acute Bronchitis, Adult  Acute bronchitis is when air tubes in the lungs (bronchi) suddenly get swollen. The condition can make it hard for you to breathe. In adults, acute bronchitis usually goes away within 2 weeks. A cough caused by bronchitis may last up to 3 weeks. Smoking, allergies, and asthma can make the condition worse. What are the causes? This condition is caused by:  Cold and flu viruses. The most common cause of this condition is the virus that causes the common cold.  Bacteria.  Substances that irritate the lungs, including: ? Smoke from cigarettes and other types of tobacco. ? Dust and pollen. ? Fumes from chemicals, gases, or burned fuel. ? Other materials that pollute indoor or outdoor air.  Close contact with someone who has acute bronchitis. What increases  the risk? The following factors may make you more likely to develop this condition:  A weak body's defense system. This is also called the immune system.  Any condition that affects your lungs and breathing, such as asthma. What are the signs or symptoms? Symptoms of this condition include:  A cough.  Coughing up clear, yellow, or green mucus.  Wheezing.  Chest congestion.  Shortness of breath.  A fever.  Body aches.  Chills.  A sore  throat. How is this treated? Acute bronchitis may go away over time without treatment. Your doctor may recommend:  Drinking more fluids.  Taking a medicine for a fever or cough.  Using a device that gets medicine into your lungs (inhaler).  Using a vaporizer or a humidifier. These are machines that add water or moisture in the air to help with coughing and poor breathing. Follow these instructions at home: Activity  Get a lot of rest.  Avoid places where there are fumes from chemicals.  Return to your normal activities as told by your doctor. Ask your doctor what activities are safe for you. Lifestyle  Drink enough fluids to keep your pee (urine) pale yellow.  Do not drink alcohol.  Do not use any products that contain nicotine or tobacco, such as cigarettes, e-cigarettes, and chewing tobacco. If you need help quitting, ask your doctor. Be aware that: ? Your bronchitis will get worse if you smoke or breathe in other people's smoke (secondhand smoke). ? Your lungs will heal faster if you quit smoking. General instructions  Take over-the-counter and prescription medicines only as told by your doctor.  Use an inhaler, cool mist vaporizer, or humidifier as told by your doctor.  Rinse your mouth often with salt water. To make salt water, dissolve -1 tsp (3-6 g) of salt in 1 cup (237 mL) of warm water.  Keep all follow-up visits as told by your doctor. This is important.   How is this prevented? To lower your risk of getting this condition again:  Wash your hands often with soap and water. If soap and water are not available, use hand sanitizer.  Avoid contact with people who have cold symptoms.  Try not to touch your mouth, nose, or eyes with your hands.  Make sure to get the flu shot every year.   Contact a doctor if:  Your symptoms do not get better in 2 weeks.  You vomit more than once or twice.  You have symptoms of loss of fluid from your body (dehydration).  These include: ? Dark urine. ? Dry skin or eyes. ? Increased thirst. ? Headaches. ? Confusion. ? Muscle cramps. Get help right away if:  You cough up blood.  You have chest pain.  You have very bad shortness of breath.  You become dehydrated.  You faint or keep feeling like you are going to faint.  You keep vomiting.  You have a very bad headache.  Your fever or chills get worse. These symptoms may be an emergency. Do not wait to see if the symptoms will go away. Get medical help right away. Call your local emergency services (911 in the [place] Do not drive yourself to the hospital. Summary  Acute bronchitis is when air tubes in the lungs (bronchi) suddenly get swollen. In adults, acute bronchitis usually goes away within 2 weeks.  Take over-the-counter and prescription medicines only as told by your doctor.  Drink enough fluid to keep your pee (urine) pale yellow.  Contact a  doctor if your symptoms do not improve after 2 weeks of treatment.  Get help right away if you cough up blood, faint, or have chest pain or shortness of breath. This information is not intended to replace advice given to you by your health care provider. Make sure you discuss any questions you have with your health care provider. Document Revised: 02/25/2019 Document Reviewed: 02/25/2019 Elsevier Patient Education  [place].

## 2020-08-31 NOTE — Telephone Encounter (Signed)
Pt aware Rx has been sent to the pharmacy. No further questions or concerns at this time.   

## 2020-09-04 ENCOUNTER — Telehealth: Payer: Self-pay

## 2020-09-04 NOTE — Telephone Encounter (Signed)
Pt had VV on 08/31/2020 regarding attention and was told "Will discuss with PCP about the option for trial of medication management for her symptoms and further evaluation." Pt states she has not heard anything back about this.

## 2020-09-05 ENCOUNTER — Encounter: Payer: Self-pay | Admitting: Nurse Practitioner

## 2020-09-05 NOTE — Telephone Encounter (Signed)
Please see MyChart message communication between Emeterio Reeve Early, DNP, AGNP-C and pt dated 09/05/2020

## 2020-09-10 ENCOUNTER — Telehealth (INDEPENDENT_AMBULATORY_CARE_PROVIDER_SITE_OTHER): Payer: BC Managed Care – PPO | Admitting: Physician Assistant

## 2020-09-10 ENCOUNTER — Encounter: Payer: Self-pay | Admitting: Physician Assistant

## 2020-09-10 ENCOUNTER — Other Ambulatory Visit: Payer: Self-pay | Admitting: Neurology

## 2020-09-10 VITALS — Temp 98.6°F | Ht 65.0 in | Wt 145.0 lb

## 2020-09-10 DIAGNOSIS — F330 Major depressive disorder, recurrent, mild: Secondary | ICD-10-CM

## 2020-09-10 DIAGNOSIS — F419 Anxiety disorder, unspecified: Secondary | ICD-10-CM | POA: Diagnosis not present

## 2020-09-10 DIAGNOSIS — R059 Cough, unspecified: Secondary | ICD-10-CM

## 2020-09-10 DIAGNOSIS — R41840 Attention and concentration deficit: Secondary | ICD-10-CM | POA: Diagnosis not present

## 2020-09-10 DIAGNOSIS — I10 Essential (primary) hypertension: Secondary | ICD-10-CM

## 2020-09-10 DIAGNOSIS — F5101 Primary insomnia: Secondary | ICD-10-CM

## 2020-09-10 DIAGNOSIS — Z20822 Contact with and (suspected) exposure to covid-19: Secondary | ICD-10-CM

## 2020-09-10 MED ORDER — TRAZODONE HCL 50 MG PO TABS: 50.0000 mg | ORAL_TABLET | Freq: Every day | ORAL | 1 refills | Status: DC

## 2020-09-10 MED ORDER — AMPHETAMINE-DEXTROAMPHET ER 10 MG PO CP24
10.0000 mg | ORAL_CAPSULE | Freq: Every day | ORAL | 0 refills | Status: DC
Start: 2020-09-10 — End: 2020-10-04

## 2020-09-10 NOTE — Progress Notes (Signed)
Never been diagnosed with ADHD Having a lot of trouble focusing, completing tasks  ADHD screen completed [id number] completed.

## 2020-09-10 NOTE — Progress Notes (Signed)
Patient ID: Kimberly Wade, female   DOB: 04/04/1971, 50 y.o.   MRN: [id number] .Marland KitchenVirtual Visit via Telephone Note  I connected with Kimberly Wade on 09/10/2020 at  9:30 AM EST by telephone and verified that I am speaking with the correct person using two identifiers.  Location: Patient: work Provider: clinic  .Marland KitchenParticipating in visit:  Patient: Kimberly Wade Provider: Iran Planas PA-C   I discussed the limitations, risks, security and privacy concerns of performing an evaluation and management service by telephone and the availability of in person appointments. I also discussed with the patient that there may be a patient responsible charge related to this service. The patient expressed understanding and agreed to proceed.   History of Present Illness: Patient is a 50 year old female with hypertension, migraines, GERD, insomnia, depression, anxiety, chronic pain who calls into the clinic to discuss lack of focus and poor concentration.  She honestly feels like she struggled with this for quite a while.  She is always had to reread things and not easily comprehend.  No recent med changes.  She has never had any formal testing. She is struggling with completing task.   No worsening anxiety or depression.   Insomnia still a problem. Trazodone worked well before and would like to try again.   .. Active Ambulatory Problems    Diagnosis Date Noted  . Insomnia 12/24/2012  . Depression 12/24/2012  . Migraine 12/24/2012  . Hyperlipidemia 12/24/2012  . Vitamin D deficiency 12/24/2012  . Essential hypertension, benign 12/24/2012  . Anxiety 12/24/2012  . S/P gastric bypass 12/24/2012  . Increased PTH level 12/24/2012  . GERD (gastroesophageal reflux disease) 12/24/2012  . Aspiration into lower respiratory tract 05/02/2013  . Primary osteoarthritis of right hand 02/02/2014  . Tension headache 08/08/2014  . Ovarian cyst, right 09/14/2015  . Metrorrhagia 09/18/2015  . Anemia 10/26/2015   . Right trigger finger 05/05/2016  . Low back pain without sciatica 08/08/2016  . Generalized anxiety disorder 08/09/2016  . 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 Overweight (BMI 25.0-29.9) 02/01/2017  . Acute shoulder bursitis, left 04/03/2017  . Biceps muscle tear, left, subsequent encounter 06/08/2017  . Complete tear of left rotator cuff 06/08/2017  . S/P shoulder surgery 02/12/2018  . Chronic left shoulder pain 02/12/2018  . No energy 02/17/2018  . Biceps rupture, proximal, right, initial encounter 06/03/2018  . Raynaud's disease without gangrene 03/29/2019  . Post concussion syndrome 03/29/2019   Resolved Ambulatory Problems    Diagnosis Date Noted  . Lingular pneumonia 12/24/2012  . Type 2 diabetes mellitus ([place]) 12/24/2012  . Sinusitis, chronic 12/24/2012  . Diabetic retinopathy ([place]) 12/24/2012  . Right hand pain 05/05/2016  . Acute cystitis 06/18/2016  . Influenza-like illness 08/29/2016  . Acute non-recurrent maxillary sinusitis 10/06/2017   Past Medical History:  Diagnosis Date  . Arthritis   . Chronic pain   . Hypertension   . Pneumococcal pneumonia ([place]) 2000  . Pneumonia    Reviewed med, allergy, problem list.  Observations/Objective: No acute distress Normal mood Normal breathing  .[name] Today's Vitals   09/10/20 0908  Temp: 98.6 F (37 C)  TempSrc: Oral  Weight: 145 lb (65.8 kg)  Height: 5\' 5"  (1.651 m)   Body mass index is 24.13 kg/m.   .. Adult ADHD Self Report Scale (most recent)    Adult ADHD Self-Report Scale (ASRS-v1.1) Symptom Checklist - 09/10/20 0900      Part A   1. How often  do you have trouble wrapping up the final details of a project, once the challenging parts have been done? Often  2. How often do you have difficulty getting things done in order when you have to do a task that requires organization? Often    3. How often do you have problems remembering appointments or  obligations? Often  4. When you have a task that requires a lot of thought, how often do you avoid or delay getting started? Often    5. How often do you fidget or squirm with your hands or feet when you have to sit down for a long time? Sometimes  6. How often do you feel overly active and compelled to do things, like you were driven by a motor? Sometimes      Part B   7. How often do you make careless mistakes when you have to work on a boring or difficult project? Often  8. How often do you have difficulty keeping your attention when you are doing boring or repetitive work? Often    9. How often do you have difficulty concentrating on what people say to you, even when they are speaking to you directly? Often  10. How often do you misplace or have difficulty finding things at home or at work? Very Often    85. How often are you distracted by activity or noise around you? Very Often  54. How often do you leave your seat in meetings or other situations in which you are expected to remain seated? Sometimes    13. How often do you feel restless or fidgety? Sometimes  14. How often do you have difficulty unwinding and relaxing when you have time to yourself? Often    15. How often do you find yourself talking too much when you are in social situations? Very Often  75. When you are in a conversation, how often do you find yourself finishing the sentences of the people you are talking to, before they can finish them themselves? Sometimes    17. How often do you have difficulty waiting your turn in situations when turn taking is required? Rarely  18. How often do you interrupt others when they are busy? Rarely           Assessment and Plan: Marland KitchenMarland KitchenEvella was seen today for adhd.  Diagnoses and all orders for this visit:  Poor concentration -     amphetamine-dextroamphetamine (ADDERALL XR) 10 MG 24 hr capsule; Take 1 capsule (10 mg total) by mouth daily. -     Ambulatory referral to  Psychology  Inattention -     amphetamine-dextroamphetamine (ADDERALL XR) 10 MG 24 hr capsule; Take 1 capsule (10 mg total) by mouth daily. -     Ambulatory referral to Psychology  Essential hypertension, benign  Anxiety  Mild episode of recurrent major depressive disorder (HCC)  Primary insomnia -     traZODone (DESYREL) 50 MG tablet; Take 1 tablet (50 mg total) by mouth at bedtime.   Pt did screen positive for ADD. She will still need formal testing. Referral placed.  She is on chronic pain medication, benzodiazapine and then a stimulant. She is going to need to come off the benzos. Pt agrees.  Low dose adderall to try.   Hx of insomnia that trazodone worked well. Would like to restart.   Follow up in 1 month.   Follow Up Instructions:    I discussed the assessment and treatment plan with the patient.  The patient was provided an opportunity to ask questions and all were answered. The patient agreed with the plan and demonstrated an understanding of the instructions.   The patient was advised to call back or seek an in-person evaluation if the symptoms worsen or if the condition fails to improve as anticipated.  I provided 20 minutes of non-face-to-face time during this encounter.   Iran Planas, PA-C

## 2020-09-10 NOTE — Telephone Encounter (Signed)
Ben with [facility] called and left vm wanting to verify we wanted to prescribe adderall xr 10 mg due to patient's current medications including opioids and xanax. Wanted me to call to verify this at [phone].  Please advise.

## 2020-09-10 NOTE — Telephone Encounter (Signed)
Patient called back and left two messages actually wanting these prescriptions sent to [facility]. Please send if appropriate.

## 2020-09-12 NOTE — Telephone Encounter (Signed)
Kimberly Wade, I did not know patient was on a opioid? I did not see on med list. Until she gets formal testing we can only try non-stimulant options for potential ADHD symptoms. Would she like to try wellbutrin or strattera?

## 2020-09-13 NOTE — Telephone Encounter (Signed)
Spoke with pharmacy.  Patient getting percocet 10/325 filled monthly at a different pharmacy. She takes up to 5 tablets a day. She states she has been on this for years. Pharmacist did let her fill RX two days ago.  Called patient to let her know she can't take this along with Opioid and Xanax. She wants to stop Xanax instead. She states she feels so much better on medication. She has been able to function better. She wanted me to ask if she could just stay on Adderall and stop Xanax. Please advise?

## 2020-09-24 NOTE — Telephone Encounter (Signed)
For now yes. But we will be checking to make sure xanax is not in system and you still have to get formal testing.

## 2020-09-24 NOTE — Addendum Note (Signed)
Addended by: Donella Stade on: 09/24/2020 03:04 PM   Modules accepted: Orders

## 2020-09-25 ENCOUNTER — Telehealth (INDEPENDENT_AMBULATORY_CARE_PROVIDER_SITE_OTHER): Payer: BC Managed Care – PPO | Admitting: Family Medicine

## 2020-09-25 ENCOUNTER — Encounter: Payer: Self-pay | Admitting: Physician Assistant

## 2020-09-25 DIAGNOSIS — R519 Headache, unspecified: Secondary | ICD-10-CM

## 2020-09-25 DIAGNOSIS — R059 Cough, unspecified: Secondary | ICD-10-CM

## 2020-09-25 DIAGNOSIS — Z20822 Contact with and (suspected) exposure to covid-19: Secondary | ICD-10-CM

## 2020-09-25 MED ORDER — HYDROCODONE-HOMATROPINE 5-1.5 MG/5ML PO SYRP
5.0000 mL | ORAL_SOLUTION | Freq: Three times a day (TID) | ORAL | 0 refills | Status: DC | PRN
Start: 2020-09-25 — End: 2020-10-01

## 2020-09-25 NOTE — Patient Instructions (Signed)
Nice to meet you today. I'm sorry you aren't feeling well.  I sent in the Hycodan to [facility]. Hopefully they have some. If not, continue over-the-counter medicines.  Keep your usual migraine regimen. Add Flonase and Mucinex to help with the congestion. We are out of COVID tests here, so try to get one from the [facility] website or other community site.  Follow-up if symptoms worsen or fail to improve.   It was great to see you!  You have a viral upper respiratory infection. Antibiotics are not needed for this.  Viral infections usually take 7-10 days to resolve.  The cough can last a few weeks to go away.  Given the current COVID-19 outbreak, it is my professional recommendation that for your symptoms you should self-isolate for at least 7 days from when your symptoms started. Please do not return to work until you are fever free for at least 3 days without medications and significant improvement of your symptoms.  If you develop severe shortness of breath, uncontrolled fevers, coughing up blood, confusion, chest pain, or signs of dehydration (such as significantly decreased urine amounts or dizziness with standing) please CALL the ER and then GO to the ER.  Push fluids and get plenty of rest.  Call clinic with questions.  I hope you start feeling better soon!  Over the counter medications that may be helpful for symptoms:    . Guaifenesin 1200 mg extended release tabs twice daily, with plenty of water o For cough and congestion o Brand name: Mucinex   . Pseudoephedrine 30 mg, one or two tabs every 4 to 6 hours o For sinus congestion o Brand name: Sudafed o You must get this from the pharmacy counter.  . Oxymetazoline nasal spray each morning, one spray in each nostril, for NO MORE THAN 3 days  o For nasal and sinus congestion o Brand name: Afrin . Saline nasal spray or Saline Nasal Irrigation 3-5 times a day o For nasal and sinus congestion o Brand names: [place] or  AYR . Fluticasone nasal spray, one spray in each nostril, each morning (after oxymetazoline and saline, if used) o For nasal and sinus congestion o Brand name: Flonase . Warm salt water gargles  o For sore throat o Every few hours as needed . Alternate ibuprofen 400-600 mg and acetaminophen 1000 mg every 4-6 hours o For fever, body aches, headache o Brand names: Motrin or Advil and Tylenol . Dextromethorphan 12-hour cough version 30 mg every 12 hours  o For cough o Brand name: Delsym Stop all other cold medications for now (Nyquil, Dayquil, Tylenol Cold, Theraflu, etc) and other non-prescription cough/cold preparations. Many of these have the same ingredients listed above and could cause an overdose of medication.   Herbal treatments that have been shown to be helpful in some patients include: Vitamin C 1000mg  per day Vitamin D 4000iU per day Zinc 100mg  per day Quercetin 25-500mg  twice a day Melatonin 5-10mg  at bedtime  General Instructions . Allow your body to rest . Drink PLENTY of fluids . Isolate yourself from everyone, even family, until test results have returned  If your COVID-19 test is positive . Then you ARE INFECTED and you can pass the virus to others . You must quarantine from others for a minimum of  o 10 days since symptoms started AND o You are fever free for 24 hours WITHOUT any medication to reduce fever AND o Your symptoms are improving . Do not go to the store  or other public areas . Do not go around household members who are not known to be infected with COVID-19 . If you MUST leave your area of quarantine (example: go to a bathroom you share with others in your home), you must o Wear a mask o Wash your hands thoroughly o Wipe down any surfaces you touch . Do not share food, drinks, towels, or other items with other persons . Dispose of your own tissues, food containers, etc  Once you have recovered, please continue good preventive care measures,  including:  . wearing a mask when in public . wash your hands frequently . avoid touching your face/nose/eyes . cover coughs/sneezes with the inside of your elbow . stay out of crowds . keep a 6 foot distance from others  If you develop severe shortness of breath, uncontrolled fevers, coughing up blood, confusion, chest pain, or signs of dehydration (such as significantly decreased urine amounts or dizziness with standing) please go to the ER.

## 2020-09-25 NOTE — Progress Notes (Signed)
Virtual Visit via Telephone Note  I connected with  Kimberly Wade on 09/25/20 at  1:00 PM EST by telephone and verified that I am speaking with the correct person using two identifiers.   I discussed the limitations, risks, security and privacy concerns of performing an evaluation and management service by telephone and the availability of in person appointments. I also discussed with the patient that there may be a patient responsible charge related to this service. The patient expressed understanding and agreed to proceed.  Participating parties included in this telephone visit include: The patient and the nurse practitioner listed.  The patient is: At home I am: In the office - [facility]   Subjective:    CC: headaches, fever  HPI: Kimberly Wade is a 50 y.o. year old female presenting today via telephone visit to discuss headaches. Reports she woke up with a headache this morning. Cough and congestion for the past 2-3 days.   Headache - frontal sinus region, constant throbbing, 5/10 currently but was 10/10. Some mild nausea with headache, mild nausea yesterday as well. Reports it is not unusual for have nausea since she had her bypass surgery.  Cough - some sputum production, clear,  Clear runny nose, no change in taste/smell Significant fatigue, no body aches worse than her baseline Sinus pressure around eyes Full neck ROM Fever 100.3 F around 10:30 this morning after feeling flushed; rechecked to 99.0 F now.   Requesting hycodan as cough is keeping her from sleeping.  Tried sumartriptan, OTC migraine meds, Rx percocet with some relief of migraine.  Son tested negative for COVID 2 weeks ago, viral infection cleared. [place] has been mostly staying at home, did go out shopping a few days ago.   Not COVID Vaccines, no previous infection  No success with tessalon in past.  Denies shortness of breath, dyspnea, chest pain or pressure, vision  changes.  Past medical history, Surgical history, Family history not pertinant except as noted below, Social history, Allergies, and medications have been entered into the medical record, reviewed, and corrections made.   Review of Systems:  All review of systems negative except what is listed in the HPI  Objective:    General:  Patient speaking clearly in complete sentences. No shortness of breath noted.   Alert and oriented x3.   Normal judgment.  No apparent acute distress.  Impression and Recommendations:    1. Sinus headache  2. Cough  3. Suspected COVID-19 virus infection    Get COVID tested. Our office is currently out of tests. Recommend she look on the website or check with other community sites and follow quarantine recommendations pending test results. Recommend continue OTC medications including Flonase and Mucinex in the meantime.  Cough. Note from January says Laretta Bolster, NP attempted to send script for hycodan, but not showing in med list or PDMP. Will try to send a few days worth, but patient is aware that many pharmacies are running out of these cough medicines. Recommend OTC options if not available. Pulmonary hygiene encouraged.   Headache - continue current migraine regimen.   I discussed the assessment and treatment plan with the patient. The patient was provided an opportunity to ask questions and all were answered. The patient agreed with the plan and demonstrated an understanding of the instructions.   The patient was advised to call back or seek an in-person evaluation if the symptoms worsen or if the condition fails to improve as anticipated.  I provided 20 minutes  of non-face-to-face time during this TELEPHONE encounter.    Terrilyn Saver, NP

## 2020-09-27 ENCOUNTER — Telehealth: Payer: Self-pay | Admitting: Neurology

## 2020-09-27 DIAGNOSIS — R519 Headache, unspecified: Secondary | ICD-10-CM

## 2020-09-27 DIAGNOSIS — J069 Acute upper respiratory infection, unspecified: Secondary | ICD-10-CM

## 2020-09-27 DIAGNOSIS — R059 Cough, unspecified: Secondary | ICD-10-CM

## 2020-09-27 MED ORDER — AZITHROMYCIN 250 MG PO TABS: ORAL_TABLET | ORAL | 0 refills | Status: DC

## 2020-09-27 NOTE — Telephone Encounter (Signed)
Unfortunately it is too early to call in any more cough medicine or controlled substances. She needs to try to reserve this for bedtime use and try OTC options during the day. Continue other supportive therapies we discussed. I will go ahead and send in a z-pack just in case there is a bacterial sinobronchial component forming, but it may not help if we are only dealing a virus. Ask her to try to get a pulse ox (drug stores or [employer] has them pretty affordable) and make sure she goes to the ED if her oxygen goes below 90% or if she starts having worsening shortness of breath, uncontrolled fevers, coughing up blood, chest pain, etc.

## 2020-09-27 NOTE — Telephone Encounter (Signed)
Patient left vm stating she was seen virtually for possible Covid. She took a rapid home test today that is positive. She states she has an excruciating headache, cough, and is coughing up dark brown mucous. Temp running around 100. She is having to use cough syrup every 8 hours and still coughing. She is scared of running out as this is the only thing that is allowing her to sleep. Please advise.

## 2020-09-28 NOTE — Telephone Encounter (Signed)
Patient made aware of results/recommendations.

## 2020-10-01 ENCOUNTER — Telehealth (INDEPENDENT_AMBULATORY_CARE_PROVIDER_SITE_OTHER): Payer: Self-pay | Admitting: Physician Assistant

## 2020-10-01 VITALS — Ht 65.0 in | Wt 145.0 lb

## 2020-10-01 DIAGNOSIS — R059 Cough, unspecified: Secondary | ICD-10-CM

## 2020-10-01 DIAGNOSIS — U071 COVID-19: Secondary | ICD-10-CM

## 2020-10-01 MED ORDER — DEXAMETHASONE 4 MG PO TABS: 4.0000 mg | ORAL_TABLET | Freq: Two times a day (BID) | ORAL | 0 refills | Status: DC

## 2020-10-01 MED ORDER — HYDROCODONE-HOMATROPINE 5-1.5 MG/5ML PO SYRP
5.0000 mL | ORAL_SOLUTION | Freq: Two times a day (BID) | ORAL | 0 refills | Status: DC | PRN
Start: 2020-10-01 — End: 2020-12-19

## 2020-10-01 NOTE — Progress Notes (Signed)
Patient ID: Kimberly Wade, female   DOB: 06-Oct-1970, 50 y.o.   MRN: [id number] .Marland KitchenVirtual Visit via Video Note  I connected with Kimberly Wade on 10/01/2020 at  3:20 PM EST by a video enabled telemedicine application and verified that I am speaking with the correct person using two identifiers.  Location: Patient: home Provider: clinic  .Marland KitchenParticipating in visit:  Patient: Kimberly Wade Provider: Iran Planas PA-C   I discussed the limitations of evaluation and management by telemedicine and the availability of in person appointments. The patient expressed understanding and agreed to proceed.  History of Present Illness: Patient is a 50 year old female who calls into the clinic to discuss ongoing COVID-19 symptoms.  She has had symptoms since about 09/23/2020 and tested positive on 09/27/2020.  She was seen by Kimberly Le, NP in the clinic on 2/8 and given Hycodan cough syrup.  She called back on 2/10 and was given a Z-Pak. She has not been vaccinated.  She continues to run a temperature around 100.  She is still very congested and coughing up darker mucus.  Her cough is more productive at night.  She denies any significant shortness of breath.  She has extensive fatigue.  She has an ongoing headache.  The cough syrup seems to help a lot.  She would like a refill. She has one more day on zpak.   .. Active Ambulatory Problems    Diagnosis Date Noted  . Insomnia 12/24/2012  . Depression 12/24/2012  . Migraine 12/24/2012  . Hyperlipidemia 12/24/2012  . Vitamin D deficiency 12/24/2012  . Essential hypertension, benign 12/24/2012  . Anxiety 12/24/2012  . S/P gastric bypass 12/24/2012  . Increased PTH level 12/24/2012  . GERD (gastroesophageal reflux disease) 12/24/2012  . Aspiration into lower respiratory tract 05/02/2013  . Primary osteoarthritis of right hand 02/02/2014  . Tension headache 08/08/2014  . Ovarian cyst, right 09/14/2015  . Metrorrhagia 09/18/2015  . Anemia 10/26/2015  . Right  trigger finger 05/05/2016  . Low back pain without sciatica 08/08/2016  . Generalized anxiety disorder 08/09/2016  . 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 Overweight (BMI 25.0-29.9) 02/01/2017  . Acute shoulder bursitis, left 04/03/2017  . Biceps muscle tear, left, subsequent encounter 06/08/2017  . Complete tear of left rotator cuff 06/08/2017  . S/P shoulder surgery 02/12/2018  . Chronic left shoulder pain 02/12/2018  . No energy 02/17/2018  . Biceps rupture, proximal, right, initial encounter 06/03/2018  . Raynaud's disease without gangrene 03/29/2019  . Post concussion syndrome 03/29/2019   Resolved Ambulatory Problems    Diagnosis Date Noted  . Lingular pneumonia 12/24/2012  . Type 2 diabetes mellitus ([place]) 12/24/2012  . Sinusitis, chronic 12/24/2012  . Diabetic retinopathy ([place]) 12/24/2012  . Right hand pain 05/05/2016  . Acute cystitis 06/18/2016  . Influenza-like illness 08/29/2016  . Acute non-recurrent maxillary sinusitis 10/06/2017   Past Medical History:  Diagnosis Date  . Arthritis   . Chronic pain   . Hypertension   . Pneumococcal pneumonia ([place]) 2000  . Pneumonia    Reviewed med, allergy, problem list.   Observations/Objective: No acute distress Normal breathing. No wheezing.  Dry cough.  Pale appearance. Fatigue and laying on the couch.   .. Today's Vitals   10/01/20 1451  Weight: 145 lb (65.8 kg)  Height: 5\' 5"  (1.651 m)   Body mass index is 24.13 kg/m.    Assessment and Plan: Marland KitchenMarland KitchenBrittiney was seen today for covid positive.  Diagnoses and all  orders for this visit:  COVID-19 virus infection -     dexamethasone (DECADRON) 4 MG tablet; Take 1 tablet (4 mg total) by mouth 2 (two) times daily with a meal. -     HYDROcodone-homatropine (HYCODAN) 5-1.5 MG/5ML syrup; Take 5 mLs by mouth every 12 (twelve) hours as needed for cough.  Cough -     dexamethasone (DECADRON) 4 MG tablet; Take 1  tablet (4 mg total) by mouth 2 (two) times daily with a meal. -     HYDROcodone-homatropine (HYCODAN) 5-1.5 MG/5ML syrup; Take 5 mLs by mouth every 12 (twelve) hours as needed for cough.  Pt continues to have Covid symptoms on Day 8. Added dexamethasone and refilled cough syrup.  Discussed good deep breaths. Albuterol inhaler as needed.  Good hydration and nutrition.  Discussed warning signs of sudden worsening breathing, CP, leg pain/swelling get to UC or ED.  Reassured fatigue may take some time.  Finish zpak.  Marland KitchenMarland KitchenPDMP reviewed during this encounter.    Follow Up Instructions:    I discussed the assessment and treatment plan with the patient. The patient was provided an opportunity to ask questions and all were answered. The patient agreed with the plan and demonstrated an understanding of the instructions.   The patient was advised to call back or seek an in-person evaluation if the symptoms worsen or if the condition fails to improve as anticipated.  Iran Planas, PA-C

## 2020-10-01 NOTE — Progress Notes (Signed)
Tested positive for Covid 09/27/2020 Headache Cough Congestion/coughing up dark mucous Temp running around 100  Saw Taylor 09/25/2020 given cough syrup Called 09/27/2020 and was given zpack

## 2020-10-02 ENCOUNTER — Encounter: Payer: Self-pay | Admitting: Physician Assistant

## 2020-10-04 ENCOUNTER — Other Ambulatory Visit: Payer: Self-pay | Admitting: Neurology

## 2020-10-04 DIAGNOSIS — F330 Major depressive disorder, recurrent, mild: Secondary | ICD-10-CM

## 2020-10-04 DIAGNOSIS — R41840 Attention and concentration deficit: Secondary | ICD-10-CM

## 2020-10-04 MED ORDER — VENLAFAXINE HCL ER 150 MG PO CP24: ORAL_CAPSULE | ORAL | 1 refills | Status: DC

## 2020-10-04 NOTE — Telephone Encounter (Signed)
Patient left vm asking for a refill of Adderall and Effexor. Effexor sent. Adderall last written 09/10/2020 #30 no refills. Pended for 10/10/2020.

## 2020-10-05 MED ORDER — AMPHETAMINE-DEXTROAMPHET ER 10 MG PO CP24: 10.0000 mg | ORAL_CAPSULE | Freq: Every day | ORAL | 0 refills | Status: DC

## 2020-10-05 NOTE — Telephone Encounter (Signed)
She has, but no appts available until June.

## 2020-10-05 NOTE — Telephone Encounter (Signed)
Ok willing to keep on it with 3 month follow up until June appt.

## 2020-10-05 NOTE — Telephone Encounter (Signed)
Please confirm patient has been called and made appt for formal ADHD testing.

## 2020-10-11 ENCOUNTER — Other Ambulatory Visit: Payer: Self-pay | Admitting: Physician Assistant

## 2020-10-11 DIAGNOSIS — Z20822 Contact with and (suspected) exposure to covid-19: Secondary | ICD-10-CM

## 2020-10-11 DIAGNOSIS — U071 COVID-19: Secondary | ICD-10-CM

## 2020-10-11 DIAGNOSIS — R059 Cough, unspecified: Secondary | ICD-10-CM

## 2020-10-12 ENCOUNTER — Encounter: Payer: Self-pay | Admitting: Family Medicine

## 2020-10-12 ENCOUNTER — Ambulatory Visit (INDEPENDENT_AMBULATORY_CARE_PROVIDER_SITE_OTHER): Payer: Self-pay | Admitting: Family Medicine

## 2020-10-12 ENCOUNTER — Other Ambulatory Visit: Payer: Self-pay

## 2020-10-12 VITALS — BP 114/78 | HR 86 | Temp 98.7°F | Wt 136.0 lb

## 2020-10-12 DIAGNOSIS — I951 Orthostatic hypotension: Secondary | ICD-10-CM

## 2020-10-12 DIAGNOSIS — R059 Cough, unspecified: Secondary | ICD-10-CM

## 2020-10-12 MED ORDER — BUDESONIDE-FORMOTEROL FUMARATE 80-4.5 MCG/ACT IN AERO: 2.0000 | INHALATION_SPRAY | Freq: Two times a day (BID) | RESPIRATORY_TRACT | 3 refills | Status: DC

## 2020-10-12 NOTE — Patient Instructions (Signed)

## 2020-10-12 NOTE — Progress Notes (Signed)
Acute Office Visit  Subjective:    Patient ID: Kimberly Wade, female    DOB: Dec 21, 1970, 50 y.o.   MRN: [id number]  No chief complaint on file.   HPI Patient is in today for high blood pressure, headaches. She recently had COVID with possible secondary infection requiring a z-pak earlier this month. States symptoms are lingering, but slightly improving. She had a negative COVID test and is now here for high blood pressures at home and other recent appointments. At pain management appointment earlier this week her systolic pressure was in the 170s. Patient states she had been running high like this at home the past several days, but noted this morning was better. She brought her home monitor in today to compare it with ours and it appears accurate.   On-going tension headaches since COVID. Definitely improved by still lingering. Fatigue slowly improving. Productive cough continues which may play a role in headaches, but also BP has been high at home. Headache is improved today compared to earlier this week. She thinks she may have missed a few days of blood pressure meds and has not been staying well hydrated.   Starting to have episodes of feeling like she's spinning. Comes on randomly, not predictable. Occurs at least once per hour and lasts for about 30 seconds to a minute usually, before it disappears . Episodes are not described lightheadedness/pre-syncope feelings. Feels like she just got off of a roller coaster. Not usually associated with nausea. The more we discussed she stated that she does seem to notice them mostly occurring after getting up from sitting down for awhile.  Reports she has had very limited activity the past several weeks while trying to recover from [place]. Still doesn't have much appetite, but is trying to eat a healthy. She keeps a water bottle with her but thinks she drinks less than 2 (16oz water bottles) per day. Diarrhea earlier this week.  No new shortness of  breath, worsening cough, fevers, chest pain, rashes, vision changes, gait changes or other neuro symptoms.      Past Medical History:  Diagnosis Date  . Anemia   . Anxiety   . Arthritis   . Chronic pain    has pain contract for Norco 5/325mg   . GERD (gastroesophageal reflux disease)   . Hyperlipidemia   . Hypertension   . Insomnia   . Pneumococcal pneumonia ([place]) 2000  . Pneumonia     Past Surgical History:  Procedure Laterality Date  . CESAREAN SECTION     x4  . CHEST TUBE INSERTION    . chest tubes  2000  . dysplastic mole removed     right ankle  . GASTRIC BYPASS    . INTRAUTERINE DEVICE (IUD) INSERTION N/A 07/11/2014   Procedure: INTRAUTERINE DEVICE (IUD) INSERTION;  Surgeon: Emily Filbert, MD;  Location: [facility];  Service: Gynecology;  Laterality: N/A;  . IUD REMOVAL N/A 07/11/2014   Procedure: INTRAUTERINE DEVICE (IUD) REMOVAL;  Surgeon: Emily Filbert, MD;  Location: [facility];  Service: Gynecology;  Laterality: N/A;  . mini gastric bypass  2011  . recoonstruction of right lung    . SHOULDER ARTHROSCOPY WITH SUBACROMIAL DECOMPRESSION, ROTATOR CUFF REPAIR AND BICEP TENDON REPAIR Left 05/20/2017   Procedure: LEFT SHOULDER ARTHROSCOPY WITH DEBRIDEMENT, ACROMIOPLASTY, DISTAL CLAVICAL EXCISION, ROTATOR CUFF REPAIR, OPEN BICEP TENODESIS;  Surgeon: Hiram Gash, MD;  Location: [facility];  Service: Orthopedics;  Laterality: Left;  [name] VIDEO BRONCHOSCOPY Bilateral 04/20/2013  Procedure: VIDEO BRONCHOSCOPY WITH FLUORO;  Surgeon: Elsie Stain, MD;  Location: [facility];  Service: Cardiopulmonary;  Laterality: Bilateral;    Family History  Problem Relation Age of Onset  . Colon cancer Other        grandmother  . Heart attack Other        grandfather  . Hyperlipidemia Other   . Diabetes Father        father  . Hypertension Father   . Hypertension Mother   . Asthma Daughter     Social History   Socioeconomic History  . Marital status: Soil scientist     Spouse name: Not on file  . Number of children: Not on file  . Years of education: Not on file  . Highest education level: Not on file  Occupational History  . Occupation: Furniture conservator/restorer: [employer]  Tobacco Use  . Smoking status: Never Smoker  . Smokeless tobacco: Never Used  Substance and Sexual Activity  . Alcohol use: Yes    Comment: 1 glass per month  . Drug use: No  . Sexual activity: Yes    Partners: Male    Birth control/protection: I.U.D.  Other Topics Concern  . Not on file  Social History Narrative  . Not on file   Social Determinants of Health   Financial Resource Strain: Not on file  Food Insecurity: Not on file  Transportation Needs: Not on file  Physical Activity: Not on file  Stress: Not on file  Social Connections: Not on file  Intimate Partner Violence: Not on file    Outpatient Medications Prior to Visit  Medication Sig Dispense Refill  . albuterol (VENTOLIN HFA) 108 (90 Base) MCG/ACT inhaler Inhale 2 puffs into the lungs every 6 (six) hours as needed. 6.7 g 0  . amphetamine-dextroamphetamine (ADDERALL XR) 10 MG 24 hr capsule Take 1 capsule (10 mg total) by mouth daily. 30 capsule 0  . azithromycin (ZITHROMAX Z-PAK) 250 MG tablet Take 2 tablets (500 mg) on  Day 1,  followed by 1 tablet (250 mg) once daily on Days 2 through 5. 6 tablet 0  . Biotin 5000 MCG TABS Take 1 tablet by mouth 2 (two) times daily.    . Calcium Citrate-Vitamin D (CALCIUM CITRATE + PO) Take 1 tablet by mouth daily.     [name] dexamethasone (DECADRON) 4 MG tablet Take 1 tablet (4 mg total) by mouth 2 (two) times daily with a meal. 10 tablet 0  . ferrous sulfate 325 (65 FE) MG tablet Take 1 tablet (325 mg total) by mouth 2 (two) times daily with a meal. 180 tablet 0  . gabapentin (NEURONTIN) 300 MG capsule Take 300 mg by mouth daily. Up to 3 caps daily prn    . hydrochlorothiazide (HYDRODIURIL) 25 MG tablet TAKE 1 TABLET BY MOUTH DAILY AS NEEDED FOR LOWER EXTREMITY  SWELLING 90 tablet 1  . HYDROcodone-homatropine (HYCODAN) 5-1.5 MG/5ML syrup Take 5 mLs by mouth every 12 (twelve) hours as needed for cough. 60 mL 0  . lisinopril-hydrochlorothiazide (ZESTORETIC) 20-25 MG tablet Take 1 tablet by mouth daily.    . Lofexidine HCl (LUCEMYRA) 0.18 MG TABS Take 1 tablet by mouth as needed.    [name] losartan (COZAAR) 25 MG tablet Take 1 tablet by mouth daily.    . Multiple Vitamin (MULTIVITAMIN) tablet Take 1 tablet by mouth 3 (three) times daily.    . naloxone (NARCAN) nasal spray 4 mg/0.1 mL Place into the nose as  needed.    . ondansetron (ZOFRAN ODT) 8 MG disintegrating tablet Take 1 tablet (8 mg total) by mouth every 8 (eight) hours as needed for nausea or vomiting. 20 tablet 0  . oxyCODONE-acetaminophen (PERCOCET) 10-325 MG tablet Take 1 tablet by mouth 5 (five) times daily as needed.    . potassium chloride SA (KLOR-CON) 20 MEQ tablet Take 20 mEq by mouth daily.    . SUMAtriptan (IMITREX) 50 MG tablet Take 50 mg by mouth every 2 (two) hours as needed for migraine. May repeat in 2 hours if headache persists or recurs.    . traZODone (DESYREL) 50 MG tablet Take 1 tablet (50 mg total) by mouth at bedtime. 90 tablet 1  . venlafaxine XR (EFFEXOR-XR) 150 MG 24 hr capsule Take one tablet. 90 capsule 1  . Vitamin D, Ergocalciferol, (DRISDOL) 1.25 MG (50000 UNIT) CAPS capsule TAKE 1 CAPSULE BY MOUTH EVERY 7 DAYS/ appt/labs for further refills 12 capsule 0   No facility-administered medications prior to visit.    Allergies  Allergen Reactions  . Levofloxacin Hives  . Penicillins Hives  . Levaquin [Levofloxacin In D5w] Hives    Has tolerated avelox    Review of Systems All review of systems negative except what is listed in the HPI     Objective:    Physical Exam Vitals reviewed.  Constitutional:      Appearance: Normal appearance. She is normal weight.  HENT:     Head: Normocephalic and atraumatic.  Cardiovascular:     Rate and Rhythm: Normal rate and  regular rhythm.     Pulses: Normal pulses.     Heart sounds: Normal heart sounds.  Pulmonary:     Effort: Pulmonary effort is normal. No respiratory distress.     Breath sounds: Normal breath sounds. No wheezing or rhonchi.  Abdominal:     General: Abdomen is flat.     Palpations: Abdomen is soft.  Musculoskeletal:        General: Normal range of motion.     Cervical back: Normal range of motion and neck supple.  Skin:    General: Skin is warm and dry.  Neurological:     General: No focal deficit present.     Mental Status: She is alert and oriented to person, place, and time. Mental status is at baseline.     Cranial Nerves: No cranial nerve deficit.     Motor: No weakness.     Coordination: Coordination normal.     Gait: Gait normal.  Psychiatric:        Mood and Affect: Mood normal.        Behavior: Behavior normal.        Thought Content: Thought content normal.        Judgment: Judgment normal.     Temp 98.7 F (37.1 C)   Wt 136 lb (61.7 kg)   SpO2 100%   BMI 22.63 kg/m  Wt Readings from Last 3 Encounters:  10/12/20 136 lb (61.7 kg)  10/01/20 145 lb (65.8 kg)  09/10/20 145 lb (65.8 kg)    Health Maintenance Due  Topic Date Due  . COVID-19 Vaccine (1) Never done  . PAP SMEAR-Modifier  10/10/2019    There are no preventive care reminders to display for this patient.   Lab Results  Component Value Date   TSH 2.06 02/12/2018   Lab Results  Component Value Date   WBC 6.8 02/12/2018   HGB 11.6 (L) 02/12/2018   HCT  33.8 (L) 02/12/2018   MCV 91.6 02/12/2018   PLT 212 02/12/2018   Lab Results  Component Value Date   NA 143 03/25/2019   K 3.9 03/25/2019   CO2 27 03/25/2019   GLUCOSE 100 (H) 03/25/2019   BUN 11 03/25/2019   CREATININE 0.50 03/25/2019   BILITOT 0.3 03/25/2019   ALKPHOS 55 04/04/2016   AST 35 03/25/2019   ALT 40 (H) 03/25/2019   PROT 6.2 03/25/2019   ALBUMIN 3.5 (L) 04/04/2016   CALCIUM 8.7 03/25/2019   ANIONGAP 5 05/19/2017    Lab Results  Component Value Date   CHOL 161 07/21/2017   Lab Results  Component Value Date   HDL 61 07/21/2017   Lab Results  Component Value Date   LDLCALC 83 07/21/2017   Lab Results  Component Value Date   TRIG 83 07/21/2017   Lab Results  Component Value Date   CHOLHDL 2.6 07/21/2017   Lab Results  Component Value Date   HGBA1C 5.2 03/25/2019       Assessment & Plan:   1. Cough Ongoing post-COVID cough. She is requesting more cough syrup with codeine, however this has been refilled about 10 days ago and she is already taking several other narcotics. Do not feel comfortable adding more at this time. Recommend she try OTC cough syrup. Will also add Symbicort inhaler to see if this may help. Pulmonology referral placed as she has had significant pulmonary history and it would be helpful to have their input.   - budesonide-formoterol (SYMBICORT) 80-4.5 MCG/ACT inhaler; Inhale 2 puffs into the lungs 2 (two) times daily.  Dispense: 1 each; Refill: 3 - Ambulatory referral to Pulmonology  2. Orthostatic hypotension Patient's blood pressure was excellent today. She admits to possibly missing some doses of her regular BP meds over the past few days in addition to not eating or drinking much plus having diarrhea earlier this week. I believe she was dehydrated and deconditioned following COVID. In no acute distress today.  Slightly orthostatic today, which would attribute to her dizziness associated with quick position changes. Educated on orthostatic hypotension and recommend she try to increase oral hydration, change positions slowly, and eat a balanced diet. Sounds like her headaches were related to the elevated BP over the past few days as they have significantly improved today. Encouraged her to keep checking her BP daily at home and take her medications as prescribed. She was educated on signs/symptoms requiring urgent evaluation.   Follow-up if symptoms worsen or fail to  improve.    Terrilyn Saver, NP

## 2020-11-02 ENCOUNTER — Other Ambulatory Visit: Payer: Self-pay | Admitting: Physician Assistant

## 2020-11-02 DIAGNOSIS — R41840 Attention and concentration deficit: Secondary | ICD-10-CM

## 2020-11-03 ENCOUNTER — Other Ambulatory Visit: Payer: Self-pay | Admitting: Physician Assistant

## 2020-11-03 DIAGNOSIS — R41840 Attention and concentration deficit: Secondary | ICD-10-CM

## 2020-11-07 MED ORDER — AMPHETAMINE-DEXTROAMPHET ER 10 MG PO CP24: 10.0000 mg | ORAL_CAPSULE | Freq: Every day | ORAL | 0 refills | Status: DC

## 2020-11-13 ENCOUNTER — Other Ambulatory Visit: Payer: Self-pay | Admitting: Physician Assistant

## 2020-11-13 DIAGNOSIS — Z20822 Contact with and (suspected) exposure to covid-19: Secondary | ICD-10-CM

## 2020-11-13 DIAGNOSIS — R059 Cough, unspecified: Secondary | ICD-10-CM

## 2020-11-13 MED ORDER — ALBUTEROL SULFATE HFA 108 (90 BASE) MCG/ACT IN AERS: 2.0000 | INHALATION_SPRAY | Freq: Four times a day (QID) | RESPIRATORY_TRACT | 0 refills | Status: DC | PRN

## 2020-12-06 ENCOUNTER — Institutional Professional Consult (permissible substitution): Payer: Self-pay | Admitting: Internal Medicine

## 2020-12-07 ENCOUNTER — Ambulatory Visit: Payer: Self-pay | Admitting: Sports Medicine

## 2020-12-09 ENCOUNTER — Other Ambulatory Visit: Payer: Self-pay | Admitting: Physician Assistant

## 2020-12-09 DIAGNOSIS — R41840 Attention and concentration deficit: Secondary | ICD-10-CM

## 2020-12-13 ENCOUNTER — Other Ambulatory Visit: Payer: Self-pay | Admitting: *Deleted

## 2020-12-13 DIAGNOSIS — R41840 Attention and concentration deficit: Secondary | ICD-10-CM

## 2020-12-18 ENCOUNTER — Telehealth: Payer: Self-pay | Admitting: Neurology

## 2020-12-18 NOTE — Telephone Encounter (Signed)
Patient left vm asking for refills of Adderall and Xanax.  This was denied by Luvenia Starch 12/13/2020 stating patient needs follow up appt.   Please call patient to schedule appt. Thanks! [phone]

## 2020-12-18 NOTE — Telephone Encounter (Signed)
Patient is scheduled for tomorrow with PCP. AM

## 2020-12-19 ENCOUNTER — Other Ambulatory Visit: Payer: Self-pay | Admitting: Physician Assistant

## 2020-12-19 ENCOUNTER — Other Ambulatory Visit: Payer: Self-pay

## 2020-12-19 ENCOUNTER — Ambulatory Visit (INDEPENDENT_AMBULATORY_CARE_PROVIDER_SITE_OTHER): Payer: Self-pay

## 2020-12-19 ENCOUNTER — Ambulatory Visit (INDEPENDENT_AMBULATORY_CARE_PROVIDER_SITE_OTHER): Payer: Self-pay | Admitting: Physician Assistant

## 2020-12-19 ENCOUNTER — Ambulatory Visit: Payer: Self-pay

## 2020-12-19 VITALS — BP 160/81 | HR 80 | Ht 65.0 in | Wt 148.0 lb

## 2020-12-19 DIAGNOSIS — W19XXXA Unspecified fall, initial encounter: Secondary | ICD-10-CM

## 2020-12-19 DIAGNOSIS — Z09 Encounter for follow-up examination after completed treatment for conditions other than malignant neoplasm: Secondary | ICD-10-CM

## 2020-12-19 DIAGNOSIS — M25561 Pain in right knee: Secondary | ICD-10-CM

## 2020-12-19 DIAGNOSIS — R41840 Attention and concentration deficit: Secondary | ICD-10-CM

## 2020-12-19 MED ORDER — AMPHETAMINE-DEXTROAMPHET ER 15 MG PO CP24: 15.0000 mg | ORAL_CAPSULE | ORAL | 0 refills | Status: DC

## 2020-12-19 NOTE — Progress Notes (Signed)
Subjective:    Patient ID: Kimberly Wade, female    DOB: [birth year], 50 y.o.   MRN: [id number]  HPI  Patient is a 50 year old female with inattention and poor concentration, depression, insomnia, anxiety who presents to the clinic for follow-up on medications.  She was started on Adderall 10 mg and she has been doing great.  She does not feel like this has affected her anxiety or insomnia negatively.  In fact she feels like it is helped her mood.  She denies any headaches or palpitations.  She does feel like it tends to run out in the early afternoon and wonders if she could have an increase.  She has not had formal testing.  She states this is scheduled for July.  Patient did fall a little over a week ago.  It was a accidental tripping incident.  She has a small child at home.  She seemed to put most of her weight on her right knee.  It continues to be swollen and painful.  Does feel little stiff.  She does wonder if she fractured anything.  She cannot take NSAIDs due to gastric bypass.  She is already on a pain contract elsewhere.  .. Active Ambulatory Problems    Diagnosis Date Noted  . Insomnia 12/24/2012  . Depression 12/24/2012  . Migraine 12/24/2012  . Hyperlipidemia 12/24/2012  . Vitamin D deficiency 12/24/2012  . Essential hypertension, benign 12/24/2012  . Anxiety 12/24/2012  . S/P gastric bypass 12/24/2012  . Increased PTH level 12/24/2012  . GERD (gastroesophageal reflux disease) 12/24/2012  . Aspiration into lower respiratory tract 05/02/2013  . Primary osteoarthritis of right hand 02/02/2014  . Tension headache 08/08/2014  . Ovarian cyst, right 09/14/2015  . Metrorrhagia 09/18/2015  . Anemia 10/26/2015  . Right trigger finger 05/05/2016  . Low back pain without sciatica 08/08/2016  . Generalized anxiety disorder 08/09/2016  . 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 Overweight (BMI 25.0-29.9) 02/01/2017  .  Acute shoulder bursitis, left 04/03/2017  . Biceps muscle tear, left, subsequent encounter 06/08/2017  . Complete tear of left rotator cuff 06/08/2017  . S/P shoulder surgery 02/12/2018  . Chronic left shoulder pain 02/12/2018  . No energy 02/17/2018  . Biceps rupture, proximal, right, initial encounter 06/03/2018  . Raynaud's disease without gangrene 03/29/2019  . Post concussion syndrome 03/29/2019  . Fall 12/31/2020  . Inattention 12/31/2020  . Poor concentration 12/31/2020   Resolved Ambulatory Problems    Diagnosis Date Noted  . Lingular pneumonia 12/24/2012  . Type 2 diabetes mellitus ([place]) 12/24/2012  . Sinusitis, chronic 12/24/2012  . Diabetic retinopathy ([place]) 12/24/2012  . Right hand pain 05/05/2016  . Acute cystitis 06/18/2016  . Influenza-like illness 08/29/2016  . Acute non-recurrent maxillary sinusitis 10/06/2017   Past Medical History:  Diagnosis Date  . Arthritis   . Chronic pain   . Hypertension   . Pneumococcal pneumonia ([place]) 2000  . Pneumonia      Review of Systems See HPI.     Objective:   Physical Exam Vitals reviewed.  Constitutional:      Appearance: Normal appearance.  HENT:     Head: Normocephalic.  Cardiovascular:     Rate and Rhythm: Normal rate and regular rhythm.  Pulmonary:     Effort: Pulmonary effort is normal.     Breath sounds: Normal breath sounds.  Musculoskeletal:     Right lower leg: No edema.     Left  lower leg: No edema.     Comments: Abrasion to right knee.  Minimal swelling noted superior anterior right knee.  No tenderness in joint spaces.  Tenderness to palpation over patella.  Strength bilateral lower extremities 5/5.  Negative for any knee laxity bilaterally.   Neurological:     General: No focal deficit present.     Mental Status: She is alert and oriented to person, place, and time.  Psychiatric:        Mood and Affect: Mood normal.        Behavior: Behavior normal.           Assessment & Plan:   Marland KitchenMarland KitchenTameshia was seen today for follow-up.  Diagnoses and all orders for this visit:  Fall, initial encounter -     Cancel: DG Knee Bilateral Standing AP; Future  Poor concentration -     amphetamine-dextroamphetamine (ADDERALL XR) 15 MG 24 hr capsule; Take 1 capsule by mouth every morning. -     amphetamine-dextroamphetamine (ADDERALL XR) 15 MG 24 hr capsule; Take 1 capsule by mouth every morning. -     amphetamine-dextroamphetamine (ADDERALL XR) 15 MG 24 hr capsule; Take 1 capsule by mouth every morning.  Inattention -     amphetamine-dextroamphetamine (ADDERALL XR) 15 MG 24 hr capsule; Take 1 capsule by mouth every morning. -     amphetamine-dextroamphetamine (ADDERALL XR) 15 MG 24 hr capsule; Take 1 capsule by mouth every morning. -     amphetamine-dextroamphetamine (ADDERALL XR) 15 MG 24 hr capsule; Take 1 capsule by mouth every morning.    PMDP did not show any results for patient. Sent msg to pharmacist and nurse. Trying to find out if name is not recorded right.  Formal ADHD testing scheduled for July per patient.  She states 10mg  helping a lot but still feels like it is running out before 5-6 at night. Increased to 15mg .  Refilled for 3 months.   Fall, accidental tripping incident. Continues to have some right knee swelling and stiffness. Xray ordered. Ice, diclofenac gel, knee sleeve. Follow up with ortho if symptoms persist.  Patient is on a pain contract elsewhere.

## 2020-12-24 NOTE — Progress Notes (Signed)
Bilateral knee arthritis. No acute injury. Does some a small right knee effusion from fall. Sleep compression, icing, resting, and NSAIDs will help resolve.

## 2020-12-28 ENCOUNTER — Encounter: Payer: Self-pay | Admitting: Neurology

## 2020-12-31 ENCOUNTER — Other Ambulatory Visit: Payer: Self-pay | Admitting: Physician Assistant

## 2020-12-31 ENCOUNTER — Other Ambulatory Visit: Payer: Self-pay | Admitting: Neurology

## 2020-12-31 ENCOUNTER — Encounter: Payer: Self-pay | Admitting: Physician Assistant

## 2020-12-31 DIAGNOSIS — Z20822 Contact with and (suspected) exposure to covid-19: Secondary | ICD-10-CM

## 2020-12-31 DIAGNOSIS — R41840 Attention and concentration deficit: Secondary | ICD-10-CM | POA: Insufficient documentation

## 2020-12-31 DIAGNOSIS — R059 Cough, unspecified: Secondary | ICD-10-CM

## 2020-12-31 DIAGNOSIS — W19XXXA Unspecified fall, initial encounter: Secondary | ICD-10-CM | POA: Insufficient documentation

## 2020-12-31 NOTE — Telephone Encounter (Signed)
Xanax refill requested, RX no longer on med list. Didn't you stop medication because of Percocet/Adderall? Please sign denial if correct.

## 2020-12-31 NOTE — Telephone Encounter (Signed)
Yes she is not on this from my understanding. Also I am not able to see any of her controlled substances via database with issues with her name and having trouble figuring this out.

## 2021-01-03 ENCOUNTER — Encounter: Payer: Self-pay | Admitting: Emergency Medicine

## 2021-01-03 ENCOUNTER — Telehealth: Payer: Self-pay | Admitting: Emergency Medicine

## 2021-01-03 DIAGNOSIS — R059 Cough, unspecified: Secondary | ICD-10-CM

## 2021-01-03 DIAGNOSIS — R519 Headache, unspecified: Secondary | ICD-10-CM

## 2021-01-03 MED ORDER — AZITHROMYCIN 250 MG PO TABS: ORAL_TABLET | ORAL | 0 refills | Status: DC

## 2021-01-03 MED ORDER — DEXTROMETHORPHAN POLISTIREX ER 30 MG/5ML PO SUER: 30.0000 mg | Freq: Two times a day (BID) | ORAL | 0 refills | Status: DC

## 2021-01-03 NOTE — Progress Notes (Signed)
Ms. arieonna, medine are scheduled for a virtual visit with your provider today.    Just as we do with appointments in the office, we must obtain your consent to participate.  Your consent will be active for this visit and any virtual visit you may have with one of our providers in the next 365 days.    If you have a MyChart account, I can also send a copy of this consent to you electronically.  All virtual visits are billed to your insurance company just like a traditional visit in the office.  As this is a virtual visit, video technology does not allow for your provider to perform a traditional examination.  This may limit your provider's ability to fully assess your condition.  If your provider identifies any concerns that need to be evaluated in person or the need to arrange testing such as labs, EKG, etc, we will make arrangements to do so.    Although advances in technology are sophisticated, we cannot ensure that it will always work on either your end or our end.  If the connection with a video visit is poor, we may have to switch to a telephone visit.  With either a video or telephone visit, we are not always able to ensure that we have a secure connection.   I need to obtain your verbal consent now.   Are you willing to proceed with your visit today?   Rosabel Sermeno has provided verbal consent on 01/03/2021 for a virtual visit (video or telephone).   Montine Circle, PA-C 01/03/2021  2:03 PM     Virtual Visit via Video   I connected with patient on 01/03/21 at  2:15 PM EDT by a video enabled telemedicine application and verified that I am speaking with the correct person using two identifiers.  Location patient: Home Location provider: [place] participating in the virtual visit: Patient, Provider  I discussed the limitations of evaluation and management by telemedicine and the availability of in person appointments. The patient expressed understanding  and agreed to proceed.  Subjective:   HPI:   Patient presents via [place] today with a chief complaint of migraine x 2 days.  Has tried taking sumatriptan.  Has been coughing for the same amount of time. States that it doesn't feel like pneumonia.  Has take home COVID test, which was negative.  Cough is productive. Has been febrile to 102.4.  Has tried OTC meds with little relief... did help with fever.  Reports left ear fullness.  ROS:   See pertinent positives and negatives per HPI.  Patient Active Problem List   Diagnosis Date Noted  . Fall 12/31/2020  . Inattention 12/31/2020  . Poor concentration 12/31/2020  . Raynaud's disease without gangrene 03/29/2019  . Post concussion syndrome 03/29/2019  . Biceps rupture, proximal, right, initial encounter 06/03/2018  . No energy 02/17/2018  . S/P shoulder surgery 02/12/2018  . Chronic left shoulder pain 02/12/2018  . Biceps muscle tear, left, subsequent encounter 06/08/2017  . Complete tear of left rotator cuff 06/08/2017  . Acute shoulder bursitis, left 04/03/2017  . Overweight (BMI 25.0-29.9) 02/01/2017  . 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 Generalized anxiety disorder 08/09/2016  . Low back pain without sciatica 08/08/2016  . Right trigger finger 05/05/2016  . Anemia 10/26/2015  . Metrorrhagia 09/18/2015  . Ovarian cyst, right 09/14/2015  . Tension headache 08/08/2014  . Primary osteoarthritis of right  hand 02/02/2014  . Aspiration into lower respiratory tract 05/02/2013  . Insomnia 12/24/2012  . Depression 12/24/2012  . Migraine 12/24/2012  . Hyperlipidemia 12/24/2012  . Vitamin D deficiency 12/24/2012  . Essential hypertension, benign 12/24/2012  . Anxiety 12/24/2012  . S/P gastric bypass 12/24/2012  . Increased PTH level 12/24/2012  . GERD (gastroesophageal reflux disease) 12/24/2012    Social History   Tobacco Use  . Smoking status: Never Smoker  .  Smokeless tobacco: Never Used  Substance Use Topics  . Alcohol use: Yes    Comment: 1 glass per month    Current Outpatient Medications:  .  albuterol (VENTOLIN HFA) 108 (90 Base) MCG/ACT inhaler, INHALE TWO PUFFS BY MOUTH EVERY 6 HOURS AS NEEDED, Disp: 6.7 g, Rfl: 0 .  amphetamine-dextroamphetamine (ADDERALL XR) 15 MG 24 hr capsule, Take 1 capsule by mouth every morning., Disp: 30 capsule, Rfl: 0 .  [START ON 01/19/2021] amphetamine-dextroamphetamine (ADDERALL XR) 15 MG 24 hr capsule, Take 1 capsule by mouth every morning., Disp: 30 capsule, Rfl: 0 .  [START ON 02/18/2021] amphetamine-dextroamphetamine (ADDERALL XR) 15 MG 24 hr capsule, Take 1 capsule by mouth every morning., Disp: 30 capsule, Rfl: 0 .  Biotin 5000 MCG TABS, Take 1 tablet by mouth 2 (two) times daily., Disp: , Rfl:  .  Calcium Citrate-Vitamin D (CALCIUM CITRATE + PO), Take 1 tablet by mouth daily. , Disp: , Rfl:  .  ferrous sulfate 325 (65 FE) MG tablet, Take 1 tablet (325 mg total) by mouth 2 (two) times daily with a meal., Disp: 180 tablet, Rfl: 0 .  gabapentin (NEURONTIN) 300 MG capsule, Take 300 mg by mouth daily. Up to 3 caps daily prn, Disp: , Rfl:  .  hydrochlorothiazide (HYDRODIURIL) 25 MG tablet, TAKE 1 TABLET BY MOUTH DAILY AS NEEDED FOR LOWER EXTREMITY SWELLING, Disp: 90 tablet, Rfl: 1 .  lisinopril-hydrochlorothiazide (ZESTORETIC) 20-25 MG tablet, Take 1 tablet by mouth daily., Disp: , Rfl:  .  Multiple Vitamin (MULTIVITAMIN) tablet, Take 1 tablet by mouth 3 (three) times daily., Disp: , Rfl:  .  naloxone (NARCAN) nasal spray 4 mg/0.1 mL, Place into the nose as needed. (Patient not taking: Reported on 12/19/2020), Disp: , Rfl:  .  ondansetron (ZOFRAN ODT) 8 MG disintegrating tablet, Take 1 tablet (8 mg total) by mouth every 8 (eight) hours as needed for nausea or vomiting., Disp: 20 tablet, Rfl: 0 .  oxyCODONE-acetaminophen (PERCOCET) 10-325 MG tablet, Take 1 tablet by mouth 5 (five) times daily as needed., Disp: , Rfl:   .  potassium chloride SA (KLOR-CON) 20 MEQ tablet, Take 20 mEq by mouth daily., Disp: , Rfl:  .  SUMAtriptan (IMITREX) 50 MG tablet, Take 50 mg by mouth every 2 (two) hours as needed for migraine. May repeat in 2 hours if headache persists or recurs., Disp: , Rfl:  .  traZODone (DESYREL) 50 MG tablet, Take 1 tablet (50 mg total) by mouth at bedtime., Disp: 90 tablet, Rfl: 1 .  venlafaxine XR (EFFEXOR-XR) 150 MG 24 hr capsule, Take one tablet., Disp: 90 capsule, Rfl: 1 .  Vitamin D, Ergocalciferol, (DRISDOL) 1.25 MG (50000 UNIT) CAPS capsule, TAKE 1 CAPSULE BY MOUTH EVERY 7 DAYS/ appt/labs for further refills, Disp: 12 capsule, Rfl: 0  Allergies  Allergen Reactions  . Levofloxacin Hives  . Penicillins Hives  . Levaquin [Levofloxacin In D5w] Hives    Has tolerated avelox    Objective:   There were no vitals taken for this visit.  Patient is  well-developed, well-nourished in no acute distress.  Resting comfortably at home.  Head is normocephalic, atraumatic.  No labored breathing.  Speech is clear and coherent with logical content.  Patient is alert and oriented at baseline.    Assessment and Plan:   1. Cough  -Productive cough with fever, covid negative  - Trial azithromycin and delsym  -PCP f/u if not improving.   Montine Circle, PA-C 01/03/2021

## 2021-01-08 ENCOUNTER — Other Ambulatory Visit: Payer: Self-pay | Admitting: Physician Assistant

## 2021-01-08 DIAGNOSIS — F419 Anxiety disorder, unspecified: Secondary | ICD-10-CM

## 2021-01-08 DIAGNOSIS — F411 Generalized anxiety disorder: Secondary | ICD-10-CM

## 2021-04-08 ENCOUNTER — Other Ambulatory Visit: Payer: Self-pay | Admitting: Physician Assistant

## 2021-04-08 DIAGNOSIS — R41840 Attention and concentration deficit: Secondary | ICD-10-CM

## 2021-04-09 ENCOUNTER — Encounter: Payer: Self-pay | Admitting: Physician Assistant

## 2021-04-09 DIAGNOSIS — R41840 Attention and concentration deficit: Secondary | ICD-10-CM

## 2021-04-11 ENCOUNTER — Other Ambulatory Visit: Payer: Self-pay | Admitting: Physician Assistant

## 2021-04-11 DIAGNOSIS — F330 Major depressive disorder, recurrent, mild: Secondary | ICD-10-CM

## 2021-04-11 DIAGNOSIS — F411 Generalized anxiety disorder: Secondary | ICD-10-CM

## 2021-04-11 MED ORDER — AMPHETAMINE-DEXTROAMPHET ER 15 MG PO CP24: 15.0000 mg | ORAL_CAPSULE | ORAL | 0 refills | Status: DC

## 2021-04-12 ENCOUNTER — Telehealth (INDEPENDENT_AMBULATORY_CARE_PROVIDER_SITE_OTHER): Payer: Self-pay | Admitting: Physician Assistant

## 2021-04-12 VITALS — Temp 98.9°F | Ht 65.0 in | Wt 139.0 lb

## 2021-04-12 DIAGNOSIS — R350 Frequency of micturition: Secondary | ICD-10-CM

## 2021-04-12 DIAGNOSIS — Z20822 Contact with and (suspected) exposure to covid-19: Secondary | ICD-10-CM

## 2021-04-12 DIAGNOSIS — F419 Anxiety disorder, unspecified: Secondary | ICD-10-CM

## 2021-04-12 DIAGNOSIS — R3915 Urgency of urination: Secondary | ICD-10-CM

## 2021-04-12 DIAGNOSIS — F330 Major depressive disorder, recurrent, mild: Secondary | ICD-10-CM

## 2021-04-12 DIAGNOSIS — R41840 Attention and concentration deficit: Secondary | ICD-10-CM

## 2021-04-12 DIAGNOSIS — R300 Dysuria: Secondary | ICD-10-CM

## 2021-04-12 MED ORDER — VENLAFAXINE HCL ER 150 MG PO CP24: ORAL_CAPSULE | ORAL | 1 refills | Status: DC

## 2021-04-12 MED ORDER — VENLAFAXINE HCL ER 75 MG PO CP24: 75.0000 mg | ORAL_CAPSULE | Freq: Every day | ORAL | 1 refills | Status: DC

## 2021-04-12 MED ORDER — AMPHETAMINE-DEXTROAMPHET ER 15 MG PO CP24: 15.0000 mg | ORAL_CAPSULE | ORAL | 0 refills | Status: DC

## 2021-04-12 MED ORDER — NITROFURANTOIN MONOHYD MACRO 100 MG PO CAPS: 100.0000 mg | ORAL_CAPSULE | Freq: Two times a day (BID) | ORAL | 0 refills | Status: DC

## 2021-04-12 NOTE — Progress Notes (Signed)
Possible UTI - exposed to Covid on Tuesday, no current symptoms Little urine output Frequent urination Burning with urination Small amount of blood couple days ago  No abdominal or back pain  No new OTC meds  Just had a referral of Adderall, good for this month, pended for next two months

## 2021-04-12 NOTE — Progress Notes (Signed)
..Virtual Visit via Video Note  I connected with Kimberly Wade on 04/16/21 at  2:00 PM EDT by a video enabled telemedicine application and verified that I am speaking with the correct person using two identifiers.  Location: Patient: home Provider: clinic  .Marland KitchenParticipating in visit:  Patient: Kimberly Wade Provider: Iran Planas PA-C    I discussed the limitations of evaluation and management by telemedicine and the availability of in person appointments. The patient expressed understanding and agreed to proceed.  History of Present Illness: Patient is a 50 year old female with depression and anxiety who presents to the clinic for refills.  Patient has been started on Adderall in combination with Effexor for her poor concentration and inattention. She feels like adderall is really helping and been a great addition to her therapy. She does feel like effexor needs to be increased. No SI/HC. She has not been formally tested for ADD due to scheduling.   Patient does report some urinary frequency, dysuria, urgency for the last week.  She did have sexual experience where there was a lot of foreplay and not sure if her partner's hands were clean.  Her symptoms started shortly after this.  She denies any nausea, vomiting, flank pain, abdominal pain, discharge.  Patient not able to come to the office today due to [place] exposure.  .. Active Ambulatory Problems    Diagnosis Date Noted   Insomnia 12/24/2012   Depression 12/24/2012   Migraine 12/24/2012   Hyperlipidemia 12/24/2012   Vitamin D deficiency 12/24/2012   Essential hypertension, benign 12/24/2012   Anxiety 12/24/2012   S/P gastric bypass 12/24/2012   Increased PTH level 12/24/2012   GERD (gastroesophageal reflux disease) 12/24/2012   Aspiration into lower respiratory tract 05/02/2013   Primary osteoarthritis of right hand 02/02/2014   Tension headache 08/08/2014   Ovarian cyst, right 09/14/2015   Metrorrhagia 09/18/2015   Anemia  10/26/2015   Right trigger finger 05/05/2016   Low back pain without sciatica 08/08/2016   Generalized anxiety disorder 08/09/2016   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 01/30/2017   Overweight (BMI 25.0-29.9) 02/01/2017   Acute shoulder bursitis, left 04/03/2017   Biceps muscle tear, left, subsequent encounter 06/08/2017   Complete tear of left rotator cuff 06/08/2017   S/P shoulder surgery 02/12/2018   Chronic left shoulder pain 02/12/2018   No energy 02/17/2018   Biceps rupture, proximal, right, initial encounter 06/03/2018   Raynaud's disease without gangrene 03/29/2019   Post concussion syndrome 03/29/2019   Fall 12/31/2020   Inattention 12/31/2020   Poor concentration 12/31/2020   Resolved Ambulatory Problems    Diagnosis Date Noted   Lingular pneumonia 12/24/2012   Type 2 diabetes mellitus ([place]) 12/24/2012   Sinusitis, chronic 12/24/2012   Diabetic retinopathy ([place]) 12/24/2012   Right hand pain 05/05/2016   Acute cystitis 06/18/2016   Influenza-like illness 08/29/2016   Acute non-recurrent maxillary sinusitis 10/06/2017   Past Medical History:  Diagnosis Date   Arthritis    Chronic pain    Hypertension    Pneumococcal pneumonia ([place]) 2000   Pneumonia        Observations/Objective: No acute distress Normal breathing Normal mood and appearance  .[name] Today's Vitals   04/12/21 1402  Temp: 98.9 F (37.2 C)  TempSrc: Oral  Weight: 139 lb (63 kg)  Height: '5\' 5"'$  (1.651 m)   Body mass index is 23.13 kg/m.  .. Depression screen [facility] 2/9 12/19/2020 09/10/2020 06/20/2019 03/25/2019 12/10/2017  Decreased Interest 1 1  $'3 2 1  'h$ Down, Depressed, Hopeless '1 1 3 1 1  '$ PHQ - 2 Score '2 2 6 3 2  '$ Altered sleeping '2 1 2 3 1  '$ Tired, decreased energy '3 1 3 3 2  '$ Change in appetite '1 1 1 2 '$ 0  Feeling bad or failure about yourself  1 0 1 0 0  Trouble concentrating '3 3 3 3 1  '$ Moving slowly or fidgety/restless 3 3 0 2 0  Suicidal thoughts  0 0 0 0 0  PHQ-9 Score '15 11 16 16 6  '$ Difficult doing work/chores Very difficult Very difficult Extremely dIfficult Extremely dIfficult Not difficult at all   .[name] GAD 7 : Generalized Anxiety Score 12/19/2020 09/10/2020 06/20/2019 03/25/2019  Nervous, Anxious, on Edge 2 0 1 2  Control/stop worrying 1 0 2 2  Worry too much - different things 1 0 2 2  Trouble relaxing '2 2 3 3  '$ Restless '2 3 1 2  '$ Easily annoyed or irritable '2 1 3 2  '$ Afraid - awful might happen 1 0 1 0  Total GAD 7 Score '11 6 13 13  '$ Anxiety Difficulty Very difficult Very difficult Extremely difficult Extremely difficult       Assessment and Plan: Marland KitchenMarland KitchenThandi was seen today for adhd.  Diagnoses and all orders for this visit:  Poor concentration -     amphetamine-dextroamphetamine (ADDERALL XR) 15 MG 24 hr capsule; Take 1 capsule by mouth every morning. -     amphetamine-dextroamphetamine (ADDERALL XR) 15 MG 24 hr capsule; Take 1 capsule by mouth every morning. -     Ambulatory referral to Psychology  Inattention -     amphetamine-dextroamphetamine (ADDERALL XR) 15 MG 24 hr capsule; Take 1 capsule by mouth every morning. -     amphetamine-dextroamphetamine (ADDERALL XR) 15 MG 24 hr capsule; Take 1 capsule by mouth every morning. -     Ambulatory referral to Psychology  Mild episode of recurrent major depressive disorder (HCC) -     venlafaxine XR (EFFEXOR-XR) 150 MG 24 hr capsule; Take one tablet. -     venlafaxine XR (EFFEXOR XR) 75 MG 24 hr capsule; Take 1 capsule (75 mg total) by mouth daily with breakfast. -     Ambulatory referral to Psychology  Dysuria -     nitrofurantoin, macrocrystal-monohydrate, (MACROBID) 100 MG capsule; Take 1 capsule (100 mg total) by mouth 2 (two) times daily. For 5 days.  Urinary frequency -     nitrofurantoin, macrocrystal-monohydrate, (MACROBID) 100 MG capsule; Take 1 capsule (100 mg total) by mouth 2 (two) times daily. For 5 days.  Urinary urgency -     nitrofurantoin,  macrocrystal-monohydrate, (MACROBID) 100 MG capsule; Take 1 capsule (100 mg total) by mouth 2 (two) times daily. For 5 days.  Exposure to COVID-19 virus  Anxiety -     venlafaxine XR (EFFEXOR-XR) 150 MG 24 hr capsule; Take one tablet. -     venlafaxine XR (EFFEXOR XR) 75 MG 24 hr capsule; Take 1 capsule (75 mg total) by mouth daily with breakfast.  No symptoms yet of covid. Discussed what to look. She does not have quarantine unless symptomatic.   Reported benefit from Adderall. She has not had formal testing. Explained that needed to be done with her concurrent anxiety and depression. Another referral placed.  PHQ/GAD numbers not to goal.  Refilled effexor and increased dose. Refilled adderall.  Follow up in 3 months.   Empirically treated for UTI due to symptoms and history of  sexual encounter that could have caused the infection. Macrobid sent. If continues to have any symptoms must be seen in person.    Follow Up Instructions:    I discussed the assessment and treatment plan with the patient. The patient was provided an opportunity to ask questions and all were answered. The patient agreed with the plan and demonstrated an understanding of the instructions.   The patient was advised to call back or seek an in-person evaluation if the symptoms worsen or if the condition fails to improve as anticipated.    Iran Planas, PA-C

## 2021-04-16 ENCOUNTER — Encounter: Payer: Self-pay | Admitting: Physician Assistant

## 2021-05-06 ENCOUNTER — Encounter: Payer: Self-pay | Admitting: Physician Assistant

## 2021-05-08 ENCOUNTER — Telehealth (INDEPENDENT_AMBULATORY_CARE_PROVIDER_SITE_OTHER): Payer: Self-pay | Admitting: Physician Assistant

## 2021-05-08 VITALS — Ht 65.0 in | Wt 139.0 lb

## 2021-05-08 DIAGNOSIS — M25561 Pain in right knee: Secondary | ICD-10-CM

## 2021-05-08 DIAGNOSIS — G8929 Other chronic pain: Secondary | ICD-10-CM

## 2021-05-08 DIAGNOSIS — M25511 Pain in right shoulder: Secondary | ICD-10-CM

## 2021-05-08 DIAGNOSIS — M130 Polyarthritis, unspecified: Secondary | ICD-10-CM

## 2021-05-08 DIAGNOSIS — M25562 Pain in left knee: Secondary | ICD-10-CM

## 2021-05-08 DIAGNOSIS — M25512 Pain in left shoulder: Secondary | ICD-10-CM

## 2021-05-08 MED ORDER — OXYCODONE-ACETAMINOPHEN 10-325 MG PO TABS: 1.0000 | ORAL_TABLET | Freq: Every day | ORAL | 0 refills | Status: DC | PRN

## 2021-05-08 NOTE — Progress Notes (Signed)
Discuss pain medication/referral to another pain clinic Patient wrote mychart message

## 2021-05-08 NOTE — Progress Notes (Signed)
Patient ID: Kimberly Wade, female   DOB: 11/29/1970, 50 y.o.   MRN: [id number] .Marland KitchenVirtual Visit via Telephone Note  I connected with Kimberly Wade on 05/10/21 at 11:10 AM EDT by telephone and verified that I am speaking with the correct person using two identifiers.  Location: Patient: home Provider: clinic  .Marland KitchenParticipating in visit:  Patient: Kimberly Wade Provider: Iran Planas PA-C   I discussed the limitations, risks, security and privacy concerns of performing an evaluation and management service by telephone and the availability of in person appointments. I also discussed with the patient that there may be a patient responsible charge related to this service. The patient expressed understanding and agreed to proceed.   History of Present Illness: Pt is a 50 yo female who calls into the clinic to get bridge of oxycodone since she is completely out and having URI symptoms along with withdrawal symptoms.   She has been out for 4 days because she cannot afford copay with pain clinic this month. She is requesting refill.   Name she is registred until   [birth year] Kimberly Wade Kimberly Wade at [employer].   .. Active Ambulatory Problems    Diagnosis Date Noted   Insomnia 12/24/2012   Depression 12/24/2012   Migraine 12/24/2012   Hyperlipidemia 12/24/2012   Vitamin D deficiency 12/24/2012   Essential hypertension, benign 12/24/2012   Anxiety 12/24/2012   S/P gastric bypass 12/24/2012   Increased PTH level 12/24/2012   GERD (gastroesophageal reflux disease) 12/24/2012   Aspiration into lower respiratory tract 05/02/2013   Primary osteoarthritis of right hand 02/02/2014   Tension headache 08/08/2014   Ovarian cyst, right 09/14/2015   Metrorrhagia 09/18/2015   Anemia 10/26/2015   Right trigger finger 05/05/2016   Low back pain without sciatica 08/08/2016   Generalized anxiety disorder 08/09/2016   Closed nondisplaced fracture of proximal phalanx of right great toe  with nonunion 01/30/2017   Overweight (BMI 25.0-29.9) 02/01/2017   Acute shoulder bursitis, left 04/03/2017   Biceps muscle tear, left, subsequent encounter 06/08/2017   Complete tear of left rotator cuff 06/08/2017   S/P shoulder surgery 02/12/2018   Chronic left shoulder pain 02/12/2018   No energy 02/17/2018   Biceps rupture, proximal, right, initial encounter 06/03/2018   Raynaud's disease without gangrene 03/29/2019   Post concussion syndrome 03/29/2019   Fall 12/31/2020   Inattention 12/31/2020   Poor concentration 12/31/2020   Chronic pain of both shoulders 05/10/2021   Chronic pain of both knees 05/10/2021   Polyarthritis 05/10/2021   Resolved Ambulatory Problems    Diagnosis Date Noted   Lingular pneumonia 12/24/2012   Type 2 diabetes mellitus ([place]) 12/24/2012   Sinusitis, chronic 12/24/2012   Diabetic retinopathy ([place]) 12/24/2012   Right hand pain 05/05/2016   Acute cystitis 06/18/2016   Influenza-like illness 08/29/2016   Acute non-recurrent maxillary sinusitis 10/06/2017   Past Medical History:  Diagnosis Date   Arthritis    Chronic pain    Hypertension    Pneumococcal pneumonia ([place]) 2000   Pneumonia        Observations/Objective: Anxious URI symptoms noted.   Assessment and Plan: Marland KitchenMarland KitchenDeshanna was seen today for follow-up.  Diagnoses and all orders for this visit:  Chronic pain of both shoulders -     oxyCODONE-acetaminophen (PERCOCET) 10-325 MG tablet; Take 1 tablet by mouth 5 (five) times daily as needed.  Chronic pain of both knees -     oxyCODONE-acetaminophen (PERCOCET) 10-325 MG tablet; Take 1 tablet by mouth 5 (five)  times daily as needed.  Polyarthritis -     oxyCODONE-acetaminophen (PERCOCET) 10-325 MG tablet; Take 1 tablet by mouth 5 (five) times daily as needed.  Called pain clinic and confirmed dosage given and not being able to afford co-pay this month.  Called pharmacy to confirm she has no recent fills.  Not able to pull her up in  pmdp database.  Discussed I will not continue to prescript those high dosages of oxycodone.  I will bridge for 1 month.  Get back in with pain clinic.    Follow Up Instructions:    I discussed the assessment and treatment plan with the patient. The patient was provided an opportunity to ask questions and all were answered. The patient agreed with the plan and demonstrated an understanding of the instructions.   The patient was advised to call back or seek an in-person evaluation if the symptoms worsen or if the condition fails to improve as anticipated.  I provided 20 minutes of non-face-to-face time during this encounter.   Iran Planas, PA-C

## 2021-05-10 ENCOUNTER — Encounter: Payer: Self-pay | Admitting: Physician Assistant

## 2021-05-10 DIAGNOSIS — M130 Polyarthritis, unspecified: Secondary | ICD-10-CM | POA: Insufficient documentation

## 2021-05-10 DIAGNOSIS — M25512 Pain in left shoulder: Secondary | ICD-10-CM | POA: Insufficient documentation

## 2021-05-10 DIAGNOSIS — G8929 Other chronic pain: Secondary | ICD-10-CM | POA: Insufficient documentation

## 2021-05-16 ENCOUNTER — Other Ambulatory Visit: Payer: Self-pay | Admitting: Physician Assistant

## 2021-05-16 DIAGNOSIS — I10 Essential (primary) hypertension: Secondary | ICD-10-CM

## 2021-05-17 ENCOUNTER — Encounter: Payer: Self-pay | Admitting: Physician Assistant

## 2021-05-17 ENCOUNTER — Other Ambulatory Visit: Payer: Self-pay | Admitting: Physician Assistant

## 2021-05-18 ENCOUNTER — Other Ambulatory Visit: Payer: Self-pay | Admitting: Physician Assistant

## 2021-05-18 DIAGNOSIS — R519 Headache, unspecified: Secondary | ICD-10-CM

## 2021-05-21 ENCOUNTER — Other Ambulatory Visit: Payer: Self-pay

## 2021-05-21 DIAGNOSIS — R519 Headache, unspecified: Secondary | ICD-10-CM

## 2021-05-21 DIAGNOSIS — I10 Essential (primary) hypertension: Secondary | ICD-10-CM

## 2021-05-21 MED ORDER — SUMATRIPTAN SUCCINATE 50 MG PO TABS: 50.0000 mg | ORAL_TABLET | ORAL | 1 refills | Status: DC | PRN

## 2021-05-21 MED ORDER — LISINOPRIL-HYDROCHLOROTHIAZIDE 20-25 MG PO TABS: 1.0000 | ORAL_TABLET | Freq: Every day | ORAL | 0 refills | Status: DC

## 2021-05-21 MED ORDER — ONDANSETRON 8 MG PO TBDP: 8.0000 mg | ORAL_TABLET | Freq: Three times a day (TID) | ORAL | 0 refills | Status: DC | PRN

## 2021-07-05 ENCOUNTER — Encounter: Payer: Self-pay | Admitting: Physician Assistant

## 2021-07-05 ENCOUNTER — Other Ambulatory Visit: Payer: Self-pay | Admitting: Physician Assistant

## 2021-07-05 DIAGNOSIS — R41840 Attention and concentration deficit: Secondary | ICD-10-CM

## 2021-07-08 ENCOUNTER — Encounter: Payer: Self-pay | Admitting: Physician Assistant

## 2021-07-08 DIAGNOSIS — R41840 Attention and concentration deficit: Secondary | ICD-10-CM

## 2021-07-09 MED ORDER — AMPHETAMINE-DEXTROAMPHET ER 15 MG PO CP24: 15.0000 mg | ORAL_CAPSULE | ORAL | 0 refills | Status: DC

## 2021-07-09 NOTE — Telephone Encounter (Signed)
Sent!

## 2021-07-13 ENCOUNTER — Ambulatory Visit: Payer: Self-pay

## 2021-07-13 ENCOUNTER — Telehealth: Payer: Self-pay | Admitting: Emergency Medicine

## 2021-07-13 NOTE — Telephone Encounter (Signed)
Call to [facility] to review her knee injury per provider's request to determine if [facility] would be a better option at this time for her today. RN left message asking pt to call back

## 2021-07-30 ENCOUNTER — Encounter: Payer: Self-pay | Admitting: Physician Assistant

## 2021-07-30 DIAGNOSIS — F419 Anxiety disorder, unspecified: Secondary | ICD-10-CM

## 2021-07-31 MED ORDER — BUSPIRONE HCL 7.5 MG PO TABS
7.5000 mg | ORAL_TABLET | Freq: Three times a day (TID) | ORAL | 3 refills | Status: DC
Start: 2021-07-31 — End: 2021-12-17

## 2021-08-05 ENCOUNTER — Other Ambulatory Visit: Payer: Self-pay | Admitting: Physician Assistant

## 2021-08-05 ENCOUNTER — Encounter: Payer: Self-pay | Admitting: Physician Assistant

## 2021-08-05 DIAGNOSIS — R41840 Attention and concentration deficit: Secondary | ICD-10-CM

## 2021-08-06 MED ORDER — AMPHETAMINE-DEXTROAMPHET ER 15 MG PO CP24: 15.0000 mg | ORAL_CAPSULE | ORAL | 0 refills | Status: DC

## 2021-08-06 MED ORDER — GABAPENTIN 300 MG PO CAPS: 300.0000 mg | ORAL_CAPSULE | Freq: Every day | ORAL | 0 refills | Status: DC

## 2021-08-06 NOTE — Telephone Encounter (Signed)
Refill request sent to [facility].

## 2021-08-07 ENCOUNTER — Encounter: Payer: Self-pay | Admitting: Physician Assistant

## 2021-08-08 ENCOUNTER — Telehealth (INDEPENDENT_AMBULATORY_CARE_PROVIDER_SITE_OTHER): Payer: Commercial Managed Care - PPO | Admitting: Family Medicine

## 2021-08-08 ENCOUNTER — Encounter: Payer: Self-pay | Admitting: Family Medicine

## 2021-08-08 VITALS — Ht 66.0 in | Wt 127.0 lb

## 2021-08-08 DIAGNOSIS — Z20822 Contact with and (suspected) exposure to covid-19: Secondary | ICD-10-CM

## 2021-08-08 DIAGNOSIS — J101 Influenza due to other identified influenza virus with other respiratory manifestations: Secondary | ICD-10-CM | POA: Diagnosis not present

## 2021-08-08 DIAGNOSIS — J069 Acute upper respiratory infection, unspecified: Secondary | ICD-10-CM | POA: Diagnosis not present

## 2021-08-08 DIAGNOSIS — R059 Cough, unspecified: Secondary | ICD-10-CM

## 2021-08-08 LAB — POCT INFLUENZA A/B
Influenza A, POC: POSITIVE — AB
Influenza B, POC: NEGATIVE

## 2021-08-08 MED ORDER — OSELTAMIVIR PHOSPHATE 75 MG PO CAPS: 75.0000 mg | ORAL_CAPSULE | Freq: Two times a day (BID) | ORAL | 0 refills | Status: DC

## 2021-08-08 MED ORDER — HYDROCODONE BIT-HOMATROP MBR 5-1.5 MG/5ML PO SOLN
5.0000 mL | Freq: Three times a day (TID) | ORAL | 0 refills | Status: AC | PRN
Start: 2021-08-08 — End: 2021-08-13

## 2021-08-08 MED ORDER — ALBUTEROL SULFATE HFA 108 (90 BASE) MCG/ACT IN AERS: INHALATION_SPRAY | RESPIRATORY_TRACT | 0 refills | Status: DC

## 2021-08-08 NOTE — Progress Notes (Signed)
Virtual Video Visit via MyChart Note - converted to telephone call  I connected with  Kimberly Wade on 08/08/21 at  3:00 PM EST by the video enabled telemedicine application for MyChart, and verified that I am speaking with the correct person using two identifiers.   I introduced myself as a Designer, jewellery with the practice. We discussed the limitations of evaluation and management by telemedicine and the availability of in person appointments. The patient expressed understanding and agreed to proceed.  Participating parties in this visit include: The patient and the nurse practitioner listed.  The patient is: At home I am: In the office - [place]  Subjective:    CC:  Chief Complaint  Patient presents with   Fatigue   Sore Throat    Taking tylenol as needed.    Nasal Congestion    HPI: Kimberly Wade is a 50 y.o. year old female presenting today via [place] today for flu-like symptoms.  Patient reports she started "feeling yucky" 2 days ago - that same day her husband tested positive for Flu A. Yesterday she began feeling worst with productive cough (thick green sputum), generalized body aches, fatigue, frontal sinus pressure, headache, bilateral ear pain, occasional shortness of breath (hasn't needed to use inhaler), and fever 101-104.33F. She is also nauseous and has vomited once. States she had some right lung pain yesterday that made her nervous because she has a history of pneumonia.   She is already outside out office hoping to get swabbed for Flu and COVID. She would like antiviral therapy if positive.      Past medical history, Surgical history, Family history not pertinant except as noted below, Social history, Allergies, and medications have been entered into the medical record, reviewed, and corrections made.   Review of Systems:  All review of systems negative except what is listed in the HPI   Objective:    General:  Speaking clearly  in complete sentences. Absent shortness of breath noted.   Alert and oriented x3.   Normal judgment.  Absent acute distress.   Impression and Recommendations:      1. Viral URI with cough Flu test positive  COVID pending  -Sending in Tamiflu  -Continue supportive measures including rest, hydration, humidifier use, steam showers, warm compresses to sinuses, warm liquids with lemon and honey, and over-the-counter cough, cold, and analgesics as needed.   -Patient aware of signs/symptoms requiring further/urgent evaluation.    - albuterol (VENTOLIN HFA) 108 (90 Base) MCG/ACT inhaler; INHALE TWO PUFFS BY MOUTH EVERY 6 HOURS AS NEEDED  Dispense: 6.7 g; Refill: 0 - HYDROcodone bit-homatropine (HYCODAN) 5-1.5 MG/5ML syrup; Take 5 mLs by mouth every 8 (eight) hours as needed for up to 5 days for cough.  Dispense: 75 mL; Refill: 0     Follow-up if symptoms worsen or fail to improve.    I discussed the assessment and treatment plan with the patient. The patient was provided an opportunity to ask questions and all were answered. The patient agreed with the plan and demonstrated an understanding of the instructions.   The patient was advised to call back or seek an in-person evaluation if the symptoms worsen or if the condition fails to improve as anticipated.  I spent 20 minutes dedicated to the care of this patient on the date of this encounter to include pre-visit chart review of prior notes and results, face-to-face time with the patient, and post-visit ordering of testing as indicated.   Kimberly Saver,  NP

## 2021-08-08 NOTE — Patient Instructions (Signed)
Flu test positive  COVID pending  -Sending in Tamiflu  -Continue supportive measures including rest, hydration, humidifier use, steam showers, warm compresses to sinuses, warm liquids with lemon and honey, and over-the-counter cough, cold, and analgesics as needed.

## 2021-08-09 LAB — SARS-COV-2, NAA 2 DAY TAT

## 2021-08-09 LAB — NOVEL CORONAVIRUS, NAA: SARS-CoV-2, NAA: NOT DETECTED

## 2021-08-22 ENCOUNTER — Encounter: Payer: Self-pay | Admitting: Medical-Surgical

## 2021-08-22 ENCOUNTER — Other Ambulatory Visit: Payer: Self-pay

## 2021-08-22 ENCOUNTER — Telehealth (INDEPENDENT_AMBULATORY_CARE_PROVIDER_SITE_OTHER): Payer: Commercial Managed Care - PPO | Admitting: Medical-Surgical

## 2021-08-22 DIAGNOSIS — R0781 Pleurodynia: Secondary | ICD-10-CM

## 2021-08-22 DIAGNOSIS — R051 Acute cough: Secondary | ICD-10-CM

## 2021-08-22 MED ORDER — HYDROCODONE BIT-HOMATROP MBR 5-1.5 MG/5ML PO SOLN: 5.0000 mL | Freq: Three times a day (TID) | ORAL | 0 refills | Status: DC | PRN

## 2021-08-22 MED ORDER — PREDNISONE 50 MG PO TABS: 50.0000 mg | ORAL_TABLET | Freq: Every day | ORAL | 0 refills | Status: DC

## 2021-08-22 NOTE — Progress Notes (Signed)
Virtual Visit via Video Note  I connected with Kimberly Wade on 08/22/21 at 11:10 AM EST by a video enabled telemedicine application and verified that I am speaking with the correct person using two identifiers.   I discussed the limitations of evaluation and management by telemedicine and the availability of in person appointments. The patient expressed understanding and agreed to proceed.  Patient location: home Provider locations: office  Subjective:    CC: Cough, chest congestion  HPI: Very pleasant 51 year old female presenting via [place] video visit to discuss pleuritic chest pain.  Notes that she had flu at the end of December just before Christmas and had a typical course of her symptoms.  Yesterday, she started to experience pain just under her left breast with deep inspiration, coughing, and yawning.  She has not found anything to make this pain better.  She is very worried because she had a similar pain when she had pneumonia before.  No other associated symptoms at this point including fever, chills, shortness of breath, and upper respiratory issues.  Describes the pain as sharp and stabbing.  Past medical history, Surgical history, Family history not pertinant except as noted below, Social history, Allergies, and medications have been entered into the medical record, reviewed, and corrections made.   Review of Systems: See HPI for pertinent positives and negatives.   Objective:    General: Speaking clearly in complete sentences without any shortness of breath.  Alert and oriented x3.  Normal judgment. No apparent acute distress.  Impression and Recommendations:    1. Pleuritic chest pain 2. Acute cough Recommend chest x-ray today.  Advised that the order has been placed and she can go downstairs to our imaging department at her earliest convenience.  We will go ahead and treat with prednisone 50 mg daily for 5 days and Hycodan cough syrup as needed.  Pending imaging  results to determine if antibiotics are indicated. - DG Chest 2 View; Future  I discussed the assessment and treatment plan with the patient. The patient was provided an opportunity to ask questions and all were answered. The patient agreed with the plan and demonstrated an understanding of the instructions.   The patient was advised to call back or seek an in-person evaluation if the symptoms worsen or if the condition fails to improve as anticipated.  25 minutes of non-face-to-face time was provided during this encounter.  Return if symptoms worsen or fail to improve.  Clearnce Sorrel, DNP, APRN, FNP-BC [facility]

## 2021-08-25 ENCOUNTER — Encounter: Payer: Self-pay | Admitting: Medical-Surgical

## 2021-08-25 ENCOUNTER — Encounter: Payer: Self-pay | Admitting: Physician Assistant

## 2021-08-28 ENCOUNTER — Other Ambulatory Visit: Payer: Self-pay

## 2021-08-28 ENCOUNTER — Ambulatory Visit (INDEPENDENT_AMBULATORY_CARE_PROVIDER_SITE_OTHER): Payer: Commercial Managed Care - PPO | Admitting: Physician Assistant

## 2021-08-28 ENCOUNTER — Encounter: Payer: Self-pay | Admitting: Physician Assistant

## 2021-08-28 ENCOUNTER — Ambulatory Visit (INDEPENDENT_AMBULATORY_CARE_PROVIDER_SITE_OTHER): Payer: Commercial Managed Care - PPO

## 2021-08-28 VITALS — BP 139/70 | HR 105 | Ht 66.0 in | Wt 137.1 lb

## 2021-08-28 DIAGNOSIS — R0602 Shortness of breath: Secondary | ICD-10-CM | POA: Diagnosis not present

## 2021-08-28 DIAGNOSIS — J189 Pneumonia, unspecified organism: Secondary | ICD-10-CM

## 2021-08-28 DIAGNOSIS — I2699 Other pulmonary embolism without acute cor pulmonale: Secondary | ICD-10-CM | POA: Diagnosis not present

## 2021-08-28 DIAGNOSIS — R079 Chest pain, unspecified: Secondary | ICD-10-CM

## 2021-08-28 DIAGNOSIS — J90 Pleural effusion, not elsewhere classified: Secondary | ICD-10-CM

## 2021-08-28 MED ORDER — PREDNISONE 50 MG PO TABS: ORAL_TABLET | ORAL | 0 refills | Status: DC

## 2021-08-28 MED ORDER — HYDROCODONE BIT-HOMATROP MBR 5-1.5 MG/5ML PO SOLN: 5.0000 mL | Freq: Three times a day (TID) | ORAL | 0 refills | Status: DC | PRN

## 2021-08-28 MED ORDER — METHYLPREDNISOLONE SODIUM SUCC 125 MG IJ SOLR
125.0000 mg | Freq: Once | INTRAMUSCULAR | Status: AC
  Administered 2021-08-28: 125 mg via INTRAMUSCULAR

## 2021-08-28 MED ORDER — FUROSEMIDE 20 MG PO TABS: 20.0000 mg | ORAL_TABLET | Freq: Every day | ORAL | 0 refills | Status: DC

## 2021-08-28 MED ORDER — KETOROLAC TROMETHAMINE 60 MG/2ML IM SOLN
60.0000 mg | Freq: Once | INTRAMUSCULAR | Status: AC
  Administered 2021-08-28: 60 mg via INTRAMUSCULAR

## 2021-08-28 NOTE — Progress Notes (Signed)
You do have a small amt of fluid but no pneumonia in lung. The fluid is likely due to the blood clot and pneumonia resolving. If SOB getting worse let me know. I will also send over a diuretic to start for the the next 5 days to help get the fluid off. Follow up in office on Monday to recheck all of this.  Hycodan can not be refilled so soon because of the oxy and other medications.

## 2021-08-28 NOTE — Patient Instructions (Addendum)
Get CXR/Labs Start prednisone tomorrow for 5 days Rest/ice and hydrate  Costochondritis Costochondritis  is inflammation of the tissue (cartilage) that connects the ribs to the breastbone (sternum). This causes pain in the front of the chest. The pain usually starts slowly and involves more than one rib. What are the causes? The exact cause of this condition is not always known. It results from stress on the cartilage where your ribs attach to your sternum. The cause of this stress could be: Chest injury. Exercise or activity, such as lifting. Severe coughing. What increases the risk? You are more likely to develop this condition if you: Are female. Are 51-41 years old. Recently started a new exercise or work activity. Have low levels of vitamin D. Have a condition that makes you cough frequently. What are the signs or symptoms? The main symptom of this condition is chest pain. The pain: Usually starts gradually and can be sharp or dull. Gets worse with deep breathing, coughing, or exercise. Gets better with rest. May be worse when you press on the affected area of your ribs and sternum. How is this diagnosed? This condition is diagnosed based on your symptoms, your medical history, and a physical exam. Your health care provider will check for pain when pressing on your sternum. You may also have tests to rule out other causes of chest pain. These may include: A chest X-ray to check for lung problems. An ECG (electrocardiogram) to see if you have a heart problem that could be causing the pain. An imaging scan to rule out a chest or rib fracture. How is this treated? This condition usually goes away on its own over time. Your health care provider may prescribe an NSAID, such as ibuprofen, to reduce pain and inflammation. Treatment may also include: Resting and avoiding activities that make pain worse. Applying heat or ice to the area to reduce pain and inflammation. Doing exercises  to stretch your chest muscles. If these treatments do not help, your health care provider may inject a numbing medicine at the sternum-rib connection to help relieve the pain. Follow these instructions at home: Managing pain, stiffness, and swelling   If directed, put ice on the painful area. To do this: Put ice in a plastic bag. Place a towel between your skin and the bag. Leave the ice on for 20 minutes, 2-3 times a day. If directed, apply heat to the affected area as often as told by your health care provider. Use the heat source that your health care provider recommends, such as a moist heat pack or a heating pad. Place a towel between your skin and the heat source. Leave the heat on for 20-30 minutes. Remove the heat if your skin turns bright red. This is especially important if you are unable to feel pain, heat, or cold. You may have a greater risk of getting burned. Activity Rest as told by your health care provider. Avoid activities that make pain worse. This includes any activities that use chest, abdominal, and side muscles. Do not lift anything that is heavier than 10 lb (4.5 kg), or the limit that you are told, until your health care provider says that it is safe. Return to your normal activities as told by your health care provider. Ask your health care provider what activities are safe for you. General instructions Take over-the-counter and prescription medicines only as told by your health care provider. Keep all follow-up visits as told by your health care provider. This is  important. Contact a health care provider if: You have chills or a fever. Your pain does not go away or it gets worse. You have a cough that does not go away. Get help right away if: You have shortness of breath. You have severe chest pain that is not relieved by medicines, heat, or ice. These symptoms may represent a serious problem that is an emergency. Do not wait to see if the symptoms will go away.  Get medical help right away. Call your local emergency services (911 in the [place] Do not drive yourself to the hospital.  Summary Costochondritis is inflammation of the tissue (cartilage) that connects the ribs to the breastbone (sternum). This condition causes pain in the front of the chest. Costochondritis results from stress on the cartilage where your ribs attach to your sternum. Treatment may include medicines, rest, heat or ice, and exercises. This information is not intended to replace advice given to you by your health care provider. Make sure you discuss any questions you have with your health care provider. Document Revised: 06/17/2019 Document Reviewed: 06/17/2019 Elsevier Patient Education  2022 [employer].

## 2021-08-28 NOTE — Progress Notes (Signed)
Subjective:    Patient ID: Kimberly Wade, female    DOB: 1971/07/19, 51 y.o.   MRN: [id number]  HPI Pt is a 51 yo female who presents to the clinic for hospital follow up. On 12/21 pt was dx with influenza A that progressively worsened until she had to go to ED on 08/23/2021 and was dx with CAP and left PE, left DVT. She was discharged on the 10th.   Discharge notes from provider.   DVT/PE -CTA noted small subsegmental right middle lobe pulmonary embolus; ultrasound of the lower extremities revealed DVT within the left peroneal vein. Patient originally presented with pleuritic chest pain -She remained stable on room air from a PE standpoint and also remained hemodynamically stable -TTE was normal with no right heart strain and normal RV systolic function -Her VTE is most likely provoked in the setting of relatively decreased mobility from her recent influenza infection -On discharge, she will continue Eliquis for 3 months; instructed to follow-up with PCP to obtain follow-up ultrasound of the left lower extremity after completion of Eliquis therapy to reassess for continued presence of VTE  Pt is still having a considerable amout of left sided chest pain. She does not feel like oxycodone 5 times a day is helping it. She is still short of breath and exteremly fatigued. She continues to use albuterol. She is on eliquis. She just finished cefdinir. She is coughing but mostly dry.   Review of Systems See HPI.     Objective:   Physical Exam Vitals reviewed.  Constitutional:      Appearance: Normal appearance.  HENT:     Head: Normocephalic.     Right Ear: Tympanic membrane normal.     Left Ear: Tympanic membrane normal.     Mouth/Throat:     Mouth: Mucous membranes are moist.  Eyes:     Conjunctiva/sclera: Conjunctivae normal.  Cardiovascular:     Rate and Rhythm: Tachycardia present.  Pulmonary:     Effort: Pulmonary effort is normal.     Breath sounds: No wheezing or rhonchi.   Musculoskeletal:     Right lower leg: No edema.     Left lower leg: No edema.     Comments: No lower ext tenderness, redness, pain and/or swelling.   Lymphadenopathy:     Cervical: No cervical adenopathy.  Neurological:     General: No focal deficit present.     Mental Status: She is alert.  Psychiatric:        Mood and Affect: Mood normal.          Assessment & Plan:  Marland KitchenMarland KitchenVenola was seen today for follow-up.  Diagnoses and all orders for this visit:  Community acquired pneumonia, unspecified laterality -     DG Chest 2 View; Future -     HYDROcodone bit-homatropine (HYCODAN) 5-1.5 MG/5ML syrup; Take 5 mLs by mouth every 8 (eight) hours as needed for cough. -     CBC with Differential/Platelet -     COMPLETE METABOLIC PANEL WITH GFR -     Magnesium -     predniSONE (DELTASONE) 50 MG tablet; Take one tablet for 5 days. -     methylPREDNISolone sodium succinate (SOLU-MEDROL) 125 mg/2 mL injection 125 mg -     ketorolac (TORADOL) injection 60 mg  Other acute pulmonary embolism without acute cor pulmonale (HCC) -     CBC with Differential/Platelet -     COMPLETE METABOLIC PANEL WITH GFR -     Magnesium  SOB (shortness of breath) -     CBC with Differential/Platelet -     COMPLETE METABOLIC PANEL WITH GFR -     Magnesium -     predniSONE (DELTASONE) 50 MG tablet; Take one tablet for 5 days.  Left-sided chest pain -     DG Chest 2 View; Future -     CBC with Differential/Platelet -     COMPLETE METABOLIC PANEL WITH GFR -     Magnesium  Pleural effusion on left -     furosemide (LASIX) 20 MG tablet; Take 1 tablet (20 mg total) by mouth daily.  Follow up on pneumonia with STAT CXR. Showed left pleural effusion.  Pulse ox is 100 percent which is reassuring. Bilateral legs with no swelling. Solumedrol 125 and toradol 60 for pain/inflammation Start lasix daily for 1 week, then follow up.  Continue on eliquis, follow up in 3 months with another [place]  Will get CBC, CMP,  magnesium to follow up.  Follow up as needed or if symptoms worsen.

## 2021-08-29 ENCOUNTER — Encounter: Payer: Self-pay | Admitting: Physician Assistant

## 2021-08-29 LAB — CBC WITH DIFFERENTIAL/PLATELET
Absolute Monocytes: 621 cells/uL (ref 200–950)
Basophils Absolute: 48 cells/uL (ref 0–200)
Basophils Relative: 0.7 %
Eosinophils Absolute: 69 cells/uL (ref 15–500)
Eosinophils Relative: 1 %
HCT: 26.8 % — ABNORMAL LOW (ref 35.0–45.0)
Hemoglobin: 8.7 g/dL — ABNORMAL LOW (ref 11.7–15.5)
Lymphs Abs: 1042 cells/uL (ref 850–3900)
MCH: 30.1 pg (ref 27.0–33.0)
MCHC: 32.5 g/dL (ref 32.0–36.0)
MCV: 92.7 fL (ref 80.0–100.0)
MPV: 9.5 fL (ref 7.5–12.5)
Monocytes Relative: 9 %
Neutro Abs: 5120 cells/uL (ref 1500–7800)
Neutrophils Relative %: 74.2 %
Platelets: 441 10*3/uL — ABNORMAL HIGH (ref 140–400)
RBC: 2.89 10*6/uL — ABNORMAL LOW (ref 3.80–5.10)
RDW: 14.3 % (ref 11.0–15.0)
Total Lymphocyte: 15.1 %
WBC: 6.9 10*3/uL (ref 3.8–10.8)

## 2021-08-29 LAB — COMPLETE METABOLIC PANEL WITH GFR
AG Ratio: 1 (calc) (ref 1.0–2.5)
ALT: 12 U/L (ref 6–29)
AST: 11 U/L (ref 10–35)
Albumin: 2.7 g/dL — ABNORMAL LOW (ref 3.6–5.1)
Alkaline phosphatase (APISO): 69 U/L (ref 37–153)
BUN: 12 mg/dL (ref 7–25)
CO2: 27 mmol/L (ref 20–32)
Calcium: 8.3 mg/dL — ABNORMAL LOW (ref 8.6–10.4)
Chloride: 108 mmol/L (ref 98–110)
Creat: 0.56 mg/dL (ref 0.50–1.03)
Globulin: 2.7 g/dL (calc) (ref 1.9–3.7)
Glucose, Bld: 134 mg/dL — ABNORMAL HIGH (ref 65–99)
Potassium: 4.1 mmol/L (ref 3.5–5.3)
Sodium: 142 mmol/L (ref 135–146)
Total Bilirubin: 0.2 mg/dL (ref 0.2–1.2)
Total Protein: 5.4 g/dL — ABNORMAL LOW (ref 6.1–8.1)
eGFR: 111 mL/min/{1.73_m2} (ref >=60)

## 2021-08-29 LAB — MAGNESIUM: Magnesium: 1.9 mg/dL (ref 1.5–2.5)

## 2021-08-29 NOTE — Progress Notes (Signed)
Kimberly Wade,   Magneisum normal but low low. Take OTC 400mg  daily.  Protein low but that is due to the malnourishment for weeks. Advance diet as tolerated. Calcium is low increase calcium rich foods and get OTC supplement of 1200mg .  Liver and kidney look great. Sodium and potassium look good.  Hemoglobin is low likely due to pneumonia and look clot.  Start ferrous sulfate 325mg  daily with breakfast. WBC is good.

## 2021-08-30 ENCOUNTER — Other Ambulatory Visit: Payer: Self-pay | Admitting: Physician Assistant

## 2021-08-30 ENCOUNTER — Encounter: Payer: Self-pay | Admitting: Medical-Surgical

## 2021-08-30 ENCOUNTER — Encounter: Payer: Self-pay | Admitting: Physician Assistant

## 2021-08-30 ENCOUNTER — Other Ambulatory Visit: Payer: Self-pay

## 2021-08-30 DIAGNOSIS — J189 Pneumonia, unspecified organism: Secondary | ICD-10-CM

## 2021-08-30 DIAGNOSIS — R519 Headache, unspecified: Secondary | ICD-10-CM

## 2021-08-30 DIAGNOSIS — I10 Essential (primary) hypertension: Secondary | ICD-10-CM

## 2021-08-30 DIAGNOSIS — R41840 Attention and concentration deficit: Secondary | ICD-10-CM

## 2021-08-30 MED ORDER — HYDROCODONE BIT-HOMATROP MBR 5-1.5 MG/5ML PO SOLN: 5.0000 mL | Freq: Three times a day (TID) | ORAL | 0 refills | Status: DC | PRN

## 2021-08-30 MED ORDER — AMPHETAMINE-DEXTROAMPHET ER 15 MG PO CP24: 15.0000 mg | ORAL_CAPSULE | ORAL | 0 refills | Status: DC

## 2021-08-30 MED ORDER — POTASSIUM CHLORIDE CRYS ER 20 MEQ PO TBCR: 20.0000 meq | EXTENDED_RELEASE_TABLET | Freq: Every day | ORAL | 1 refills | Status: DC

## 2021-08-30 NOTE — Telephone Encounter (Signed)
Patient left vm stating she is getting short of breath more when she sits up. I advised her to check her O2 stats and if under 90-92 consistently she will need to be evaluated in the ER.   Also asked again about Hycodan. Wants sent to Kristopher Oppenheim at [facility]. Pended.

## 2021-08-30 NOTE — Telephone Encounter (Signed)
Adderall last written 08/06/2021 #30 with no refills  Potassium was from a historical provider.   Please advise on refills.

## 2021-08-30 NOTE — Telephone Encounter (Signed)
See other MyChart message

## 2021-08-30 NOTE — Progress Notes (Signed)
Thank you :)

## 2021-08-30 NOTE — Telephone Encounter (Signed)
Didn't pharmacy say have it tomorrow? If patient does not want to wait until tomorrow will need to call pharmacy and cancel rx before send to another pharmacy.

## 2021-09-03 ENCOUNTER — Other Ambulatory Visit: Payer: Self-pay

## 2021-09-03 ENCOUNTER — Ambulatory Visit (INDEPENDENT_AMBULATORY_CARE_PROVIDER_SITE_OTHER): Payer: Commercial Managed Care - PPO | Admitting: Physician Assistant

## 2021-09-03 ENCOUNTER — Encounter: Payer: Self-pay | Admitting: Physician Assistant

## 2021-09-03 VITALS — BP 133/79 | HR 92 | Ht 65.0 in | Wt 126.0 lb

## 2021-09-03 DIAGNOSIS — E8809 Other disorders of plasma-protein metabolism, not elsewhere classified: Secondary | ICD-10-CM

## 2021-09-03 DIAGNOSIS — J189 Pneumonia, unspecified organism: Secondary | ICD-10-CM | POA: Diagnosis not present

## 2021-09-03 DIAGNOSIS — J90 Pleural effusion, not elsewhere classified: Secondary | ICD-10-CM

## 2021-09-03 DIAGNOSIS — R41840 Attention and concentration deficit: Secondary | ICD-10-CM

## 2021-09-03 LAB — CBC WITH DIFFERENTIAL/PLATELET
Absolute Monocytes: 1209 cells/uL — ABNORMAL HIGH (ref 200–950)
Basophils Absolute: 39 cells/uL (ref 0–200)
Basophils Relative: 0.2 %
Eosinophils Absolute: 59 cells/uL (ref 15–500)
Eosinophils Relative: 0.3 %
HCT: 32.9 % — ABNORMAL LOW (ref 35.0–45.0)
Hemoglobin: 10.6 g/dL — ABNORMAL LOW (ref 11.7–15.5)
Lymphs Abs: 1229 cells/uL (ref 850–3900)
MCH: 30 pg (ref 27.0–33.0)
MCHC: 32.2 g/dL (ref 32.0–36.0)
MCV: 93.2 fL (ref 80.0–100.0)
MPV: 9.8 fL (ref 7.5–12.5)
Monocytes Relative: 6.2 %
Neutro Abs: 16965 cells/uL — ABNORMAL HIGH (ref 1500–7800)
Neutrophils Relative %: 87 %
Platelets: 454 10*3/uL — ABNORMAL HIGH (ref 140–400)
RBC: 3.53 10*6/uL — ABNORMAL LOW (ref 3.80–5.10)
RDW: 14.8 % (ref 11.0–15.0)
Total Lymphocyte: 6.3 %
WBC: 19.5 10*3/uL — ABNORMAL HIGH (ref 3.8–10.8)

## 2021-09-03 LAB — COMPLETE METABOLIC PANEL WITH GFR
AG Ratio: 1.1 (calc) (ref 1.0–2.5)
ALT: 60 U/L — ABNORMAL HIGH (ref 6–29)
AST: 38 U/L — ABNORMAL HIGH (ref 10–35)
Albumin: 3.6 g/dL (ref 3.6–5.1)
Alkaline phosphatase (APISO): 88 U/L (ref 37–153)
BUN: 20 mg/dL (ref 7–25)
CO2: 27 mmol/L (ref 20–32)
Calcium: 8.8 mg/dL (ref 8.6–10.4)
Chloride: 103 mmol/L (ref 98–110)
Creat: 0.59 mg/dL (ref 0.50–1.03)
Globulin: 3.3 g/dL (calc) (ref 1.9–3.7)
Glucose, Bld: 134 mg/dL — ABNORMAL HIGH (ref 65–99)
Potassium: 4.1 mmol/L (ref 3.5–5.3)
Sodium: 141 mmol/L (ref 135–146)
Total Bilirubin: 0.3 mg/dL (ref 0.2–1.2)
Total Protein: 6.9 g/dL (ref 6.1–8.1)
eGFR: 110 mL/min/{1.73_m2} (ref >=60)

## 2021-09-03 LAB — MAGNESIUM: Magnesium: 2.2 mg/dL (ref 1.5–2.5)

## 2021-09-03 MED ORDER — AMPHETAMINE-DEXTROAMPHET ER 15 MG PO CP24: 15.0000 mg | ORAL_CAPSULE | ORAL | 0 refills | Status: DC

## 2021-09-03 MED ORDER — HYDROCODONE BIT-HOMATROP MBR 5-1.5 MG/5ML PO SOLN: 5.0000 mL | Freq: Three times a day (TID) | ORAL | 0 refills | Status: DC | PRN

## 2021-09-03 NOTE — Patient Instructions (Signed)
Pleural Effusion Pleural effusion is an abnormal buildup of fluid in the layers of tissue between the lungs and the inside of the chest. This space is called the pleural space. The two layers of tissue that line the lungs and the inside of the chest are called pleura. Usually, there is no air in the space between the pleura, only a thin layer of fluid. Some conditions can cause a large amount of fluid to build up, which can cause the lung to collapse if untreated. A pleural effusion is usually caused by another disease that requires treatment. What are the causes? Pleural effusion can be caused by: Conditions that change the pressure in blood vessels, which causes blood and fluid to flow outside their normal areas. These conditions include: Partially collapsed lung tissue (atelectasis). Heart failure. Nephrotic syndrome. This is a kidney condition that causes the body to pass too much protein in urine. Peritoneal dialysis. This is a procedure in which the lining of the stomach is used to filter blood as a treatment for kidney failure. Liver disease (cirrhosis). Fluid buildup caused by damaged tissue or cells in the body. This fluid buildup can be caused by: Fluid in the abdomen (ascites). Connective tissue diseases, such as lupus or rheumatoid arthritis. Certain infections, such as pneumonia or tuberculosis. Cancer. What are the signs or symptoms? In some cases, pleural effusion may cause no symptoms. If symptoms are present, they may include: Shortness of breath, especially when lying down. Chest pain. This may get worse when taking a deep breath. Fever. Dry, long-lasting (chronic) cough. Fatigue. Rapid breathing. Night sweats. An underlying condition that is causing the pleural effusion may also cause other symptoms. These conditions include heart failure, pneumonia, blood clots, tuberculosis, or cancer. How is this diagnosed? This condition may be diagnosed based on: Your symptoms and  medical history. A physical exam. A chest X-ray. A procedure to use a needle to remove fluid from the pleural space (thoracentesis). This fluid is tested. Other imaging studies of the chest, such as an ultrasound or a CT scan. How is this treated? Depending on the cause of your condition, treatment may include: Treating the underlying condition that is causing the pleural effusion. When that condition improves, the effusion will also improve. Examples of treatment for underlying conditions include: Antibiotic medicines to treat an infection. Diuretics or other heart medicines to treat heart failure. Thoracentesis. Placing a thin, flexible tube under your skin and into your chest to continuously drain the effusion (indwelling pleural catheter). Surgery to remove the outer layer of tissue from the pleural space (decortication). A procedure to put medicine into the chest cavity to seal the pleural space and prevent fluid buildup (pleurodesis). Chemotherapy and radiation therapy, if your pleural effusion is caused by cancer. These treatments are typically used to kill certain cells in the body. Follow these instructions at home: Take over-the-counter and prescription medicines only as told by your health care provider. Keep track of how long you are able to do mild exercise, such as walking, before you get short of breath. Write down this information to share with your health care provider. Your ability to exercise should improve over time. Do not use any products that contain nicotine or tobacco. These products include cigarettes, chewing tobacco, and vaping devices, such as e-cigarettes. If you need help quitting, ask your health care provider. Return to your normal activities as told by your health care provider. Ask your health care provider what activities are safe for you. Keep all  follow-up visits. This is important. Contact a health care provider if: The amount of time that you are able to  do mild exercise decreases or does not improve with time. You have a fever. Get help right away if: You are short of breath. You develop chest pain. You develop a new cough. These symptoms may be an emergency. Get help right away. Call 911. Do not wait to see if the symptoms will go away. Do not drive yourself to the hospital. Summary Pleural effusion is an abnormal buildup of fluid in the layers of tissue between the lungs and the inside of the chest. Pleural effusion can have many causes, including heart failure, pulmonary embolism, infections, or cancer. Symptoms of pleural effusion can include shortness of breath, chest pain, fever, long-lasting (chronic) cough, hiccups, or rapid breathing. Diagnosis often involves making images of the chest (such as with ultrasound or X-ray) and removing fluid (thoracentesis) to send for testing. Treatment for pleural effusion depends on the underlying condition that is causing it. This information is not intended to replace advice given to you by your health care provider. Make sure you discuss any questions you have with your health care provider. Document Revised: 02/26/2021 Document Reviewed: 02/26/2021 Elsevier Patient Education  [employer].

## 2021-09-03 NOTE — Progress Notes (Signed)
Subjective:    Patient ID: Kimberly Wade, female    DOB: Sep 12, 1970, 51 y.o.   MRN: [id number]  HPI 51 YO female presents to the clinic for a 1 week follow up. Patient was seen on 08/28/21 for a hospital follow up on pulmonary embolism, community acquired pneumonia, and dvt. A CXR on 1/11 showed a pleural effusion and patient was treated with a diuretic.   Patient reports she is still short of breath which is worse with exertion and sitting up. She is able to catch her breath when she relaxes. She reports her cough is still bothersome. She had a coughing fit last night and pulled a muscle on the left side of her abdomen, which feels better when she applies pressure. Patient denies any chest pain. Patient reports a poor appetite but she has been making herself eat. She has started taking ensure high protein to supplement her diet.    Review of Systems  Constitutional:  Positive for appetite change. Negative for chills and fever.  HENT:  Positive for congestion and ear pain.   Respiratory:  Positive for cough and shortness of breath. Negative for chest tightness.   Cardiovascular:  Negative for chest pain and leg swelling.  Gastrointestinal:  Negative for abdominal pain, constipation, diarrhea, nausea and vomiting.  Genitourinary:  Negative for difficulty urinating, dysuria, flank pain and frequency.       Objective:   Physical Exam Constitutional:      General: She is not in acute distress.    Appearance: Normal appearance. She is normal weight. She is not ill-appearing, toxic-appearing or diaphoretic.  HENT:     Head: Normocephalic and atraumatic.     Right Ear: Tympanic membrane, ear canal and external ear normal.     Left Ear: Ear canal and external ear normal.     Ears:     Comments: Left TM dull    Nose: Nose normal.     Mouth/Throat:     Mouth: Mucous membranes are moist.     Pharynx: No oropharyngeal exudate or posterior oropharyngeal erythema.  Eyes:      Conjunctiva/sclera: Conjunctivae normal.  Cardiovascular:     Rate and Rhythm: Normal rate and regular rhythm.     Pulses: Normal pulses.     Heart sounds: Normal heart sounds.  Pulmonary:     Breath sounds: Examination of the left-lower field reveals decreased breath sounds. Decreased breath sounds present.  Abdominal:     General: Abdomen is flat. Bowel sounds are normal. There is no distension.     Palpations: Abdomen is soft.     Tenderness: There is no abdominal tenderness. There is no right CVA tenderness or left CVA tenderness.  Skin:    General: Skin is warm and dry.  Neurological:     Mental Status: She is alert and oriented to person, place, and time.  Psychiatric:        Mood and Affect: Mood normal.        Behavior: Behavior normal.        Thought Content: Thought content normal.        Judgment: Judgment normal.          Assessment & Plan:  Marland KitchenMarland KitchenJonnelle was seen today for follow-up.  Diagnoses and all orders for this visit:  Pleural effusion on left -     COMPLETE METABOLIC PANEL WITH GFR -     CBC with Differential/Platelet -     Magnesium  Community acquired pneumonia, unspecified laterality -  COMPLETE METABOLIC PANEL WITH GFR -     CBC with Differential/Platelet -     Magnesium -     HYDROcodone bit-homatropine (HYCODAN) 5-1.5 MG/5ML syrup; Take 5 mLs by mouth every 8 (eight) hours as needed for cough.  Hypocalcemia -     COMPLETE METABOLIC PANEL WITH GFR -     CBC with Differential/Platelet -     Magnesium  Hypomagnesemia -     COMPLETE METABOLIC PANEL WITH GFR -     CBC with Differential/Platelet -     Magnesium  Hypoalbuminemia -     COMPLETE METABOLIC PANEL WITH GFR -     CBC with Differential/Platelet -     Magnesium  Poor concentration -     amphetamine-dextroamphetamine (ADDERALL XR) 15 MG 24 hr capsule; Take 1 capsule by mouth every morning. -     amphetamine-dextroamphetamine (ADDERALL XR) 15 MG 24 hr capsule; Take 1 capsule by  mouth every morning. -     amphetamine-dextroamphetamine (ADDERALL XR) 15 MG 24 hr capsule; Take 1 capsule by mouth every morning.  Inattention -     amphetamine-dextroamphetamine (ADDERALL XR) 15 MG 24 hr capsule; Take 1 capsule by mouth every morning. -     amphetamine-dextroamphetamine (ADDERALL XR) 15 MG 24 hr capsule; Take 1 capsule by mouth every morning. -     amphetamine-dextroamphetamine (ADDERALL XR) 15 MG 24 hr capsule; Take 1 capsule by mouth every morning.   Obtain labs to check for corrected electrolytes and albumin. Educated patient on continuing a high protein diet. Continue lasix to resolve pleural effusion. Start incentive spirometry to help resolve pleural effusion. Will recheck CXR in 2 weeks to ensure improvement of pleural effusion. Continue hycodan as needed for cough at patients request but will be the last refill. Educated patient to return sooner than 2 weeks if symptoms worsen.   Sent 3 months of adderall to another pharmacy that is cheaper than [facility].   Follow up in 4 weeks.

## 2021-09-04 ENCOUNTER — Other Ambulatory Visit: Payer: Self-pay | Admitting: Neurology

## 2021-09-04 DIAGNOSIS — R748 Abnormal levels of other serum enzymes: Secondary | ICD-10-CM

## 2021-09-04 NOTE — Progress Notes (Signed)
Magnesium, potassium, calcium and albumin look so much better.  Kidney function continues to look good.  Your liver enzymes were not elevated and then they are right now. WBC elevated due to prednisone. Try to decrease pain medication since metabolized through liver.  Recheck liver enzymes in 2 weeks.

## 2021-09-10 ENCOUNTER — Ambulatory Visit (INDEPENDENT_AMBULATORY_CARE_PROVIDER_SITE_OTHER): Payer: Self-pay | Admitting: Physician Assistant

## 2021-09-10 DIAGNOSIS — Z91199 Patient's noncompliance with other medical treatment and regimen due to unspecified reason: Secondary | ICD-10-CM

## 2021-09-10 NOTE — Progress Notes (Signed)
No show

## 2021-09-12 ENCOUNTER — Encounter: Payer: Self-pay | Admitting: Physician Assistant

## 2021-09-12 ENCOUNTER — Telehealth: Payer: Self-pay

## 2021-09-12 NOTE — Telephone Encounter (Signed)
LVM for Kimberly Wade to call back to get a virtual visit scheduled. AM

## 2021-09-13 ENCOUNTER — Encounter: Payer: Self-pay | Admitting: Physician Assistant

## 2021-09-13 ENCOUNTER — Telehealth (INDEPENDENT_AMBULATORY_CARE_PROVIDER_SITE_OTHER): Payer: Commercial Managed Care - PPO | Admitting: Physician Assistant

## 2021-09-13 DIAGNOSIS — J90 Pleural effusion, not elsewhere classified: Secondary | ICD-10-CM | POA: Diagnosis not present

## 2021-09-13 DIAGNOSIS — U071 COVID-19: Secondary | ICD-10-CM | POA: Diagnosis not present

## 2021-09-13 DIAGNOSIS — R0781 Pleurodynia: Secondary | ICD-10-CM

## 2021-09-13 DIAGNOSIS — J189 Pneumonia, unspecified organism: Secondary | ICD-10-CM | POA: Diagnosis not present

## 2021-09-13 MED ORDER — MOLNUPIRAVIR EUA 200MG CAPSULE: 4.0000 | ORAL_CAPSULE | Freq: Two times a day (BID) | ORAL | 0 refills | Status: DC

## 2021-09-13 MED ORDER — MOLNUPIRAVIR EUA 200MG CAPSULE: 4.0000 | ORAL_CAPSULE | Freq: Two times a day (BID) | ORAL | 0 refills | Status: AC

## 2021-09-13 MED ORDER — HYDROCODONE BIT-HOMATROP MBR 5-1.5 MG/5ML PO SOLN: 5.0000 mL | Freq: Three times a day (TID) | ORAL | 0 refills | Status: DC | PRN

## 2021-09-13 NOTE — Telephone Encounter (Signed)
Double booked both for virtual for this afternoon.

## 2021-09-13 NOTE — Progress Notes (Signed)
..Virtual Visit via Telephone Note  I connected with Kimberly Wade on 09/16/21 at  3:20 PM EST by telephone and verified that I am speaking with the correct person using two identifiers.  Location: Patient: home Provider: clinic  .Marland KitchenParticipating in visit:  Patient: none Provider: Iran Planas PA-C Provider in training: Judithann Sheen PA-S     I discussed the limitations, risks, security and privacy concerns of performing an evaluation and management service by telephone and the availability of in person appointments. I also discussed with the patient that there may be a patient responsible charge related to this service. The patient expressed understanding and agreed to proceed.   History of Present Illness: Pt is a 51 yo female with recent CAP, influenza with left pleural effusion who had home covid test positive Tuesday. Pt is running a fever around 101. Pt is very tired, achy and her left chest wall is hurting again. No way to check pulse ox. She is coughing and SOb. No lower ext edema. She would like cough syrup.   .. Active Ambulatory Problems    Diagnosis Date Noted   Insomnia 12/24/2012   Depression 12/24/2012   Migraine 12/24/2012   Hyperlipidemia 12/24/2012   Vitamin D deficiency 12/24/2012   Essential hypertension, benign 12/24/2012   Anxiety 12/24/2012   S/P gastric bypass 12/24/2012   Increased PTH level 12/24/2012   GERD (gastroesophageal reflux disease) 12/24/2012   Aspiration into lower respiratory tract 05/02/2013   Primary osteoarthritis of right hand 02/02/2014   Tension headache 08/08/2014   Ovarian cyst, right 09/14/2015   Metrorrhagia 09/18/2015   Anemia 10/26/2015   Right trigger finger 05/05/2016   Low back pain without sciatica 08/08/2016   Generalized anxiety disorder 08/09/2016   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 01/30/2017   Overweight (BMI 25.0-29.9) 02/01/2017   Acute shoulder bursitis, left  04/03/2017   Biceps muscle tear, left, subsequent encounter 06/08/2017   Complete tear of left rotator cuff 06/08/2017   S/P shoulder surgery 02/12/2018   Chronic left shoulder pain 02/12/2018   No energy 02/17/2018   Biceps rupture, proximal, right, initial encounter 06/03/2018   Raynaud's disease without gangrene 03/29/2019   Post concussion syndrome 03/29/2019   Fall 12/31/2020   Inattention 12/31/2020   Poor concentration 12/31/2020   Chronic pain of both shoulders 05/10/2021   Chronic pain of both knees 05/10/2021   Polyarthritis 05/10/2021   Pleural effusion on left 08/28/2021   Left-sided chest pain 08/28/2021   SOB (shortness of breath) 08/28/2021   Pulmonary embolus ([place]) 08/28/2021   Hypocalcemia 09/03/2021   Hypomagnesemia 09/03/2021   Community acquired pneumonia 09/03/2021   Hypoalbuminemia 09/03/2021   Resolved Ambulatory Problems    Diagnosis Date Noted   Lingular pneumonia 12/24/2012   Type 2 diabetes mellitus ([place]) 12/24/2012   Sinusitis, chronic 12/24/2012   Diabetic retinopathy ([place]) 12/24/2012   Right hand pain 05/05/2016   Acute cystitis 06/18/2016   Influenza-like illness 08/29/2016   Acute non-recurrent maxillary sinusitis 10/06/2017   Past Medical History:  Diagnosis Date   Arthritis    Chronic pain    Hypertension    Pneumococcal pneumonia ([place]) 2000   Pneumonia      Observations/Objective: No acute distress Some heavy breathing with productive cough  .Marland KitchenThere were no vitals filed for this visit. There is no height or weight on file to calculate BMI.    Assessment and Plan: Marland KitchenMarland KitchenSoraiya was seen today for covid positive.  Diagnoses and all  orders for this visit:  COVID-19 virus infection -     DG Chest 2 View; Future -     molnupiravir EUA (LAGEVRIO) 200 mg CAPS capsule; Take 4 capsules (800 mg total) by mouth 2 (two) times daily for 5 days.  Community acquired pneumonia, unspecified laterality -     DG Chest 2 View; Future -      HYDROcodone bit-homatropine (HYCODAN) 5-1.5 MG/5ML syrup; Take 5 mLs by mouth every 8 (eight) hours as needed for cough.  Pleuritic chest pain  Pleural effusion on left  Other orders -     Discontinue: molnupiravir EUA (LAGEVRIO) 200 mg CAPS capsule; Take 4 capsules (800 mg total) by mouth 2 (two) times daily for 5 days.   Repeat CXR to follow up on pleural effusion and pneumonia on Monday.  Pt had positive home test Pt is higher risk for complications Due to interaction with paxlovid and oxycodone sent molnupavir.  Discussed symptomatic care. Rest and hydrate.  Quarantine 5 days from symptoms then ok to come out after 5 days of no symptoms.  Discussed worsening symptoms and when to go to ED.    Follow Up Instructions:    I discussed the assessment and treatment plan with the patient. The patient was provided an opportunity to ask questions and all were answered. The patient agreed with the plan and demonstrated an understanding of the instructions.   The patient was advised to call back or seek an in-person evaluation if the symptoms worsen or if the condition fails to improve as anticipated.  I provided 15 minutes of non-face-to-face time during this encounter.   Iran Planas, PA-C

## 2021-09-13 NOTE — Telephone Encounter (Signed)
[place] for appt for both.

## 2021-09-16 ENCOUNTER — Other Ambulatory Visit: Payer: Self-pay

## 2021-09-16 ENCOUNTER — Ambulatory Visit (INDEPENDENT_AMBULATORY_CARE_PROVIDER_SITE_OTHER): Payer: Commercial Managed Care - PPO

## 2021-09-16 DIAGNOSIS — J189 Pneumonia, unspecified organism: Secondary | ICD-10-CM

## 2021-09-16 DIAGNOSIS — U071 COVID-19: Secondary | ICD-10-CM | POA: Diagnosis not present

## 2021-09-16 DIAGNOSIS — J90 Pleural effusion, not elsewhere classified: Secondary | ICD-10-CM

## 2021-09-17 NOTE — Progress Notes (Signed)
Opacity has decreased at left lung base but still there. How are you feeling? I think we should like get chest CT in a few weeks to make sure full evaluated.

## 2021-09-17 NOTE — Progress Notes (Signed)
I ordered CT of chest with contrast to evalute.

## 2021-09-20 ENCOUNTER — Ambulatory Visit (INDEPENDENT_AMBULATORY_CARE_PROVIDER_SITE_OTHER): Payer: Commercial Managed Care - PPO

## 2021-09-20 ENCOUNTER — Other Ambulatory Visit: Payer: Self-pay

## 2021-09-20 DIAGNOSIS — J90 Pleural effusion, not elsewhere classified: Secondary | ICD-10-CM | POA: Diagnosis not present

## 2021-09-20 DIAGNOSIS — J189 Pneumonia, unspecified organism: Secondary | ICD-10-CM | POA: Diagnosis not present

## 2021-09-20 DIAGNOSIS — U071 COVID-19: Secondary | ICD-10-CM

## 2021-09-20 MED ORDER — IOHEXOL 300 MG/ML  SOLN
100.0000 mL | Freq: Once | INTRAMUSCULAR | Status: AC | PRN
  Administered 2021-09-20: 70 mL via INTRAVENOUS

## 2021-09-23 ENCOUNTER — Encounter: Payer: Self-pay | Admitting: Physician Assistant

## 2021-09-23 ENCOUNTER — Other Ambulatory Visit: Payer: Self-pay | Admitting: Physician Assistant

## 2021-09-23 DIAGNOSIS — R41840 Attention and concentration deficit: Secondary | ICD-10-CM

## 2021-09-23 DIAGNOSIS — J189 Pneumonia, unspecified organism: Secondary | ICD-10-CM

## 2021-09-23 MED ORDER — NUZYRA 150 MG PO TABS: ORAL_TABLET | ORAL | 0 refills | Status: DC

## 2021-09-23 NOTE — Progress Notes (Signed)
Left lung looks like pneumonia and not malignancy which is overall great news. We do need to see pneumonia clear. Ofcourse with your positive covid test some of the pneumonia could be viral but also since you had pneumonia before covid some could be bacteria. I would like to try new antibiotic that could be very effective in treating infections. Nuzyra. It does have to go to pharmacy in [place] where they have in stock. Can you go and pick up there?

## 2021-10-01 ENCOUNTER — Other Ambulatory Visit: Payer: Self-pay | Admitting: Physician Assistant

## 2021-10-01 DIAGNOSIS — F419 Anxiety disorder, unspecified: Secondary | ICD-10-CM

## 2021-10-01 DIAGNOSIS — F411 Generalized anxiety disorder: Secondary | ICD-10-CM

## 2021-10-02 ENCOUNTER — Telehealth: Payer: Self-pay | Admitting: General Practice

## 2021-10-02 NOTE — Telephone Encounter (Signed)
Transition Care Management Unsuccessful Follow-up Telephone Call  Date of discharge and from where:  09/30/21 from [facility]  Attempts:  1st Attempt  Reason for unsuccessful TCM follow-up call:  Left voice message

## 2021-10-04 NOTE — Telephone Encounter (Signed)
Transition Care Management Unsuccessful Follow-up Telephone Call  Date of discharge and from where:  09/30/21 from [facility]  Attempts:  2nd Attempt  Reason for unsuccessful TCM follow-up call:  No answer/busy

## 2021-10-08 MED ORDER — AMPHETAMINE-DEXTROAMPHET ER 15 MG PO CP24: 15.0000 mg | ORAL_CAPSULE | ORAL | 0 refills | Status: DC

## 2021-10-09 NOTE — Telephone Encounter (Signed)
Transition Care Management Unsuccessful Follow-up Telephone Call  Date of discharge and from where:  09/30/21 from [facility]  Attempts:  3rd Attempt  Reason for unsuccessful TCM follow-up call:  No answer/busy

## 2021-10-14 ENCOUNTER — Telehealth: Payer: Self-pay | Admitting: General Practice

## 2021-10-14 NOTE — Telephone Encounter (Signed)
Transition Care Management Unsuccessful Follow-up Telephone Call  Date of discharge and from where:  10/11/21 from [facility]  Attempts:  1st Attempt  Reason for unsuccessful TCM follow-up call:  Left voice message

## 2021-10-16 NOTE — Telephone Encounter (Signed)
Transition Care Management Unsuccessful Follow-up Telephone Call ? ?Date of discharge and from where:  10/11/21 from [facility] ? ?Attempts:  2nd Attempt ? ?Reason for unsuccessful TCM follow-up call:  No answer/busy ? ?  ?

## 2021-10-18 NOTE — Telephone Encounter (Signed)
Transition Care Management Unsuccessful Follow-up Telephone Call ? ?Date of discharge and from where:  10/11/21 from [facility] ? ?Attempts:  3rd Attempt ? ?Reason for unsuccessful TCM follow-up call:  Unable to reach patient ? ?  ?

## 2021-10-29 ENCOUNTER — Inpatient Hospital Stay: Payer: Commercial Managed Care - PPO | Admitting: Physician Assistant

## 2021-11-11 ENCOUNTER — Encounter: Payer: Self-pay | Admitting: Physician Assistant

## 2021-11-11 DIAGNOSIS — J189 Pneumonia, unspecified organism: Secondary | ICD-10-CM

## 2021-11-11 DIAGNOSIS — R41840 Attention and concentration deficit: Secondary | ICD-10-CM

## 2021-11-11 MED ORDER — AMPHETAMINE-DEXTROAMPHET ER 15 MG PO CP24: 15.0000 mg | ORAL_CAPSULE | ORAL | 0 refills | Status: DC

## 2021-11-19 ENCOUNTER — Other Ambulatory Visit: Payer: Self-pay | Admitting: Physician Assistant

## 2021-11-20 ENCOUNTER — Other Ambulatory Visit: Payer: Self-pay | Admitting: Physician Assistant

## 2021-12-06 ENCOUNTER — Telehealth: Payer: Commercial Managed Care - PPO | Admitting: Physician Assistant

## 2021-12-06 ENCOUNTER — Encounter: Payer: Self-pay | Admitting: Physician Assistant

## 2021-12-06 DIAGNOSIS — R3989 Other symptoms and signs involving the genitourinary system: Secondary | ICD-10-CM

## 2021-12-06 DIAGNOSIS — F330 Major depressive disorder, recurrent, mild: Secondary | ICD-10-CM

## 2021-12-06 DIAGNOSIS — B379 Candidiasis, unspecified: Secondary | ICD-10-CM | POA: Diagnosis not present

## 2021-12-06 DIAGNOSIS — T3695XA Adverse effect of unspecified systemic antibiotic, initial encounter: Secondary | ICD-10-CM

## 2021-12-06 DIAGNOSIS — F419 Anxiety disorder, unspecified: Secondary | ICD-10-CM

## 2021-12-06 DIAGNOSIS — R41840 Attention and concentration deficit: Secondary | ICD-10-CM

## 2021-12-06 MED ORDER — SULFAMETHOXAZOLE-TRIMETHOPRIM 800-160 MG PO TABS: 1.0000 | ORAL_TABLET | Freq: Two times a day (BID) | ORAL | 0 refills | Status: DC

## 2021-12-06 MED ORDER — FLUCONAZOLE 150 MG PO TABS: 150.0000 mg | ORAL_TABLET | ORAL | 0 refills | Status: DC | PRN

## 2021-12-06 NOTE — Progress Notes (Signed)
E-Visit for Urinary Problems ? ?We are sorry that you are not feeling well.  Here is how we plan to help! ? ?Based on what you shared with me it looks like you most likely have a simple urinary tract infection. ? ?A UTI (Urinary Tract Infection) is a bacterial infection of the bladder. ? ?Most cases of urinary tract infections are simple to treat but a key part of your care is to encourage you to drink plenty of fluids and watch your symptoms carefully. ? ?I have prescribed Bactrim DS One tablet twice a day for 5 days.  Your symptoms should gradually improve. Call us if the burning in your urine worsens, you develop worsening fever, back pain or pelvic pain or if your symptoms do not resolve after completing the antibiotic. ? ?Diflucan will be prescribed for the antibiotic induced yeast infection.  ? ?Urinary tract infections can be prevented by drinking plenty of water to keep your body hydrated.  Also be sure when you wipe, wipe from front to back and don't hold it in!  If possible, empty your bladder every 4 hours. ? ?HOME CARE ?Drink plenty of fluids ?Compete the full course of the antibiotics even if the symptoms resolve ?Remember, when you need to go?go. Holding in your urine can increase the likelihood of getting a UTI! ?GET HELP RIGHT AWAY IF: ?You cannot urinate ?You get a high fever ?Worsening back pain occurs ?You see blood in your urine ?You feel sick to your stomach or throw up ?You feel like you are going to pass out ? ?MAKE SURE YOU  ?Understand these instructions. ?Will watch your condition. ?Will get help right away if you are not doing well or get worse. ? ?For your other medication refills, we do request for you to contact your Primary Care Provider for these.  ? ?Thank you for choosing an e-visit. ? ?Your e-visit answers were reviewed by a board certified advanced clinical practitioner to complete your personal care plan. Depending upon the condition, your plan could have included both over the  counter or prescription medications. ? ?Please review your pharmacy choice. Make sure the pharmacy is open so you can pick up prescription now. If there is a problem, you may contact your provider through [employer] and have the prescription routed to another pharmacy.  Your safety is important to [place]. If you have drug allergies check your prescription carefully.  ? ?For the next 24 hours you can use MyChart to ask questions about today's visit, request a non-urgent call back, or ask for a work or school excuse. ?You will get an email in the next two days asking about your experience. I hope that your e-visit has been valuable and will speed your recovery. ? ? ?I provided 5 minutes of non face-to-face time during this encounter for chart review and documentation.  ? ?

## 2021-12-11 ENCOUNTER — Other Ambulatory Visit: Payer: Self-pay | Admitting: Physician Assistant

## 2021-12-11 ENCOUNTER — Telehealth: Payer: Self-pay | Admitting: Physician Assistant

## 2021-12-11 ENCOUNTER — Other Ambulatory Visit: Payer: Self-pay | Admitting: Neurology

## 2021-12-11 ENCOUNTER — Ambulatory Visit (INDEPENDENT_AMBULATORY_CARE_PROVIDER_SITE_OTHER): Payer: Self-pay

## 2021-12-11 DIAGNOSIS — J189 Pneumonia, unspecified organism: Secondary | ICD-10-CM

## 2021-12-11 DIAGNOSIS — I10 Essential (primary) hypertension: Secondary | ICD-10-CM

## 2021-12-11 MED ORDER — VENLAFAXINE HCL ER 75 MG PO CP24: 225.0000 mg | ORAL_CAPSULE | Freq: Every day | ORAL | 1 refills | Status: DC

## 2021-12-11 MED ORDER — APIXABAN 5 MG PO TABS: ORAL_TABLET | ORAL | 0 refills | Status: DC

## 2021-12-11 MED ORDER — AMPHETAMINE-DEXTROAMPHET ER 15 MG PO CP24: 15.0000 mg | ORAL_CAPSULE | ORAL | 0 refills | Status: DC

## 2021-12-11 MED ORDER — HYDROCHLOROTHIAZIDE 25 MG PO TABS: ORAL_TABLET | ORAL | 1 refills | Status: DC

## 2021-12-11 NOTE — Telephone Encounter (Signed)
Patient came in office & wanted to be seen by [facility]. She wanted imaging orders placed & lab orders placed. She had an appointment scheduled for 12/20/21 but wanted to be seen today. Spoke with Dewitt Hoes and JJ let me know that Imaging had been placed but she will have to have a visit with Jade to discuss this & have the orders placed for labs. Patient was upset that she drove an hour to get here and no appointment slots were open today. The next available appt with Luvenia Starch was the one she had on 12/20/21. I let patient know I am very sorry but there is no appointment slots for today with Jade & she said "I am sure you are...." and walked out. 12/11/21 ?

## 2021-12-11 NOTE — Progress Notes (Signed)
Pt seen in [place] today and they sent message that she had 2 pills left of Eliqus and needed a refill before her may 5th date. Sent one month of eliqus.  ?

## 2021-12-11 NOTE — Telephone Encounter (Signed)
Edit to add: Patient also wanted appt on 12/20/21 cancelled. ?

## 2021-12-12 ENCOUNTER — Other Ambulatory Visit: Payer: Self-pay | Admitting: Physician Assistant

## 2021-12-12 NOTE — Progress Notes (Signed)
Significant improvement in lungs! Great news.

## 2021-12-17 ENCOUNTER — Encounter: Payer: Self-pay | Admitting: Physician Assistant

## 2021-12-17 ENCOUNTER — Ambulatory Visit (INDEPENDENT_AMBULATORY_CARE_PROVIDER_SITE_OTHER): Payer: Self-pay | Admitting: Physician Assistant

## 2021-12-17 ENCOUNTER — Telehealth: Payer: Self-pay | Admitting: General Practice

## 2021-12-17 VITALS — BP 147/74 | HR 73 | Ht 65.0 in | Wt 145.0 lb

## 2021-12-17 DIAGNOSIS — D649 Anemia, unspecified: Secondary | ICD-10-CM

## 2021-12-17 DIAGNOSIS — R410 Disorientation, unspecified: Secondary | ICD-10-CM | POA: Insufficient documentation

## 2021-12-17 DIAGNOSIS — M25362 Other instability, left knee: Secondary | ICD-10-CM

## 2021-12-17 DIAGNOSIS — Z79899 Other long term (current) drug therapy: Secondary | ICD-10-CM

## 2021-12-17 DIAGNOSIS — F419 Anxiety disorder, unspecified: Secondary | ICD-10-CM

## 2021-12-17 DIAGNOSIS — E876 Hypokalemia: Secondary | ICD-10-CM

## 2021-12-17 DIAGNOSIS — I10 Essential (primary) hypertension: Secondary | ICD-10-CM

## 2021-12-17 DIAGNOSIS — M25562 Pain in left knee: Secondary | ICD-10-CM

## 2021-12-17 DIAGNOSIS — F1120 Opioid dependence, uncomplicated: Secondary | ICD-10-CM

## 2021-12-17 DIAGNOSIS — F988 Other specified behavioral and emotional disorders with onset usually occurring in childhood and adolescence: Secondary | ICD-10-CM

## 2021-12-17 DIAGNOSIS — Z20822 Contact with and (suspected) exposure to covid-19: Secondary | ICD-10-CM

## 2021-12-17 DIAGNOSIS — E278 Other specified disorders of adrenal gland: Secondary | ICD-10-CM

## 2021-12-17 DIAGNOSIS — F5101 Primary insomnia: Secondary | ICD-10-CM

## 2021-12-17 DIAGNOSIS — F330 Major depressive disorder, recurrent, mild: Secondary | ICD-10-CM

## 2021-12-17 MED ORDER — BUSPIRONE HCL 7.5 MG PO TABS: 7.5000 mg | ORAL_TABLET | Freq: Three times a day (TID) | ORAL | 0 refills | Status: DC

## 2021-12-17 MED ORDER — MIRTAZAPINE 7.5 MG PO TABS: 7.5000 mg | ORAL_TABLET | Freq: Every day | ORAL | 1 refills | Status: DC

## 2021-12-17 MED ORDER — ARIPIPRAZOLE 10 MG PO TABS: 10.0000 mg | ORAL_TABLET | Freq: Every day | ORAL | 1 refills | Status: DC

## 2021-12-17 MED ORDER — NALOXONE HCL 4 MG/0.1ML NA LIQD: 1.0000 | NASAL | 1 refills | Status: DC | PRN

## 2021-12-17 MED ORDER — LISDEXAMFETAMINE DIMESYLATE 20 MG PO CAPS: 20.0000 mg | ORAL_CAPSULE | ORAL | 0 refills | Status: DC

## 2021-12-17 NOTE — Telephone Encounter (Signed)
Transition Care Management Follow-up Telephone Call ?Date of discharge and from where: 12/13/21 from [facility] ?How have you been since you were released from the hospital? Patient has an appt with PCP today at 2p.m. ?Any questions or concerns? No ? ? ?

## 2021-12-17 NOTE — Progress Notes (Signed)
? ?Established Patient Office Visit ? ?Subjective   ?Patient ID: Kimberly Wade, female    DOB: [birth year]  Age: 51 y.o. MRN: [id number] ? ?Chief Complaint  ?Patient presents with  ? Knee Pain  ? ? ?HPI ?Pt is a 51 yo female who presents to the clinic with husband to follow up from hospital from fall and left knee injury.  ? ?Went to [facility] and we cannot see records.  ? ?Husband is concerned because she fell due to confusion and altered mental states which she has been having episodes of last 5-30 minutes. They are very scary to him. She imagines stuff but denies any hallucinations or voices. Per patient and per husband tox screen in hospital was normal. She is taking oxycodone 5 times a day and sees pain clinic. Husband is worried bout her medication and wants to do over them. She is very anxious and depressed.  ? ?Her left knee is very painful and swollen. Hates the brace from hospital because it slips down. Cannot bear weight on it.  ? ? ?Patient Active Problem List  ? Diagnosis Date Noted  ? Acute pain of left knee 12/20/2021  ? Polypharmacy 12/17/2021  ? Confusion 12/17/2021  ? Hypokalemia 12/17/2021  ? Hypocalcemia 09/03/2021  ? Hypomagnesemia 09/03/2021  ? Pneumonia of left lower lobe due to infectious organism 09/03/2021  ? Hypoalbuminemia 09/03/2021  ? Pleural effusion on left 08/28/2021  ? Left-sided chest pain 08/28/2021  ? SOB (shortness of breath) 08/28/2021  ? Pulmonary embolus ([place]) 08/28/2021  ? Chronic pain of both shoulders 05/10/2021  ? Chronic pain of both knees 05/10/2021  ? Polyarthritis 05/10/2021  ? Fall 12/31/2020  ? Inattention 12/31/2020  ? Poor concentration 12/31/2020  ? Raynaud's disease without gangrene 03/29/2019  ? Post concussion syndrome 03/29/2019  ? Biceps rupture, proximal, right, initial encounter 06/03/2018  ? No energy 02/17/2018  ? S/P shoulder surgery 02/12/2018  ? Chronic left shoulder pain 02/12/2018  ? Biceps muscle tear, left, subsequent encounter 06/08/2017  ?  Complete tear of left rotator cuff 06/08/2017  ? Acute shoulder bursitis, left 04/03/2017  ? Overweight (BMI 25.0-29.9) 02/01/2017  ? 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 Generalized anxiety disorder 08/09/2016  ? Low back pain without sciatica 08/08/2016  ? Right trigger finger 05/05/2016  ? Anemia 10/26/2015  ? Metrorrhagia 09/18/2015  ? Ovarian cyst, right 09/14/2015  ? Tension headache 08/08/2014  ? Primary osteoarthritis of right hand 02/02/2014  ? Aspiration into lower respiratory tract 05/02/2013  ? Insomnia 12/24/2012  ? Depression 12/24/2012  ? Migraine 12/24/2012  ? Hyperlipidemia 12/24/2012  ? Vitamin D deficiency 12/24/2012  ? Essential hypertension, benign 12/24/2012  ? Anxiety 12/24/2012  ? S/P gastric bypass 12/24/2012  ? Increased PTH level 12/24/2012  ? GERD (gastroesophageal reflux disease) 12/24/2012  ? ?Past Medical History:  ?Diagnosis Date  ? Anemia   ? Anxiety   ? Arthritis   ? Chronic pain   ? has pain contract for Norco 5/'325mg'$   ? GERD (gastroesophageal reflux disease)   ? Hyperlipidemia   ? Hypertension   ? Insomnia   ? Pneumococcal pneumonia ([place]) 2000  ? Pneumonia   ? ?Family History  ?Problem Relation Age of Onset  ? Colon cancer Other   ?     grandmother  ? Heart attack Other   ?     grandfather  ? Hyperlipidemia Other   ? Diabetes Father   ?  father  ? Hypertension Father   ? Hypertension Mother   ? Asthma Daughter   ? ?Allergies  ?Allergen Reactions  ? Levofloxacin Hives  ? Penicillins Hives  ? Levaquin [Levofloxacin In D5w] Hives  ?  Has tolerated avelox  ? ?  ? ?ROS ? ? See hPI.  ?Objective:  ?  ? ?BP (!) 147/74   Pulse 73   Ht '5\' 5"'$  (1.651 m)   Wt 145 lb (65.8 kg)   SpO2 100%   BMI 24.13 kg/m?  ?BP Readings from Last 3 Encounters:  ?12/17/21 (!) 147/74  ?09/03/21 133/79  ?08/28/21 139/70  ? ?Wt Readings from Last 3 Encounters:  ?12/17/21 145 lb (65.8 kg)  ?09/03/21 126 lb (57.2 kg)  ?08/28/21 137 lb 1.9 oz (62.2 kg)   ? ?  ? ?Physical Exam ?Vitals reviewed.  ?Constitutional:   ?   Appearance: Normal appearance.  ?HENT:  ?   Head: Normocephalic.  ?Neck:  ?   Vascular: No carotid bruit.  ?Cardiovascular:  ?   Rate and Rhythm: Normal rate and regular rhythm.  ?   Pulses: Normal pulses.  ?   Heart sounds: Normal heart sounds.  ?Musculoskeletal:  ?   Right lower leg: No edema.  ?   Left lower leg: Edema present.  ?   Comments: Left knee to ankle swelling with tenderness over joint line.  ?Laxity of left knee noted ?ROM decreased due to pain  ?Lymphadenopathy:  ?   Cervical: No cervical adenopathy.  ?Neurological:  ?   General: No focal deficit present.  ?   Mental Status: She is alert and oriented to person, place, and time.  ?Psychiatric:     ?   Mood and Affect: Mood normal.  ? ? ? ? ?  ?Assessment & Plan:  ?..Geoffrey was seen today for knee pain. ? ?Diagnoses and all orders for this visit: ? ?Acute pain of left knee ? ?Anemia, unspecified type ?-     BASIC METABOLIC PANEL WITH GFR ?-     Fe+TIBC+Fer ? ?Hypokalemia ?-     BASIC METABOLIC PANEL WITH GFR ? ?Polypharmacy ? ?Confusion ? ?Adrenal mass ([place]) ?-     Cortisol-am, blood ?-     ACTH ? ?Essential hypertension, benign ? ?Mild episode of recurrent major depressive disorder ([place]) ?-     ARIPiprazole (ABILIFY) 10 MG tablet; Take 1 tablet (10 mg total) by mouth daily. ? ?Anxiety ?-     busPIRone (BUSPAR) 7.5 MG tablet; Take 1 tablet (7.5 mg total) by mouth 3 (three) times daily. ? ?Suspected COVID-19 virus infection ? ?Primary insomnia ?-     mirtazapine (REMERON) 7.5 MG tablet; Take 1 tablet (7.5 mg total) by mouth at bedtime. ? ?Opioid dependence, daily use ([facility]) ?-     naloxone (NARCAN) nasal spray 4 mg/0.1 mL; Place 1 spray into the nose as needed. ? ?Attention deficit disorder (ADD) without hyperactivity ?-     lisdexamfetamine (VYVANSE) 20 MG capsule; Take 1 capsule (20 mg total) by mouth every morning. ? ?Other orders ?-     Hemoglobin ? ? ?Concerned about polypharmacy and  medications causing confusion and altered mental state.  ? ?Recheck iron and potassium that was low in ER.  ? ?Pt is seeing pain clinic for pain medication ? ? ?Stop:  ?Gabapentin ?Adderall  ? ?Start  ?Vyvanse '20mg'$  ?Make sure taking remeron 7.'5mg'$  at night ? ?Decrease buspar to 7.'5mg'$  three times a day ? ?Follow up in 1 month to  address these medication changes.  ? ?Left knee and leg are swollen. Thankfully on eliquis and per patient at hospital venous u/s was negative as well as xray.  ?Pt needs MRI.  ? ? ?Spent 40 minutes with patient and patient's husband reviewing chart, discussing medication, discussing plan and symptoms.  ? ?Return in about 4 weeks (around 01/14/2022).  ? ?Iran Planas, PA-C ? ?

## 2021-12-17 NOTE — Patient Instructions (Addendum)
Stop:  ?Gabapentin ?Adderall  ? ?Start  ?Vyvanse '20mg'$  ?Make sure taking remeron 7.'5mg'$  at night ? ?Decrease buspar to 7.'5mg'$  three times a day ?

## 2021-12-18 LAB — BASIC METABOLIC PANEL WITH GFR
BUN: 13 mg/dL (ref 7–25)
CO2: 29 mmol/L (ref 20–32)
Calcium: 8.4 mg/dL — ABNORMAL LOW (ref 8.6–10.4)
Chloride: 105 mmol/L (ref 98–110)
Creat: 0.54 mg/dL (ref 0.50–1.03)
Glucose, Bld: 96 mg/dL (ref 65–99)
Potassium: 4.2 mmol/L (ref 3.5–5.3)
Sodium: 141 mmol/L (ref 135–146)
eGFR: 112 mL/min/{1.73_m2} (ref >=60)

## 2021-12-18 LAB — IRON,TIBC AND FERRITIN PANEL
%SAT: 5 % (calc) — ABNORMAL LOW (ref 16–45)
Ferritin: 15 ng/mL — ABNORMAL LOW (ref 16–232)
Iron: 18 ug/dL — ABNORMAL LOW (ref 45–160)
TIBC: 383 mcg/dL (calc) (ref 250–450)

## 2021-12-18 LAB — HEMOGLOBIN

## 2021-12-18 NOTE — Progress Notes (Signed)
Serum Iron and stores are still pretty low. Your taking iron supplement once a day? Increase to twice a day?  ? ?JJ will you add actual hemoglobin on to iron panel?  ? ?Calcium low. Add calcium '1200mg'$  a day. Recheck in 2 weeks.

## 2021-12-18 NOTE — Progress Notes (Signed)
Hemoglobin was canceled what about cbc added?

## 2021-12-19 ENCOUNTER — Other Ambulatory Visit: Payer: Self-pay | Admitting: Physician Assistant

## 2021-12-20 ENCOUNTER — Ambulatory Visit: Payer: Commercial Managed Care - PPO | Admitting: Physician Assistant

## 2021-12-20 ENCOUNTER — Encounter: Payer: Self-pay | Admitting: Physician Assistant

## 2021-12-20 DIAGNOSIS — F330 Major depressive disorder, recurrent, mild: Secondary | ICD-10-CM

## 2021-12-20 DIAGNOSIS — F5101 Primary insomnia: Secondary | ICD-10-CM

## 2021-12-20 DIAGNOSIS — R519 Headache, unspecified: Secondary | ICD-10-CM

## 2021-12-20 DIAGNOSIS — I10 Essential (primary) hypertension: Secondary | ICD-10-CM

## 2021-12-20 DIAGNOSIS — M25562 Pain in left knee: Secondary | ICD-10-CM | POA: Insufficient documentation

## 2021-12-20 DIAGNOSIS — E876 Hypokalemia: Secondary | ICD-10-CM

## 2021-12-20 DIAGNOSIS — F1120 Opioid dependence, uncomplicated: Secondary | ICD-10-CM

## 2021-12-20 DIAGNOSIS — F419 Anxiety disorder, unspecified: Secondary | ICD-10-CM

## 2021-12-20 DIAGNOSIS — R790 Abnormal level of blood mineral: Secondary | ICD-10-CM

## 2021-12-24 NOTE — Telephone Encounter (Signed)
Most have recently been ordered, but only for 30 day supply. Okay for me to reorder as a 90 day supply on all medications?  ?

## 2021-12-26 MED ORDER — SUMATRIPTAN SUCCINATE 50 MG PO TABS: 50.0000 mg | ORAL_TABLET | ORAL | 1 refills | Status: AC | PRN

## 2021-12-26 MED ORDER — NALOXONE HCL 4 MG/0.1ML NA LIQD: 1.0000 | NASAL | 1 refills | Status: AC | PRN

## 2021-12-26 MED ORDER — POTASSIUM CHLORIDE CRYS ER 20 MEQ PO TBCR: 20.0000 meq | EXTENDED_RELEASE_TABLET | Freq: Every day | ORAL | 1 refills | Status: DC

## 2021-12-26 MED ORDER — HYDROCHLOROTHIAZIDE 25 MG PO TABS: ORAL_TABLET | ORAL | 1 refills | Status: DC

## 2021-12-26 MED ORDER — VITAMIN D (ERGOCALCIFEROL) 1.25 MG (50000 UNIT) PO CAPS: ORAL_CAPSULE | ORAL | 1 refills | Status: DC

## 2021-12-26 MED ORDER — HYDROXYZINE HCL 25 MG PO TABS: 25.0000 mg | ORAL_TABLET | Freq: Two times a day (BID) | ORAL | 1 refills | Status: DC

## 2021-12-26 MED ORDER — ARIPIPRAZOLE 10 MG PO TABS: 10.0000 mg | ORAL_TABLET | Freq: Every day | ORAL | 1 refills | Status: DC

## 2021-12-26 MED ORDER — APIXABAN 5 MG PO TABS: ORAL_TABLET | ORAL | 1 refills | Status: DC

## 2021-12-26 MED ORDER — ONDANSETRON 8 MG PO TBDP: ORAL_TABLET | ORAL | 0 refills | Status: DC

## 2021-12-26 MED ORDER — MIRTAZAPINE 7.5 MG PO TABS: 7.5000 mg | ORAL_TABLET | Freq: Every day | ORAL | 1 refills | Status: DC

## 2021-12-26 NOTE — Telephone Encounter (Signed)
Prescriptions sent

## 2022-01-24 MED ORDER — METHYLPHENIDATE HCL ER (OSM) 36 MG PO TBCR: 36.0000 mg | EXTENDED_RELEASE_TABLET | Freq: Every day | ORAL | 0 refills | Status: DC

## 2022-01-24 NOTE — Addendum Note (Signed)
Addended by: Donella Stade on: 01/24/2022 02:45 PM   Modules accepted: Orders

## 2022-01-27 ENCOUNTER — Telehealth: Payer: Self-pay | Admitting: General Practice

## 2022-01-27 NOTE — Telephone Encounter (Signed)
Transition Care Management Follow-up Telephone Call Date of discharge and from where: 01/22/22 from [facility] How have you been since you were released from the hospital? Doing better but still has some head pressure.  Any questions or concerns? No  Items Reviewed: Did the pt receive and understand the discharge instructions provided? Yes  Medications obtained and verified? Yes  Other? No  Any new allergies since your discharge? No  Dietary orders reviewed? Yes Do you have support at home? Yes   Home Care and Equipment/Supplies: Were home health services ordered? no  Functional Questionnaire: (I = Independent and D = Dependent) ADLs: I  Bathing/Dressing- I  Meal Prep- I  Eating- I  Maintaining continence- I  Transferring/Ambulation- I  Managing Meds- I  Follow up appointments reviewed:  PCP Hospital f/u appt confirmed? No   Specialist Hospital f/u appt confirmed? No   Are transportation arrangements needed? No  If their condition worsens, is the pt aware to call PCP or go to the Emergency Dept.? Yes Was the patient provided with contact information for the PCP's office or ED? Yes Was to pt encouraged to call back with questions or concerns? Yes

## 2022-01-29 NOTE — Addendum Note (Signed)
Addended byAnnamaria Helling on: 01/29/2022 01:16 PM   Modules accepted: Orders

## 2022-01-31 LAB — POTASSIUM: Potassium: 4.4 mmol/L (ref 3.5–5.3)

## 2022-01-31 LAB — MAGNESIUM: Magnesium: 2 mg/dL (ref 1.5–2.5)

## 2022-01-31 NOTE — Progress Notes (Signed)
Magnesium normal. Potassium looks great.

## 2022-02-24 ENCOUNTER — Encounter: Payer: Self-pay | Admitting: Physician Assistant

## 2022-02-24 ENCOUNTER — Telehealth: Payer: Self-pay | Admitting: Physician Assistant

## 2022-02-24 DIAGNOSIS — R051 Acute cough: Secondary | ICD-10-CM

## 2022-02-24 NOTE — Progress Notes (Signed)
We cannot prescribe any narcotics or controlled substances within eVisits.  This includes cough medications that contain codeine. If you feel that you need these, a face to face with a provider is required.     If you are having a true medical emergency please call 911.     For an urgent face to face visit, [facility] has six urgent care centers for your convenience:     [facility] [place] Get Driving Directions [phone] [address] [place], [place] [address]    [facility]) Get Driving Directions [phone] [place], [place] [address]  [facility] Get Driving Directions [phone] [address] [place],  [place]  [address]  [facility] Get Driving Directions [phone] [address] [place], [place], [place] [address]   [facility] Get Driving Directions  [phone] [address] [place], [place] [address]   [facility] [place] Get Driving Directions [phone] [address], [place],  [address]  Your MyChart E-visit questionnaire answers were reviewed by a board certified advanced clinical practitioner to complete your personal care plan based on your specific symptoms.  Thank you for using e-Visits.   I provided 5 minutes of non face-to-face time during this encounter for chart review and documentation.

## 2022-03-11 ENCOUNTER — Other Ambulatory Visit: Payer: Self-pay | Admitting: Physician Assistant

## 2022-03-11 DIAGNOSIS — G8929 Other chronic pain: Secondary | ICD-10-CM

## 2022-03-11 DIAGNOSIS — M130 Polyarthritis, unspecified: Secondary | ICD-10-CM

## 2022-03-12 NOTE — Telephone Encounter (Signed)
We do not write her pain medicine and do not have her under contract I would recommend that she speak with her pain specialist.  To cover her.  We have not agreed to take back over her pain medications.

## 2022-03-12 NOTE — Telephone Encounter (Signed)
Unable to reach patient. The phone rings then goes silent.

## 2022-03-12 NOTE — Telephone Encounter (Signed)
Last OV: 12/17/21 Next OV: none on file Last RF: 05/08/21  [facility]: patient has lost their insurance and need percocet and adderall. Patient can not afford a dr visit until August 22.

## 2022-03-13 NOTE — Telephone Encounter (Signed)
Patient also left vm about this. She states she only has cell service when she is on WiFi at number [phone] and asked for [place] to leave a vm.   Called back and LVM letting her know we can not write pain medication for her since we do not have a pain contract and she is under contract with pain management. To call with any other questions.   Patient had also asked for Adderall, previously switched to Vyvanse/Concerta but can not afford without insurance. Please advise on prescribing Adderall. Last written for Adderall 15 mg XR 1 tablet daily but changed due to shortage.

## 2022-03-14 ENCOUNTER — Other Ambulatory Visit: Payer: Self-pay | Admitting: Family Medicine

## 2022-03-14 ENCOUNTER — Encounter: Payer: Self-pay | Admitting: Physician Assistant

## 2022-03-14 MED ORDER — AMPHETAMINE-DEXTROAMPHET ER 15 MG PO CP24: 15.0000 mg | ORAL_CAPSULE | ORAL | 0 refills | Status: DC

## 2022-03-14 NOTE — Telephone Encounter (Signed)
Patient states the [facility] on [address] doesn't have Adderall. She would like it sent to [facility] on [address].

## 2022-03-17 MED ORDER — AMPHETAMINE-DEXTROAMPHET ER 15 MG PO CP24
15.0000 mg | ORAL_CAPSULE | ORAL | 0 refills | Status: DC
Start: 2022-03-17 — End: 2022-05-06

## 2022-03-17 NOTE — Telephone Encounter (Signed)
Cancelled prescription.  

## 2022-04-07 ENCOUNTER — Other Ambulatory Visit: Payer: Self-pay | Admitting: Medical-Surgical

## 2022-04-08 NOTE — Telephone Encounter (Signed)
Last OV: 12/17/21 Next OV: none on file Last RF: 03/17/22

## 2022-04-11 NOTE — Telephone Encounter (Signed)
Per provider's request, spoke to the patient regarding her medications. Patient has lost her insurance last month. She is unable to afford the OOP cost for vyvanse and/or concerta. Requesting if provider can authorize the refill on the adderall rx until she gets her new insurance. Patient was made aware that she is due for a 3 months f/u. Please advise, thanks.

## 2022-05-06 ENCOUNTER — Other Ambulatory Visit: Payer: Self-pay | Admitting: Medical-Surgical

## 2022-05-06 ENCOUNTER — Encounter: Payer: Self-pay | Admitting: Physician Assistant

## 2022-05-06 DIAGNOSIS — F5101 Primary insomnia: Secondary | ICD-10-CM

## 2022-05-06 DIAGNOSIS — F330 Major depressive disorder, recurrent, mild: Secondary | ICD-10-CM

## 2022-05-06 NOTE — Telephone Encounter (Signed)
Per [facility] pharmacy and patient:   Please send in my abilify and remerol too. I cannot afford concerta or vyvanse.

## 2022-05-07 ENCOUNTER — Other Ambulatory Visit: Payer: Self-pay | Admitting: Medical-Surgical

## 2022-05-07 MED ORDER — AMPHETAMINE-DEXTROAMPHET ER 15 MG PO CP24: 15.0000 mg | ORAL_CAPSULE | ORAL | 0 refills | Status: DC

## 2022-05-07 MED ORDER — ARIPIPRAZOLE 10 MG PO TABS: 10.0000 mg | ORAL_TABLET | Freq: Every day | ORAL | 1 refills | Status: DC

## 2022-05-07 MED ORDER — MIRTAZAPINE 7.5 MG PO TABS: 7.5000 mg | ORAL_TABLET | Freq: Every day | ORAL | 1 refills | Status: DC

## 2022-05-07 NOTE — Telephone Encounter (Signed)
..  PDMP reviewed during this encounter. No concerns with fills.  Sent adderall.

## 2022-05-07 NOTE — Telephone Encounter (Signed)
Ablilify and remeron has been sent to [facility]. Patient is requesting Adderall. The Concerta and Vyvanse was too much money.

## 2022-05-12 ENCOUNTER — Encounter: Payer: Self-pay | Admitting: Physician Assistant

## 2022-05-12 MED ORDER — WARFARIN SODIUM 2.5 MG PO TABS: 2.5000 mg | ORAL_TABLET | Freq: Every day | ORAL | 0 refills | Status: DC

## 2022-05-26 ENCOUNTER — Telehealth: Payer: Self-pay | Admitting: General Practice

## 2022-05-26 ENCOUNTER — Other Ambulatory Visit: Payer: Self-pay | Admitting: Physician Assistant

## 2022-05-26 MED ORDER — AMPHETAMINE-DEXTROAMPHET ER 15 MG PO CP24: 15.0000 mg | ORAL_CAPSULE | ORAL | 0 refills | Status: DC

## 2022-05-26 NOTE — Telephone Encounter (Signed)
Can you update pharmaxy

## 2022-05-26 NOTE — Telephone Encounter (Signed)
Schedule her with [facility] for hospital follow-up.  We did go ahead and send over her Adderall at her current dose.  They can discuss further when she follows up.  Right now would not recommend making any major changes until the current issues can be addressed.

## 2022-05-26 NOTE — Telephone Encounter (Signed)
Preferred pharmacy has been updated on the rx request.

## 2022-05-26 NOTE — Telephone Encounter (Signed)
Routing to covering provider.   Last OV: 12/17/21 Next OV: no appointment scheduled Last RF: 05/07/22

## 2022-05-26 NOTE — Telephone Encounter (Signed)
Transition Care Management Unsuccessful Follow-up Telephone Call  Date of discharge and from where:  05/24/22 from [facility]  Attempts:  1st Attempt  Reason for unsuccessful TCM follow-up call:  Left voice message

## 2022-05-28 NOTE — Telephone Encounter (Signed)
Transition Care Management Unsuccessful Follow-up Telephone Call  Date of discharge and from where:  05/24/22 from [facility]  Attempts:  2nd Attempt  Reason for unsuccessful TCM follow-up call:  Left voice message

## 2022-05-30 NOTE — Telephone Encounter (Signed)
Transition Care Management Unsuccessful Follow-up Telephone Call  Date of discharge and from where:  05/24/22 from [facility]  Attempts:  3rd Attempt  Reason for unsuccessful TCM follow-up call:  Left voice message

## 2022-06-05 ENCOUNTER — Other Ambulatory Visit: Payer: Self-pay | Admitting: Physician Assistant

## 2022-06-05 NOTE — Telephone Encounter (Signed)
Are you managing this medication for patient? She has not been in for PT/INR.

## 2022-06-06 NOTE — Telephone Encounter (Signed)
LVM for patient to call back to discuss.

## 2022-06-10 ENCOUNTER — Encounter: Payer: Self-pay | Admitting: Physician Assistant

## 2022-06-13 ENCOUNTER — Encounter: Payer: Self-pay | Admitting: Physician Assistant

## 2022-06-13 ENCOUNTER — Telehealth (INDEPENDENT_AMBULATORY_CARE_PROVIDER_SITE_OTHER): Payer: Self-pay | Admitting: Physician Assistant

## 2022-06-13 VITALS — BP 135/85

## 2022-06-13 DIAGNOSIS — I82403 Acute embolism and thrombosis of unspecified deep veins of lower extremity, bilateral: Secondary | ICD-10-CM

## 2022-06-13 DIAGNOSIS — F330 Major depressive disorder, recurrent, mild: Secondary | ICD-10-CM

## 2022-06-13 DIAGNOSIS — R413 Other amnesia: Secondary | ICD-10-CM

## 2022-06-13 DIAGNOSIS — R41840 Attention and concentration deficit: Secondary | ICD-10-CM

## 2022-06-13 DIAGNOSIS — D649 Anemia, unspecified: Secondary | ICD-10-CM

## 2022-06-13 DIAGNOSIS — F419 Anxiety disorder, unspecified: Secondary | ICD-10-CM

## 2022-06-13 DIAGNOSIS — Z7901 Long term (current) use of anticoagulants: Secondary | ICD-10-CM

## 2022-06-13 DIAGNOSIS — Z980 Intestinal bypass and anastomosis status: Secondary | ICD-10-CM

## 2022-06-13 DIAGNOSIS — R5383 Other fatigue: Secondary | ICD-10-CM

## 2022-06-13 DIAGNOSIS — K8020 Calculus of gallbladder without cholecystitis without obstruction: Secondary | ICD-10-CM

## 2022-06-13 DIAGNOSIS — Z5181 Encounter for therapeutic drug level monitoring: Secondary | ICD-10-CM

## 2022-06-13 DIAGNOSIS — R790 Abnormal level of blood mineral: Secondary | ICD-10-CM

## 2022-06-13 DIAGNOSIS — D508 Other iron deficiency anemias: Secondary | ICD-10-CM

## 2022-06-13 DIAGNOSIS — E876 Hypokalemia: Secondary | ICD-10-CM

## 2022-06-13 DIAGNOSIS — F5101 Primary insomnia: Secondary | ICD-10-CM

## 2022-06-13 MED ORDER — VENLAFAXINE HCL ER 75 MG PO CP24: ORAL_CAPSULE | ORAL | 1 refills | Status: DC

## 2022-06-13 MED ORDER — WARFARIN SODIUM 2.5 MG PO TABS: 2.5000 mg | ORAL_TABLET | Freq: Every day | ORAL | 0 refills | Status: DC

## 2022-06-13 MED ORDER — HYDROXYZINE HCL 25 MG PO TABS: 25.0000 mg | ORAL_TABLET | Freq: Three times a day (TID) | ORAL | 2 refills | Status: DC | PRN

## 2022-06-13 NOTE — Patient Instructions (Addendum)
Increase iron to 3 times a day Add back in hydroxyzine up to three times a day Add back effexor 2 tablets once a day Continue with adderall '15mg'$  daily Continue warfarin 2.'5mg'$  daily  Will make referral to general surgery Will refer to vascular   Will get labs on monday

## 2022-06-13 NOTE — Progress Notes (Signed)
..Virtual Visit via Video Note  I connected with Kimberly Wade on 06/13/22 at 10:30 AM EDT by a video enabled telemedicine application and verified that I am speaking with the correct person using two identifiers.  Location: Patient: home Provider: clinic  .Marland KitchenParticipating in visit:  Patient: Kimberly Wade Provider: Iran Planas PA-C    I discussed the limitations of evaluation and management by telemedicine and the availability of in person appointments. The patient expressed understanding and agreed to proceed.  History of Present Illness: Pt is a 51 yo female who needs to follow up on medications after 05/24/2022 ED visit for bilateral DVT and epigastric pain with gallstone found. She was taken off eliquis due to cost a few weeks ago and put on warfarin. INR was 2.8 in hospital. She is on 2.'5mg'$  daily and needs refills.   She would like referral to discuss cholecystectomy. She continues to have symptoms with eating.   She continues to crave ice and feel really weak and fatigue. Her hemoglobin in hospital was 9.7. She craves ice. She struggles with memory and concentration. Her adderall was increase to '15mg'$  daily from '10mg'$  but has not noticed much of benefit. She admits she has not had effexor or hydroxyzine in a month or so.     .. Active Ambulatory Problems    Diagnosis Date Noted   Insomnia 12/24/2012   Depression 12/24/2012   Migraine 12/24/2012   Hyperlipidemia 12/24/2012   Vitamin D deficiency 12/24/2012   Essential hypertension, benign 12/24/2012   Anxiety 12/24/2012   S/P gastric bypass 12/24/2012   Increased PTH level 12/24/2012   GERD (gastroesophageal reflux disease) 12/24/2012   Aspiration into lower respiratory tract 05/02/2013   Primary osteoarthritis of right hand 02/02/2014   Tension headache 08/08/2014   Ovarian cyst, right 09/14/2015   Metrorrhagia 09/18/2015   Anemia 10/26/2015   Right trigger finger 05/05/2016   Low back pain without sciatica 08/08/2016    Generalized anxiety disorder 08/09/2016   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 01/30/2017   Overweight (BMI 25.0-29.9) 02/01/2017   Acute shoulder bursitis, left 04/03/2017   Biceps muscle tear, left, subsequent encounter 06/08/2017   Complete tear of left rotator cuff 06/08/2017   S/P shoulder surgery 02/12/2018   Chronic left shoulder pain 02/12/2018   No energy 02/17/2018   Biceps rupture, proximal, right, initial encounter 06/03/2018   Raynaud's disease without gangrene 03/29/2019   Post concussion syndrome 03/29/2019   Fall 12/31/2020   Inattention 12/31/2020   Poor concentration 12/31/2020   Chronic pain of both shoulders 05/10/2021   Chronic pain of both knees 05/10/2021   Polyarthritis 05/10/2021   Pleural effusion on left 08/28/2021   Left-sided chest pain 08/28/2021   SOB (shortness of breath) 08/28/2021   Pulmonary embolus ([place]) 08/28/2021   Hypocalcemia 09/03/2021   Hypomagnesemia 09/03/2021   Pneumonia of left lower lobe due to infectious organism 09/03/2021   Hypoalbuminemia 09/03/2021   Polypharmacy 12/17/2021   Confusion 12/17/2021   Hypokalemia 12/17/2021   Acute pain of left knee 12/20/2021   Iron deficiency anemia secondary to inadequate dietary iron intake 06/13/2022   Resolved Ambulatory Problems    Diagnosis Date Noted   Lingular pneumonia 12/24/2012   Type 2 diabetes mellitus ([place]) 12/24/2012   Sinusitis, chronic 12/24/2012   Diabetic retinopathy ([place]) 12/24/2012   Right hand pain 05/05/2016   Acute cystitis 06/18/2016   Influenza-like illness 08/29/2016   Acute non-recurrent maxillary sinusitis 10/06/2017   Past Medical History:  Diagnosis Date   Arthritis    Chronic pain    Hypertension    Pneumococcal pneumonia ([place]) 2000   Pneumonia        Observations/Objective: No acute distress Normal mood and appearance Normal breathing  .[name] Today's Vitals   06/13/22 1054  BP: 135/85   There  is no height or weight on file to calculate BMI.   ..    06/13/2022    9:59 AM 08/22/2021   11:30 AM 12/19/2020    3:23 PM 09/10/2020    9:10 AM 06/20/2019    1:50 PM  Depression screen PHQ 2/9  Decreased Interest '3 1 1 1 3  '$ Down, Depressed, Hopeless '2 1 1 1 3  '$ PHQ - 2 Score '5 2 2 2 6  '$ Altered sleeping '2 2 2 1 2  '$ Tired, decreased energy '3 3 3 1 3  '$ Change in appetite 0 '1 1 1 1  '$ Feeling bad or failure about yourself  '2 1 1 '$ 0 1  Trouble concentrating '2 3 3 3 3  '$ Moving slowly or fidgety/restless 0 0 3 3 0  Suicidal thoughts 0 0 0 0 0  PHQ-9 Score '14 12 15 11 16  '$ Difficult doing work/chores Very difficult Somewhat difficult Very difficult Very difficult Extremely dIfficult   ..    06/13/2022    9:58 AM 12/19/2020    3:23 PM 09/10/2020    9:12 AM 06/20/2019    1:50 PM  GAD 7 : Generalized Anxiety Score  Nervous, Anxious, on Edge 2 2 0 1  Control/stop worrying 2 1 0 2  Worry too much - different things 2 1 0 2  Trouble relaxing '2 2 2 3  '$ Restless 0 '2 3 1  '$ Easily annoyed or irritable '2 2 1 3  '$ Afraid - awful might happen 1 1 0 1  Total GAD 7 Score '11 11 6 13  '$ Anxiety Difficulty Very difficult Very difficult Very difficult Extremely difficult      Assessment and Plan: Marland KitchenMarland KitchenDiagnoses and all orders for this visit:  Anxiety -     hydrOXYzine (ATARAX) 25 MG tablet; Take 1 tablet (25 mg total) by mouth every 8 (eight) hours as needed. -     venlafaxine XR (EFFEXOR XR) 75 MG 24 hr capsule; Take 2 tablets once a day.  Primary insomnia -     hydrOXYzine (ATARAX) 25 MG tablet; Take 1 tablet (25 mg total) by mouth every 8 (eight) hours as needed.  Iron deficiency anemia secondary to inadequate dietary iron intake  Mild episode of recurrent major depressive disorder (HCC) -     venlafaxine XR (EFFEXOR XR) 75 MG 24 hr capsule; Take 2 tablets once a day.  Inattention  History of bypass gastroenterostomy -     warfarin (COUMADIN) 2.5 MG tablet; Take 1 tablet (2.5 mg total) by mouth  daily. -     CBC w/Diff/Platelet -     INR/PT -     COMPLETE METABOLIC PANEL WITH GFR -     Magnesium -     VITAMIN D 25 Hydroxy (Vit-D Deficiency, Fractures) -     B12 and Folate Panel -     Lipid Panel w/reflex Direct LDL -     Zinc -     Vitamin B1  DVT, lower extremity, recurrent, bilateral (HCC) -     CBC w/Diff/Platelet -     INR/PT  Memory changes -     CBC w/Diff/Platelet -     INR/PT -  COMPLETE METABOLIC PANEL WITH GFR -     Magnesium -     VITAMIN D 25 Hydroxy (Vit-D Deficiency, Fractures) -     B12 and Folate Panel -     Lipid Panel w/reflex Direct LDL -     Zinc -     Vitamin B1  No energy -     CBC w/Diff/Platelet -     INR/PT -     COMPLETE METABOLIC PANEL WITH GFR -     Magnesium -     VITAMIN D 25 Hydroxy (Vit-D Deficiency, Fractures) -     B12 and Folate Panel -     Lipid Panel w/reflex Direct LDL -     Zinc -     Vitamin B1   Needs labs especially to check INR for DVT, CBC for anemia, CMP/magnesium to evaluate weakness Referral to general surgery and vascular done Coumadin refilled today Increase iron tablets to three times a day Keep adderall the same dose Added back effexor and hydroxyzine Come in Monday for labs and 2 week follow up    Follow Up Instructions:    I discussed the assessment and treatment plan with the patient. The patient was provided an opportunity to ask questions and all were answered. The patient agreed with the plan and demonstrated an understanding of the instructions.   The patient was advised to call back or seek an in-person evaluation if the symptoms worsen or if the condition fails to improve as anticipated.   Iran Planas, PA-C

## 2022-07-17 ENCOUNTER — Other Ambulatory Visit: Payer: Self-pay | Admitting: Family Medicine

## 2022-07-18 MED ORDER — AMPHETAMINE-DEXTROAMPHET ER 15 MG PO CP24: 15.0000 mg | ORAL_CAPSULE | ORAL | 0 refills | Status: DC

## 2022-07-18 NOTE — Telephone Encounter (Signed)
Last written 05/26/2022 #30 with no refills  Last appt 06/13/2022

## 2022-07-27 ENCOUNTER — Other Ambulatory Visit: Payer: Self-pay | Admitting: Physician Assistant

## 2022-07-27 DIAGNOSIS — F419 Anxiety disorder, unspecified: Secondary | ICD-10-CM

## 2022-07-27 DIAGNOSIS — Z980 Intestinal bypass and anastomosis status: Secondary | ICD-10-CM

## 2022-07-29 MED ORDER — WARFARIN SODIUM 2.5 MG PO TABS: 2.5000 mg | ORAL_TABLET | Freq: Every day | ORAL | 0 refills | Status: DC

## 2022-07-29 MED ORDER — BUSPIRONE HCL 7.5 MG PO TABS: 7.5000 mg | ORAL_TABLET | Freq: Three times a day (TID) | ORAL | 0 refills | Status: DC

## 2022-07-29 NOTE — Telephone Encounter (Signed)
L.O.V: 06/13/22 (Vv), 12/17/21 (OV)  N.O.V: 08/01/22  L.R.F: 06/13/22 Warfarin 30 tab 0 refill (05/24/22 las PT-INR)  12/17/21 Buspirone 90 tab 0 refills

## 2022-08-01 ENCOUNTER — Telehealth (INDEPENDENT_AMBULATORY_CARE_PROVIDER_SITE_OTHER): Payer: Medicaid Other | Admitting: Physician Assistant

## 2022-08-01 VITALS — Ht 65.0 in | Wt 140.0 lb

## 2022-08-01 DIAGNOSIS — I82403 Acute embolism and thrombosis of unspecified deep veins of lower extremity, bilateral: Secondary | ICD-10-CM

## 2022-08-01 DIAGNOSIS — F419 Anxiety disorder, unspecified: Secondary | ICD-10-CM | POA: Diagnosis not present

## 2022-08-01 DIAGNOSIS — G894 Chronic pain syndrome: Secondary | ICD-10-CM | POA: Diagnosis not present

## 2022-08-01 DIAGNOSIS — F330 Major depressive disorder, recurrent, mild: Secondary | ICD-10-CM

## 2022-08-01 DIAGNOSIS — J069 Acute upper respiratory infection, unspecified: Secondary | ICD-10-CM

## 2022-08-01 DIAGNOSIS — Z980 Intestinal bypass and anastomosis status: Secondary | ICD-10-CM

## 2022-08-01 MED ORDER — BUSPIRONE HCL 7.5 MG PO TABS: 7.5000 mg | ORAL_TABLET | Freq: Three times a day (TID) | ORAL | 1 refills | Status: DC

## 2022-08-01 MED ORDER — OXYCODONE-ACETAMINOPHEN 10-325 MG PO TABS: ORAL_TABLET | ORAL | 0 refills | Status: DC

## 2022-08-01 MED ORDER — VENLAFAXINE HCL ER 75 MG PO CP24: ORAL_CAPSULE | ORAL | 5 refills | Status: DC

## 2022-08-01 MED ORDER — WARFARIN SODIUM 2.5 MG PO TABS: 2.5000 mg | ORAL_TABLET | Freq: Every day | ORAL | 0 refills | Status: DC

## 2022-08-01 NOTE — Progress Notes (Signed)
Pain doctor - gives 5 tablets a day, states has had conversation about getting through [facility] and only getting 3 a day and would like to do that   PHQ9 (6) -GAD7 (5) completed.

## 2022-08-01 NOTE — Progress Notes (Signed)
..Virtual Visit via Telephone Note  I connected with Kimberly Wade on 08/01/22 at  8:10 AM EST by telephone and verified that I am speaking with the correct person using two identifiers.  Location: Patient: home Provider: clinic  .Marland KitchenParticipating in visit:  Patient: Kimberly Wade Provider: Iran Planas PA-C    I discussed the limitations, risks, security and privacy concerns of performing an evaluation and management service by telephone and the availability of in person appointments. I also discussed with the patient that there may be a patient responsible charge related to this service. The patient expressed understanding and agreed to proceed.   History of Present Illness: Pt is a 51 yo patient with Hx of PE, DVT, HTN, chronic pain due to polyarthritis, MDD, anxiety who needs medications refilled.   Pt is taking coumadin and has not had it checked since ED about 1 month ago and per patient was 2.8 but I do not see in EMR. Per patient she is not having any leg swelling or pain currently. She is not sOB.   She did wake up this morning with headache, congestion, sinus pressure. Not tried anything.   Pt denies any CP, palpitations, headaches, vision changes.   Continues to have chronic pain but cannot go to pain clinic right now due to insurance and cost. Ok with decreasing oxycodone dose.   Anxiety and depression improving some. Needs refills of medication. No SI/HC.     [name]. Active Ambulatory Problems    Diagnosis Date Noted   Insomnia 12/24/2012   Depression 12/24/2012   Migraine 12/24/2012   Hyperlipidemia 12/24/2012   Vitamin D deficiency 12/24/2012   Essential hypertension, benign 12/24/2012   Anxiety 12/24/2012   S/P gastric bypass 12/24/2012   Increased PTH level 12/24/2012   GERD (gastroesophageal reflux disease) 12/24/2012   Aspiration into lower respiratory tract 05/02/2013   Primary osteoarthritis of right hand 02/02/2014   Tension headache 08/08/2014   Ovarian  cyst, right 09/14/2015   Metrorrhagia 09/18/2015   Anemia 10/26/2015   Right trigger finger 05/05/2016   Low back pain without sciatica 08/08/2016   Generalized anxiety disorder 08/09/2016   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 01/30/2017   Overweight (BMI 25.0-29.9) 02/01/2017   Acute shoulder bursitis, left 04/03/2017   Biceps muscle tear, left, subsequent encounter 06/08/2017   Complete tear of left rotator cuff 06/08/2017   S/P shoulder surgery 02/12/2018   Chronic left shoulder pain 02/12/2018   No energy 02/17/2018   Biceps rupture, proximal, right, initial encounter 06/03/2018   Raynaud's disease without gangrene 03/29/2019   Post concussion syndrome 03/29/2019   Fall 12/31/2020   Inattention 12/31/2020   Poor concentration 12/31/2020   Chronic pain of both shoulders 05/10/2021   Chronic pain of both knees 05/10/2021   Polyarthritis 05/10/2021   Pleural effusion on left 08/28/2021   Left-sided chest pain 08/28/2021   SOB (shortness of breath) 08/28/2021   Pulmonary embolus ([place]) 08/28/2021   Hypocalcemia 09/03/2021   Hypomagnesemia 09/03/2021   Pneumonia of left lower lobe due to infectious organism 09/03/2021   Hypoalbuminemia 09/03/2021   Polypharmacy 12/17/2021   Confusion 12/17/2021   Hypokalemia 12/17/2021   Acute pain of left knee 12/20/2021   Iron deficiency anemia secondary to inadequate dietary iron intake 06/13/2022   Calculus of gallbladder without cholecystitis without obstruction 06/13/2022   DVT, lower extremity, recurrent, bilateral ([place]) 08/01/2022   Resolved Ambulatory Problems    Diagnosis Date Noted   Lingular pneumonia 12/24/2012  Type 2 diabetes mellitus ([place]) 12/24/2012   Sinusitis, chronic 12/24/2012   Diabetic retinopathy ([place]) 12/24/2012   Right hand pain 05/05/2016   Acute cystitis 06/18/2016   Influenza-like illness 08/29/2016   Acute non-recurrent maxillary sinusitis 10/06/2017   Past  Medical History:  Diagnosis Date   Arthritis    Chronic pain    Hypertension    Pneumococcal pneumonia ([place]) 2000   Pneumonia     Observations/Objective: No acute distress Congested and hoarse sounding voice Productive cough  ..    08/01/2022    8:11 AM 06/13/2022    9:59 AM 08/22/2021   11:30 AM 12/19/2020    3:23 PM 09/10/2020    9:10 AM  Depression screen PHQ 2/9  Decreased Interest '1 3 1 1 1  '$ Down, Depressed, Hopeless '1 2 1 1 1  '$ PHQ - 2 Score '2 5 2 2 2  '$ Altered sleeping '1 2 2 2 1  '$ Tired, decreased energy '2 3 3 3 1  '$ Change in appetite 0 0 '1 1 1  '$ Feeling bad or failure about yourself  0 '2 1 1 '$ 0  Trouble concentrating '1 2 3 3 3  '$ Moving slowly or fidgety/restless 0 0 0 3 3  Suicidal thoughts 0 0 0 0 0  PHQ-9 Score '6 14 12 15 11  '$ Difficult doing work/chores Very difficult Very difficult Somewhat difficult Very difficult Very difficult   ..    08/01/2022    8:13 AM 06/13/2022    9:58 AM 12/19/2020    3:23 PM 09/10/2020    9:12 AM  GAD 7 : Generalized Anxiety Score  Nervous, Anxious, on Edge '1 2 2 '$ 0  Control/stop worrying 0 2 1 0  Worry too much - different things 0 2 1 0  Trouble relaxing '1 2 2 2  '$ Restless 0 0 2 3  Easily annoyed or irritable '3 2 2 1  '$ Afraid - awful might happen 0 1 1 0  Total GAD 7 Score '5 11 11 6  '$ Anxiety Difficulty Somewhat difficult Very difficult Very difficult Very difficult     Today's Vitals   08/01/22 0811  Weight: 140 lb (63.5 kg)  Height: '5\' 5"'$  (1.651 m)   Body mass index is 23.3 kg/m.    Assessment and Plan: Marland KitchenMarland KitchenDiagnoses and all orders for this visit:  Chronic pain syndrome -     oxyCODONE-acetaminophen (PERCOCET) 10-325 MG tablet; Take one tablet three times a day.  Anxiety -     busPIRone (BUSPAR) 7.5 MG tablet; Take 1 tablet (7.5 mg total) by mouth 3 (three) times daily. -     venlafaxine XR (EFFEXOR XR) 75 MG 24 hr capsule; Take 2 tablets once a day.  History of bypass gastroenterostomy  Mild episode of recurrent  major depressive disorder (HCC) -     venlafaxine XR (EFFEXOR XR) 75 MG 24 hr capsule; Take 2 tablets once a day.  DVT, lower extremity, recurrent, bilateral (HCC) -     warfarin (COUMADIN) 2.5 MG tablet; Take 1 tablet (2.5 mg total) by mouth daily.  Viral upper respiratory tract infection   Pt is in between pain clinic providers due to insurance .Marland KitchenPDMP reviewed during this encounter. Last oxycodone refilled via reported 04/10/2022 Discussed needs pain contract and UDS Follow up in 2-3 weeks   Discussed URI are likely viral Symptomatic treatment discussed Screen with home covid test Follow up as needed if symptoms worsen or persist  PHQ/GAD improved Buspar and effexor refilled  DVT on coumadin Come  in next 2 weeks for INR recheck and adjusement if needed   Follow Up Instructions:    I discussed the assessment and treatment plan with the patient. The patient was provided an opportunity to ask questions and all were answered. The patient agreed with the plan and demonstrated an understanding of the instructions.   The patient was advised to call back or seek an in-person evaluation if the symptoms worsen or if the condition fails to improve as anticipated.  I provided 25 minutes of non-face-to-face time during this encounter.   Iran Planas, PA-C

## 2022-08-04 ENCOUNTER — Telehealth: Payer: Self-pay | Admitting: Physician Assistant

## 2022-08-19 ENCOUNTER — Other Ambulatory Visit: Payer: Self-pay | Admitting: Family Medicine

## 2022-08-20 ENCOUNTER — Encounter: Payer: Self-pay | Admitting: Physician Assistant

## 2022-08-20 ENCOUNTER — Telehealth: Payer: Self-pay | Admitting: Physician Assistant

## 2022-08-20 MED ORDER — AMPHETAMINE-DEXTROAMPHET ER 15 MG PO CP24: 15.0000 mg | ORAL_CAPSULE | ORAL | 0 refills | Status: DC

## 2022-08-20 MED ORDER — APIXABAN 5 MG PO TABS: ORAL_TABLET | ORAL | 5 refills | Status: DC

## 2022-08-20 NOTE — Telephone Encounter (Signed)
Rx pended.  Last OV: 08/01/22 Next OV: no appt scheduled Last RF: 07/18/22

## 2022-08-20 NOTE — Telephone Encounter (Signed)
Patient also left vm requesting refill of Adderall and wanted to switch back to Eliquis as she has just been approved for Medicaid. Please advise.

## 2022-08-22 NOTE — Telephone Encounter (Signed)
Switch from coumadin to eliquis. Stop coumadin and start eliquis.

## 2022-08-29 ENCOUNTER — Ambulatory Visit: Payer: Medicaid Other | Admitting: Physician Assistant

## 2022-09-02 ENCOUNTER — Other Ambulatory Visit: Payer: Self-pay | Admitting: Physician Assistant

## 2022-09-02 ENCOUNTER — Ambulatory Visit (INDEPENDENT_AMBULATORY_CARE_PROVIDER_SITE_OTHER): Payer: Medicaid Other | Admitting: Physician Assistant

## 2022-09-02 VITALS — BP 153/82 | HR 77 | Temp 98.1°F | Ht 65.0 in | Wt 145.0 lb

## 2022-09-02 DIAGNOSIS — K8020 Calculus of gallbladder without cholecystitis without obstruction: Secondary | ICD-10-CM

## 2022-09-02 DIAGNOSIS — G894 Chronic pain syndrome: Secondary | ICD-10-CM | POA: Diagnosis not present

## 2022-09-02 DIAGNOSIS — F988 Other specified behavioral and emotional disorders with onset usually occurring in childhood and adolescence: Secondary | ICD-10-CM | POA: Diagnosis not present

## 2022-09-02 DIAGNOSIS — R300 Dysuria: Secondary | ICD-10-CM

## 2022-09-02 DIAGNOSIS — I7300 Raynaud's syndrome without gangrene: Secondary | ICD-10-CM | POA: Diagnosis not present

## 2022-09-02 DIAGNOSIS — J029 Acute pharyngitis, unspecified: Secondary | ICD-10-CM | POA: Diagnosis not present

## 2022-09-02 DIAGNOSIS — Z1231 Encounter for screening mammogram for malignant neoplasm of breast: Secondary | ICD-10-CM

## 2022-09-02 DIAGNOSIS — F330 Major depressive disorder, recurrent, mild: Secondary | ICD-10-CM

## 2022-09-02 DIAGNOSIS — R051 Acute cough: Secondary | ICD-10-CM

## 2022-09-02 DIAGNOSIS — R0982 Postnasal drip: Secondary | ICD-10-CM

## 2022-09-02 DIAGNOSIS — F419 Anxiety disorder, unspecified: Secondary | ICD-10-CM

## 2022-09-02 LAB — POCT URINALYSIS DIP (CLINITEK)
Bilirubin, UA: NEGATIVE
Glucose, UA: NEGATIVE mg/dL
Ketones, POC UA: NEGATIVE mg/dL
Nitrite, UA: NEGATIVE
POC PROTEIN,UA: NEGATIVE
Spec Grav, UA: 1.025 (ref 1.010–1.025)
Urobilinogen, UA: 0.2 E.U./dL
pH, UA: 6 (ref 5.0–8.0)

## 2022-09-02 LAB — POCT RAPID STREP A (OFFICE): Rapid Strep A Screen: NEGATIVE

## 2022-09-02 MED ORDER — LISDEXAMFETAMINE DIMESYLATE 30 MG PO CAPS: 30.0000 mg | ORAL_CAPSULE | Freq: Every day | ORAL | 0 refills | Status: DC

## 2022-09-02 MED ORDER — HYDROCOD POLI-CHLORPHE POLI ER 10-8 MG/5ML PO SUER: 5.0000 mL | Freq: Two times a day (BID) | ORAL | 0 refills | Status: DC | PRN

## 2022-09-02 MED ORDER — IPRATROPIUM BROMIDE 0.06 % NA SOLN: 2.0000 | Freq: Four times a day (QID) | NASAL | 1 refills | Status: AC

## 2022-09-02 MED ORDER — AMLODIPINE BESYLATE 2.5 MG PO TABS: 2.5000 mg | ORAL_TABLET | Freq: Every day | ORAL | 0 refills | Status: DC

## 2022-09-02 NOTE — Progress Notes (Signed)
Will culture.

## 2022-09-02 NOTE — Progress Notes (Signed)
Established Patient Office Visit  Subjective   Patient ID: Kimberly Wade, female    DOB: [birth year]  Age: 52 y.o. MRN: [id number]  Chief Complaint  Patient presents with   Follow-up    HPI Pt is a 52 yo female who presents to the clinic for follow- up.   She is going to go to the [place] [facility] for pain medications.   She has had some urinary frequency whe wants checked out. She has also has a dry cough and littl ST. Tessalon pearls do not work and cough is keeping her awake.   She got insurance again and wanting referral to general surgery.   She continues to have some post nasal drip and congestion. No fever, chills, nausea.   Her fingers turn white from time to time and get really cold and painful at times. Come back when warm.   .. Active Ambulatory Problems    Diagnosis Date Noted   Insomnia 12/24/2012   Depression 12/24/2012   Migraine 12/24/2012   Hyperlipidemia 12/24/2012   Vitamin D deficiency 12/24/2012   Essential hypertension, benign 12/24/2012   Anxiety 12/24/2012   S/P gastric bypass 12/24/2012   Increased PTH level 12/24/2012   GERD (gastroesophageal reflux disease) 12/24/2012   Aspiration into lower respiratory tract 05/02/2013   Primary osteoarthritis of right hand 02/02/2014   Tension headache 08/08/2014   Ovarian cyst, right 09/14/2015   Metrorrhagia 09/18/2015   Anemia 10/26/2015   Right trigger finger 05/05/2016   Low back pain without sciatica 08/08/2016   Generalized anxiety disorder 08/09/2016   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 01/30/2017   Overweight (BMI 25.0-29.9) 02/01/2017   Acute shoulder bursitis, left 04/03/2017   Biceps muscle tear, left, subsequent encounter 06/08/2017   Complete tear of left rotator cuff 06/08/2017   S/P shoulder surgery 02/12/2018   Chronic left shoulder pain 02/12/2018   No energy 02/17/2018   Biceps rupture, proximal, right, initial encounter  06/03/2018   Raynaud's disease without gangrene 03/29/2019   Post concussion syndrome 03/29/2019   Fall 12/31/2020   Inattention 12/31/2020   Poor concentration 12/31/2020   Chronic pain of both shoulders 05/10/2021   Chronic pain of both knees 05/10/2021   Polyarthritis 05/10/2021   Pleural effusion on left 08/28/2021   Left-sided chest pain 08/28/2021   SOB (shortness of breath) 08/28/2021   Pulmonary embolus ([place]) 08/28/2021   Hypocalcemia 09/03/2021   Hypomagnesemia 09/03/2021   Pneumonia of left lower lobe due to infectious organism 09/03/2021   Hypoalbuminemia 09/03/2021   Polypharmacy 12/17/2021   Confusion 12/17/2021   Hypokalemia 12/17/2021   Acute pain of left knee 12/20/2021   Iron deficiency anemia secondary to inadequate dietary iron intake 06/13/2022   Calculus of gallbladder without cholecystitis without obstruction 06/13/2022   DVT, lower extremity, recurrent, bilateral ([place]) 08/01/2022   Acute cough 09/02/2022   Resolved Ambulatory Problems    Diagnosis Date Noted   Lingular pneumonia 12/24/2012   Type 2 diabetes mellitus ([place]) 12/24/2012   Sinusitis, chronic 12/24/2012   Diabetic retinopathy ([place]) 12/24/2012   Right hand pain 05/05/2016   Acute cystitis 06/18/2016   Influenza-like illness 08/29/2016   Acute non-recurrent maxillary sinusitis 10/06/2017   Past Medical History:  Diagnosis Date   Arthritis    Chronic pain    Hypertension    Pneumococcal pneumonia ([place]) 2000   Pneumonia      ROS See HPI.    Objective:     BP [name])  153/82   Pulse 77   Temp 98.1 F (36.7 C) (Oral)   Ht '5\' 5"'$  (1.651 m)   Wt 145 lb (65.8 kg)   SpO2 100%   BMI 24.13 kg/m  BP Readings from Last 3 Encounters:  09/02/22 (!) 153/82  06/13/22 135/85  12/17/21 (!) 147/74   Wt Readings from Last 3 Encounters:  09/02/22 145 lb (65.8 kg)  08/01/22 140 lb (63.5 kg)  12/17/21 145 lb (65.8 kg)    .[name] Results for orders placed or performed in visit on 09/02/22  Urine  Culture   Specimen: Urine  Result Value Ref Range   MICRO NUMBER: [id number]    SPECIMEN QUALITY: Adequate    Sample Source URINE    STATUS: FINAL    ISOLATE 1: Escherichia coli (A)       Susceptibility   Escherichia coli - URINE CULTURE, REFLEX    AMOX/CLAVULANIC <=2 Sensitive     AMPICILLIN 4 Sensitive     AMPICILLIN/SULBACTAM <=2 Sensitive     CEFAZOLIN* <=4 Not Reportable      * For infections other than uncomplicated UTI caused by E. coli, K. pneumoniae or P. mirabilis: Cefazolin is resistant if MIC > or = 8 mcg/mL. (Distinguishing susceptible versus intermediate for isolates with MIC < or = 4 mcg/mL requires additional testing.) For uncomplicated UTI caused by E. coli, K. pneumoniae or P. mirabilis: Cefazolin is susceptible if MIC <32 mcg/mL and predicts susceptible to the oral agents cefaclor, cefdinir, cefpodoxime, cefprozil, cefuroxime, cephalexin and loracarbef.     CEFTAZIDIME <=1 Sensitive     CEFEPIME <=1 Sensitive     CEFTRIAXONE <=1 Sensitive     CIPROFLOXACIN <=0.25 Sensitive     LEVOFLOXACIN <=0.12 Sensitive     GENTAMICIN <=1 Sensitive     IMIPENEM <=0.25 Sensitive     NITROFURANTOIN <=16 Sensitive     PIP/TAZO <=4 Sensitive     TOBRAMYCIN <=1 Sensitive     TRIMETH/SULFA* <=20 Sensitive      * For infections other than uncomplicated UTI caused by E. coli, K. pneumoniae or P. mirabilis: Cefazolin is resistant if MIC > or = 8 mcg/mL. (Distinguishing susceptible versus intermediate for isolates with MIC < or = 4 mcg/mL requires additional testing.) For uncomplicated UTI caused by E. coli, K. pneumoniae or P. mirabilis: Cefazolin is susceptible if MIC <32 mcg/mL and predicts susceptible to the oral agents cefaclor, cefdinir, cefpodoxime, cefprozil, cefuroxime, cephalexin and loracarbef. Legend: S = Susceptible  I = Intermediate R = Resistant  NS = Not susceptible * = Not tested  NR = Not reported **NN = See antimicrobic comments   POCT rapid strep A   Result Value Ref Range   Rapid Strep A Screen Negative Negative  POCT URINALYSIS DIP (CLINITEK)  Result Value Ref Range   Color, UA yellow yellow   Clarity, UA clear clear   Glucose, UA negative negative mg/dL   Bilirubin, UA negative negative   Ketones, POC UA negative negative mg/dL   Spec Grav, UA 1.025 1.010 - 1.025   Blood, UA trace-lysed (A) negative   pH, UA 6.0 5.0 - 8.0   POC PROTEIN,UA negative negative, trace   Urobilinogen, UA 0.2 0.2 or 1.0 E.U./dL   Nitrite, UA Negative Negative   Leukocytes, UA Trace (A) Negative     Physical Exam Constitutional:      Appearance: Normal appearance.  HENT:     Head: Normocephalic.  Cardiovascular:     Rate and Rhythm: Normal rate and  regular rhythm.     Pulses: Normal pulses.     Heart sounds: Normal heart sounds.  Pulmonary:     Effort: Pulmonary effort is normal.  Musculoskeletal:     Right lower leg: No edema.     Left lower leg: No edema.  Neurological:     General: No focal deficit present.     Mental Status: She is oriented to person, place, and time.         Assessment & Plan:  Marland KitchenMarland KitchenSherah was seen today for follow-up.  Diagnoses and all orders for this visit:  Attention deficit disorder (ADD) without hyperactivity -     lisdexamfetamine (VYVANSE) 30 MG capsule; Take 1 capsule (30 mg total) by mouth daily. -     lisdexamfetamine (VYVANSE) 30 MG capsule; Take 1 capsule (30 mg total) by mouth daily. -     lisdexamfetamine (VYVANSE) 30 MG capsule; Take 1 capsule (30 mg total) by mouth daily.  Encounter for screening mammogram for malignant neoplasm of breast -     MM 3D SCREEN BREAST BILATERAL  Raynaud's disease without gangrene -     amLODipine (NORVASC) 2.5 MG tablet; Take 1 tablet (2.5 mg total) by mouth daily.  Chronic pain syndrome  Sore throat -     POCT rapid strep A  Dysuria -     Urine Culture -     POCT URINALYSIS DIP (CLINITEK)  Acute cough -     chlorpheniramine-HYDROcodone (TUSSIONEX)  10-8 MG/5ML; Take 5 mLs by mouth every 12 (twelve) hours as needed for cough (cough, will cause drowsiness.).  Post-nasal drip -     ipratropium (ATROVENT) 0.06 % nasal spray; Place 2 sprays into both nostrils 4 (four) times daily.   Refilled adderall for 3 months Trace leukocytes will culture to confirm bacteria Negative for strep Atrovent for PND Tussionex for cough Mammogram ordered Trial of norvasc for raynaud  BP elevated today so will watch, norvasc shold decrease as well.    Return in about 3 months (around 12/02/2022).    Iran Planas, PA-C

## 2022-09-03 NOTE — Progress Notes (Signed)
[name],   Protein, calcium, hemoglobin, vitamin D is iow.  You need to increase caloric intake Increase protein intake and eat 3 meals a day.  Increase calcium to at least '1300mg'$  a day.  Start iron supplement with breakfast.  Take vitamin D 2000 units daily with dairy.    Recheck labs in 3 months.

## 2022-09-04 ENCOUNTER — Encounter: Payer: Self-pay | Admitting: Physician Assistant

## 2022-09-04 LAB — URINE CULTURE
MICRO NUMBER:: 14437146
SPECIMEN QUALITY:: ADEQUATE

## 2022-09-05 ENCOUNTER — Other Ambulatory Visit: Payer: Self-pay | Admitting: Physician Assistant

## 2022-09-05 MED ORDER — SULFAMETHOXAZOLE-TRIMETHOPRIM 800-160 MG PO TABS: 1.0000 | ORAL_TABLET | Freq: Two times a day (BID) | ORAL | 0 refills | Status: DC

## 2022-09-05 NOTE — Progress Notes (Signed)
E.coli in urine. I will give abx to treat this and any potential URI.

## 2022-09-05 NOTE — Progress Notes (Signed)
Zinc is low as well. Start '50mg'$  daily.

## 2022-09-06 LAB — CBC WITH DIFFERENTIAL/PLATELET
Absolute Monocytes: 567 cells/uL (ref 200–950)
Basophils Absolute: 42 cells/uL (ref 0–200)
Basophils Relative: 0.6 %
Eosinophils Absolute: 98 cells/uL (ref 15–500)
Eosinophils Relative: 1.4 %
HCT: 29.7 % — ABNORMAL LOW (ref 35.0–45.0)
Hemoglobin: 9.6 g/dL — ABNORMAL LOW (ref 11.7–15.5)
Lymphs Abs: 2107 cells/uL (ref 850–3900)
MCH: 27 pg (ref 27.0–33.0)
MCHC: 32.3 g/dL (ref 32.0–36.0)
MCV: 83.4 fL (ref 80.0–100.0)
MPV: 10 fL (ref 7.5–12.5)
Monocytes Relative: 8.1 %
Neutro Abs: 4186 cells/uL (ref 1500–7800)
Neutrophils Relative %: 59.8 %
Platelets: 271 10*3/uL (ref 140–400)
RBC: 3.56 10*6/uL — ABNORMAL LOW (ref 3.80–5.10)
RDW: 14.5 % (ref 11.0–15.0)
Total Lymphocyte: 30.1 %
WBC: 7 10*3/uL (ref 3.8–10.8)

## 2022-09-06 LAB — COMPLETE METABOLIC PANEL WITH GFR
AG Ratio: 1.1 (calc) (ref 1.0–2.5)
ALT: 21 U/L (ref 6–29)
AST: 21 U/L (ref 10–35)
Albumin: 3.4 g/dL — ABNORMAL LOW (ref 3.6–5.1)
Alkaline phosphatase (APISO): 111 U/L (ref 37–153)
BUN: 13 mg/dL (ref 7–25)
CO2: 26 mmol/L (ref 20–32)
Calcium: 8.3 mg/dL — ABNORMAL LOW (ref 8.6–10.4)
Chloride: 108 mmol/L (ref 98–110)
Creat: 0.55 mg/dL (ref 0.50–1.03)
Globulin: 3.1 g/dL (calc) (ref 1.9–3.7)
Glucose, Bld: 77 mg/dL (ref 65–99)
Potassium: 4.1 mmol/L (ref 3.5–5.3)
Sodium: 141 mmol/L (ref 135–146)
Total Bilirubin: 0.4 mg/dL (ref 0.2–1.2)
Total Protein: 6.5 g/dL (ref 6.1–8.1)
eGFR: 111 mL/min/{1.73_m2} (ref >=60)

## 2022-09-06 LAB — VITAMIN D 25 HYDROXY (VIT D DEFICIENCY, FRACTURES): Vit D, 25-Hydroxy: 28 ng/mL — ABNORMAL LOW (ref 30–100)

## 2022-09-06 LAB — B12 AND FOLATE PANEL
Folate: 11.9 ng/mL
Vitamin B-12: 508 pg/mL (ref 200–1100)

## 2022-09-06 LAB — LIPID PANEL W/REFLEX DIRECT LDL
Cholesterol: 132 mg/dL (ref <200)
HDL: 57 mg/dL (ref >=50)
LDL Cholesterol (Calc): 61 mg/dL (calc)
Non-HDL Cholesterol (Calc): 75 mg/dL (calc) (ref <130)
Total CHOL/HDL Ratio: 2.3 (calc) (ref <5.0)
Triglycerides: 63 mg/dL (ref <150)

## 2022-09-06 LAB — PROTIME-INR
INR: 1.1
Prothrombin Time: 11.4 s (ref 9.0–11.5)

## 2022-09-06 LAB — MAGNESIUM: Magnesium: 1.8 mg/dL (ref 1.5–2.5)

## 2022-09-06 LAB — ZINC: Zinc: 57 ug/dL — ABNORMAL LOW (ref 60–130)

## 2022-09-06 LAB — VITAMIN B1: Vitamin B1 (Thiamine): 15 nmol/L (ref 8–30)

## 2022-09-15 ENCOUNTER — Encounter: Payer: Self-pay | Admitting: Physician Assistant

## 2022-09-16 ENCOUNTER — Encounter: Payer: Self-pay | Admitting: Physician Assistant

## 2022-09-16 ENCOUNTER — Other Ambulatory Visit: Payer: Self-pay | Admitting: Physician Assistant

## 2022-09-16 DIAGNOSIS — F419 Anxiety disorder, unspecified: Secondary | ICD-10-CM

## 2022-10-07 ENCOUNTER — Ambulatory Visit: Payer: Medicaid Other | Admitting: Sports Medicine

## 2022-10-14 ENCOUNTER — Encounter: Payer: Self-pay | Admitting: Physician Assistant

## 2022-10-14 DIAGNOSIS — F419 Anxiety disorder, unspecified: Secondary | ICD-10-CM

## 2022-10-14 DIAGNOSIS — F330 Major depressive disorder, recurrent, mild: Secondary | ICD-10-CM

## 2022-10-14 DIAGNOSIS — F988 Other specified behavioral and emotional disorders with onset usually occurring in childhood and adolescence: Secondary | ICD-10-CM

## 2022-10-14 MED ORDER — VYVANSE 30 MG PO CAPS: 30.0000 mg | ORAL_CAPSULE | Freq: Every day | ORAL | 0 refills | Status: DC

## 2022-11-11 ENCOUNTER — Ambulatory Visit: Payer: Medicaid Other | Admitting: Physician Assistant

## 2022-11-12 ENCOUNTER — Other Ambulatory Visit: Payer: Medicaid Other

## 2022-11-12 ENCOUNTER — Ambulatory Visit (INDEPENDENT_AMBULATORY_CARE_PROVIDER_SITE_OTHER): Payer: Medicaid Other | Admitting: Physician Assistant

## 2022-11-12 VITALS — BP 174/89 | HR 94 | Ht 65.0 in | Wt 148.0 lb

## 2022-11-12 DIAGNOSIS — F988 Other specified behavioral and emotional disorders with onset usually occurring in childhood and adolescence: Secondary | ICD-10-CM

## 2022-11-12 DIAGNOSIS — F419 Anxiety disorder, unspecified: Secondary | ICD-10-CM

## 2022-11-12 DIAGNOSIS — Z7901 Long term (current) use of anticoagulants: Secondary | ICD-10-CM

## 2022-11-12 DIAGNOSIS — N898 Other specified noninflammatory disorders of vagina: Secondary | ICD-10-CM

## 2022-11-12 DIAGNOSIS — R2242 Localized swelling, mass and lump, left lower limb: Secondary | ICD-10-CM | POA: Diagnosis not present

## 2022-11-12 DIAGNOSIS — F410 Panic disorder [episodic paroxysmal anxiety] without agoraphobia: Secondary | ICD-10-CM

## 2022-11-12 DIAGNOSIS — Z86718 Personal history of other venous thrombosis and embolism: Secondary | ICD-10-CM | POA: Diagnosis not present

## 2022-11-12 DIAGNOSIS — Z5181 Encounter for therapeutic drug level monitoring: Secondary | ICD-10-CM | POA: Insufficient documentation

## 2022-11-12 DIAGNOSIS — F5101 Primary insomnia: Secondary | ICD-10-CM

## 2022-11-12 DIAGNOSIS — F330 Major depressive disorder, recurrent, mild: Secondary | ICD-10-CM

## 2022-11-12 DIAGNOSIS — M79605 Pain in left leg: Secondary | ICD-10-CM

## 2022-11-12 MED ORDER — ARIPIPRAZOLE 15 MG PO TABS: 15.0000 mg | ORAL_TABLET | Freq: Every day | ORAL | 0 refills | Status: DC

## 2022-11-12 MED ORDER — MIRTAZAPINE 7.5 MG PO TABS: 7.5000 mg | ORAL_TABLET | Freq: Every day | ORAL | 1 refills | Status: DC

## 2022-11-12 MED ORDER — ESTRADIOL 0.1 MG/GM VA CREA: 1.0000 | TOPICAL_CREAM | Freq: Every day | VAGINAL | 12 refills | Status: DC

## 2022-11-12 MED ORDER — LISDEXAMFETAMINE DIMESYLATE 40 MG PO CAPS: 40.0000 mg | ORAL_CAPSULE | ORAL | 0 refills | Status: DC

## 2022-11-12 MED ORDER — HYDROXYZINE HCL 10 MG PO TABS
20.0000 mg | ORAL_TABLET | Freq: Once | ORAL | Status: AC
  Administered 2022-11-12: 20 mg via ORAL

## 2022-11-12 NOTE — Patient Instructions (Addendum)
Increased abilify Stay on remeron Start estrace daily for 7 days then decrease to as needed three times a week  Thrombophlebitis  Thrombophlebitis is a condition in which a blood clot and inflammation occur inside a vein. This can happen in the arms, legs, or torso. Thrombophlebitis may involve superficial veins, deep veins, or both. Superficial veins are close to the surface of the body and are part of the superficial venous system. Veins that are deeper inside the body are part of the deep venous system. When this condition happens in a superficial vein (superficial thrombophlebitis), it is usually not serious.However, when the condition happens in a vein that is part of the deep venous system (deep vein thrombosis, DVT), it can cause serious problems. What are the causes? This condition may be caused by: Infection, injury, or trauma to a vein. Inflammation of the veins. Medical conditions that can cause blood to clot more easily (hypercoagulable state). Backing up, or reflux, of blood flow through the veins (chronic venous insufficiency or venous stasis). What increases the risk? The following factors may make you more likely to develop this condition: Having a long, thin tube (catheter) put in a vein, such as a central line, port, or IV catheter. Getting certain medicines through a catheter that can irritate the vein. Pregnancy or having recently given birth. Cancer. Obesity. Taking oral contraceptive pills (OCPs) or hormone therapy (HT) medicines. Spasms of veins. Immobilization, or not moving the limbs for prolonged periods. What are the signs or symptoms? The main symptoms of this condition are: Swelling and pain in an arm or leg. If the affected vein is in the leg, you may feel pain while standing or walking. Warmth or redness in an arm or leg. Tenderness in the affected area when it is touched. Other symptoms include: Low-grade fever. Muscle aches. A bulging or hard vein  (venous distension). In some cases, there are no symptoms. How is this diagnosed? This condition may be diagnosed based on: Your symptoms and medical history. A physical exam. Tests, such as a test that uses sound waves to make images (duplex ultrasound). How is this treated? Treatment depends on how severe the condition is and which area of the body is affected. Treatment may include: Applying a warm compress or heating pad to affected areas. This may need to be repeated several times a day. Moving the affected limb. For example, you may need to start doing walking exercises right away. You will also be encouraged to continue your usual daily activities. Raising (elevating) the affected arm or leg above the level of your heart. Medicines, such as: Anti-inflammatory medicines, such as ibuprofen. Blood thinners (anticoagulants) or anti-platelet drugs such as aspirin. Removing an IV or central line that may be causing the problem. Wearing compression stockings to help prevent blood clots and reduce swelling in your legs. Follow these instructions at home: Medicines Take over-the-counter and prescription medicines only as told by your health care provider. If you are taking blood thinners: Talk with your health care provider before you take any medicines that contain aspirin or NSAIDs, such as ibuprofen. These medicines increase your risk for dangerous bleeding. Take your medicine exactly as told, at the same time every day. Avoid activities that could cause injury or bruising, and follow instructions about how to prevent falls. Wear a medical alert bracelet or carry a card that lists what medicines you take. Managing pain, stiffness, and swelling  If directed, apply heat to the affected area as often as told  by your health care provider. Use the heat source that your health care provider recommends, such as a moist heat pack or a heating pad. Place a towel between your skin and the heat  source. Leave the heat on for 20-30 minutes. Remove the heat if your skin turns bright red. This is especially important if you are unable to feel pain, heat, or cold. You have a greater risk of getting burned. Elevate the affected area above the level of your heart while you are sitting or lying down. Activity Return to your normal activities as told by your health care provider. Ask your health care provider what activities are safe for you. Avoid sitting for a long time without moving. Get up to take short walks every 1-2 hours. This is important to improve blood flow and breathing. Ask for help if you feel weak or unsteady. Do exercises as told by your health care provider. Rest as told by your health care provider. General instructions Drink enough fluid to keep your urine pale yellow. Wear compression stockings as told by your health care provider. Do not use any products that contain nicotine or tobacco. These products include cigarettes, chewing tobacco, and vaping devices, such as e-cigarettes. If you need help quitting, ask your health care provider. Keep all follow-up visits. This is important. Contact a health care provider if: You miss a dose of your blood thinner, if applicable. Your symptoms do not improve. You have unusual bruising. You have nausea, vomiting, or diarrhea that lasts for more than a day. Get help right away if: You are breathing fast or have chest pain. You have blood in your vomit, urine, or stool. You have severe pain in your affected arm or leg or new pain in any arm or leg. You have light-headedness, dizziness, a severe headache, or confusion. These symptoms may represent a serious problem that is an emergency. Do not wait to see if the symptoms will go away. Get medical help right away. Call your local emergency services (911 in the [place] Do not drive yourself to the hospital. Summary Thrombophlebitis is a condition in which a blood clot forms in a vein,  causing inflammation and often pain. This can happen in both superficial and deep veins. The main symptom of this condition is swelling and pain around the affected vein. Tenderness and redness may also be present. Treatment may include warm compresses, compression stockings, anti-inflammatory medicines, or blood thinners. Make sure you take all medicines, especially blood thinners, as instructed and keep all follow-up visits to ensure proper healing of the vein. This information is not intended to replace advice given to you by your health care provider. Make sure you discuss any questions you have with your health care provider. Document Revised: 01/28/2021 Document Reviewed: 01/28/2021 Elsevier Patient Education  [place].

## 2022-11-13 ENCOUNTER — Telehealth: Payer: Self-pay

## 2022-11-13 NOTE — Telephone Encounter (Signed)
[facility] called for directions on the Estradiol. They want to know if it is 1/2 gram or a whole gram per application.

## 2022-11-17 ENCOUNTER — Other Ambulatory Visit: Payer: Medicaid Other

## 2022-11-17 MED ORDER — ESTRADIOL 0.1 MG/GM VA CREA: 1.0000 | TOPICAL_CREAM | Freq: Every day | VAGINAL | 12 refills | Status: AC

## 2022-11-17 NOTE — Telephone Encounter (Signed)
Sent new prescription to the pharmacy.

## 2022-11-18 ENCOUNTER — Ambulatory Visit (INDEPENDENT_AMBULATORY_CARE_PROVIDER_SITE_OTHER): Payer: Medicaid Other

## 2022-11-18 ENCOUNTER — Encounter: Payer: Self-pay | Admitting: Physician Assistant

## 2022-11-18 DIAGNOSIS — M79605 Pain in left leg: Secondary | ICD-10-CM

## 2022-11-18 DIAGNOSIS — R2242 Localized swelling, mass and lump, left lower limb: Secondary | ICD-10-CM | POA: Diagnosis not present

## 2022-11-18 DIAGNOSIS — N898 Other specified noninflammatory disorders of vagina: Secondary | ICD-10-CM | POA: Insufficient documentation

## 2022-11-18 NOTE — Progress Notes (Signed)
Established Patient Office Visit  Subjective   Patient ID: Kimberly Wade, female    DOB: 08-23-1970  Age: 52 y.o. MRN: [id number]  Chief Complaint  Patient presents with   Leg Problem    HPI Pt is a 52 yo female,  who is on eliquis for recurrent DVT and PE, presents to the clinic with lower left leg swelling and pain. She has not missed any doses of eliquis. She denies any traum or injuries to legs.   She does feel like her depression medications should be increased. No SI/HC. She just feels more down and unmotivated. She also request her vyvanse increased as well. She feels like it does not last her throughout the whole day. Se is sleeping better.   Worsening vaginal dryness after menopause. Intercourse has been painful. No bleeding.   Active Ambulatory Problems    Diagnosis Date Noted   Insomnia 12/24/2012   Depression 12/24/2012   Migraine 12/24/2012   Hyperlipidemia 12/24/2012   Vitamin D deficiency 12/24/2012   Essential hypertension, benign 12/24/2012   Anxiety 12/24/2012   S/P gastric bypass 12/24/2012   Increased PTH level 12/24/2012   GERD (gastroesophageal reflux disease) 12/24/2012   Aspiration into lower respiratory tract 05/02/2013   Primary osteoarthritis of right hand 02/02/2014   Tension headache 08/08/2014   Ovarian cyst, right 09/14/2015   Metrorrhagia 09/18/2015   Anemia 10/26/2015   Right trigger finger 05/05/2016   Low back pain without sciatica 08/08/2016   Generalized anxiety disorder 08/09/2016   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 01/30/2017   Overweight (BMI 25.0-29.9) 02/01/2017   Acute shoulder bursitis, left 04/03/2017   Biceps muscle tear, left, subsequent encounter 06/08/2017   Complete tear of left rotator cuff 06/08/2017   S/P shoulder surgery 02/12/2018   Chronic left shoulder pain 02/12/2018   No energy 02/17/2018   Biceps rupture, proximal, right, initial encounter 06/03/2018    Raynaud's disease without gangrene 03/29/2019   Post concussion syndrome 03/29/2019   Fall 12/31/2020   Inattention 12/31/2020   Poor concentration 12/31/2020   Chronic pain of both shoulders 05/10/2021   Chronic pain of both knees 05/10/2021   Polyarthritis 05/10/2021   Pleural effusion on left 08/28/2021   Left-sided chest pain 08/28/2021   SOB (shortness of breath) 08/28/2021   Pulmonary embolus 08/28/2021   Hypocalcemia 09/03/2021   Hypomagnesemia 09/03/2021   Pneumonia of left lower lobe due to infectious organism 09/03/2021   Hypoalbuminemia 09/03/2021   Polypharmacy 12/17/2021   Confusion 12/17/2021   Hypokalemia 12/17/2021   Acute pain of left knee 12/20/2021   Iron deficiency anemia secondary to inadequate dietary iron intake 06/13/2022   Calculus of gallbladder without cholecystitis without obstruction 06/13/2022   DVT, lower extremity, recurrent, bilateral 08/01/2022   Acute cough 09/02/2022   Anticoagulation goal of INR 2 to 3 11/12/2022   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   Panic attacks 11/12/2022   Vaginal dryness 11/18/2022   Resolved Ambulatory Problems    Diagnosis Date Noted   Lingular pneumonia 12/24/2012   Type 2 diabetes mellitus 12/24/2012   Sinusitis, chronic 12/24/2012   Diabetic retinopathy ([place]) 12/24/2012   Right hand pain 05/05/2016   Acute cystitis 06/18/2016   Influenza-like illness 08/29/2016   Acute non-recurrent maxillary sinusitis 10/06/2017   Past Medical History:  Diagnosis Date   Arthritis    Chronic pain    Hypertension    Pneumococcal pneumonia 2000   Pneumonia  ROS See HPI.    Objective:     BP (!) 174/89   Pulse 94   Ht 5\' 5"  (1.651 m)   Wt 148 lb (67.1 kg)   SpO2 100%   BMI 24.63 kg/m  BP Readings from Last 3 Encounters:  11/12/22 (!) 174/89  09/02/22 (!) 153/82  06/13/22 135/85   Wt Readings from Last 3 Encounters:  11/12/22 148 lb (67.1 kg)  09/02/22 145 lb (65.8 kg)   08/01/22 140 lb (63.5 kg)      Physical Exam Constitutional:      Appearance: Normal appearance.  HENT:     Head: Normocephalic.  Cardiovascular:     Rate and Rhythm: Normal rate and regular rhythm.  Pulmonary:     Effort: Pulmonary effort is normal.     Breath sounds: Normal breath sounds.  Musculoskeletal:     Right lower leg: No edema.     Left lower leg: Edema present.     Comments: Left leg with 1+pitting edema and tenderness to palpation over lower leg and calf around areas of increased superficial veins No warm to touch but areas of redness  Neurological:     General: No focal deficit present.     Mental Status: She is alert and oriented to person, place, and time.  Psychiatric:        Mood and Affect: Mood normal.        The 10-year ASCVD risk score (Arnett DK, et al., 2019) is: 1.9%    Assessment & Plan:  Marland KitchenMarland KitchenAzmina was seen today for leg problem.  Diagnoses and all orders for this visit:  Localized swelling of left lower leg -     US Venous Img Lower Unilateral Left; Future  Left leg pain -     US Venous Img Lower Unilateral Left; Future  History of DVT (deep vein thrombosis)  Anxiety -     ARIPiprazole (ABILIFY) 15 MG tablet; Take 1 tablet (15 mg total) by mouth daily.  Panic attacks -     hydrOXYzine (ATARAX) tablet 20 mg  Attention deficit disorder (ADD) without hyperactivity -     lisdexamfetamine (VYVANSE) 40 MG capsule; Take 1 capsule (40 mg total) by mouth every morning.  Anticoagulated  Primary insomnia -     mirtazapine (REMERON) 7.5 MG tablet; Take 1 tablet (7.5 mg total) by mouth at bedtime.  Vaginal dryness  Mild episode of recurrent major depressive disorder (HCC) -     ARIPiprazole (ABILIFY) 15 MG tablet; Take 1 tablet (15 mg total) by mouth daily.  Other orders -     Discontinue: lisdexamfetamine (VYVANSE) 40 MG capsule; Take 1 capsule (40 mg total) by mouth every morning. -     Discontinue: estradiol (ESTRACE VAGINAL) 0.1  MG/GM vaginal cream; Place 1 Applicatorful vaginally at bedtime.   Pt is anticoagulated with eliqus but with her hx of DVT and unlateral leg swelling and pain will get stat ultrasound to look for blood clots Suspect more thrombophlebitis- elevation/compression/warm compresses  Refilled vyvanse for ADHD Trial of 40mg   Refilled remeron for sleep Increased abilify for mood  Trial of vaginal estrogen for menopausal dryness Not a candidate for HRT due to DVTs  Follow up in 3 months or sooner if needed      Iran Planas, PA-C

## 2022-11-20 ENCOUNTER — Other Ambulatory Visit: Payer: Self-pay | Admitting: Physician Assistant

## 2022-11-20 DIAGNOSIS — G894 Chronic pain syndrome: Secondary | ICD-10-CM

## 2022-11-20 MED ORDER — OXYCODONE-ACETAMINOPHEN 10-325 MG PO TABS: ORAL_TABLET | ORAL | 0 refills | Status: DC

## 2022-11-21 ENCOUNTER — Encounter: Payer: Self-pay | Admitting: Physician Assistant

## 2022-11-21 NOTE — Progress Notes (Signed)
No acute DVT.  Minimal nonocclusive chronic findings in the left popliteal vein.  Superficial thrombophlebitis as suspected and HO given at appt.  You do have some edema in the area of pain. Make sure you are using compression. If area getting warmer or red could treat for less likely abscess.   How are you feeling?

## 2022-12-02 ENCOUNTER — Ambulatory Visit (INDEPENDENT_AMBULATORY_CARE_PROVIDER_SITE_OTHER): Payer: Medicaid Other | Admitting: Physician Assistant

## 2022-12-02 ENCOUNTER — Encounter: Payer: Self-pay | Admitting: Physician Assistant

## 2022-12-02 VITALS — BP 140/81 | HR 74 | Ht 65.0 in | Wt 147.0 lb

## 2022-12-02 DIAGNOSIS — E559 Vitamin D deficiency, unspecified: Secondary | ICD-10-CM | POA: Diagnosis not present

## 2022-12-02 DIAGNOSIS — I7300 Raynaud's syndrome without gangrene: Secondary | ICD-10-CM | POA: Diagnosis not present

## 2022-12-02 DIAGNOSIS — I8000 Phlebitis and thrombophlebitis of superficial vessels of unspecified lower extremity: Secondary | ICD-10-CM | POA: Diagnosis not present

## 2022-12-02 DIAGNOSIS — F988 Other specified behavioral and emotional disorders with onset usually occurring in childhood and adolescence: Secondary | ICD-10-CM

## 2022-12-02 DIAGNOSIS — G894 Chronic pain syndrome: Secondary | ICD-10-CM | POA: Diagnosis not present

## 2022-12-02 MED ORDER — VITAMIN D (ERGOCALCIFEROL) 1.25 MG (50000 UNIT) PO CAPS: 50000.0000 [IU] | ORAL_CAPSULE | ORAL | 0 refills | Status: DC

## 2022-12-02 MED ORDER — AMLODIPINE BESYLATE 2.5 MG PO TABS: 2.5000 mg | ORAL_TABLET | Freq: Every day | ORAL | 3 refills | Status: DC

## 2022-12-02 MED ORDER — OXYCODONE HCL 15 MG PO TABS: 15.0000 mg | ORAL_TABLET | Freq: Three times a day (TID) | ORAL | 0 refills | Status: DC | PRN

## 2022-12-02 MED ORDER — AMPHETAMINE-DEXTROAMPHETAMINE 10 MG PO TABS: 10.0000 mg | ORAL_TABLET | Freq: Two times a day (BID) | ORAL | 0 refills | Status: DC

## 2022-12-02 NOTE — Patient Instructions (Signed)
Adderall to replace vyvanse Oxycodone without tylenol sent to pharmacy Once weekly vitamin D for next 8 weeks

## 2022-12-02 NOTE — Progress Notes (Signed)
Established Patient Office Visit  Subjective   Patient ID: Kimberly Wade, female    DOB: [birth year]  Age: 52 y.o. MRN: [id number]  Chief Complaint  Patient presents with   Follow-up    HPI Pt is a 52 yo female who presents to the clinic to follow up on left lower leg thrombophlebitis.   Left leg swelling and pain has improved but still present some.   Raynaud's improved with norvasc. Needs refills.   Pt does not feel like vyvanse is working as well as adderall. She only switched because of shortage. She would like to go back to adderall.   She would like to get just oxycodone without acetaminophen.  She is taking tylenol for break through pain because I have been taking her down on dosage.   .. Active Ambulatory Problems    Diagnosis Date Noted   Insomnia 12/24/2012   Depression 12/24/2012   Migraine 12/24/2012   Hyperlipidemia 12/24/2012   Vitamin D deficiency 12/24/2012   Essential hypertension, benign 12/24/2012   Anxiety 12/24/2012   S/P gastric bypass 12/24/2012   Increased PTH level 12/24/2012   GERD (gastroesophageal reflux disease) 12/24/2012   Aspiration into lower respiratory tract 05/02/2013   Primary osteoarthritis of right hand 02/02/2014   Tension headache 08/08/2014   Ovarian cyst, right 09/14/2015   Metrorrhagia 09/18/2015   Anemia 10/26/2015   Right trigger finger 05/05/2016   Low back pain without sciatica 08/08/2016   Generalized anxiety disorder 08/09/2016   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 01/30/2017   Overweight (BMI 25.0-29.9) 02/01/2017   Acute shoulder bursitis, left 04/03/2017   Biceps muscle tear, left, subsequent encounter 06/08/2017   Complete tear of left rotator cuff 06/08/2017   S/P shoulder surgery 02/12/2018   Chronic left shoulder pain 02/12/2018   No energy 02/17/2018   Biceps rupture, proximal, right, initial encounter 06/03/2018   Raynaud's disease without gangrene  03/29/2019   Post concussion syndrome 03/29/2019   Fall 12/31/2020   Inattention 12/31/2020   Poor concentration 12/31/2020   Chronic pain of both shoulders 05/10/2021   Chronic pain of both knees 05/10/2021   Polyarthritis 05/10/2021   Pleural effusion on left 08/28/2021   Left-sided chest pain 08/28/2021   SOB (shortness of breath) 08/28/2021   Pulmonary embolus 08/28/2021   Hypocalcemia 09/03/2021   Hypomagnesemia 09/03/2021   Pneumonia of left lower lobe due to infectious organism 09/03/2021   Hypoalbuminemia 09/03/2021   Polypharmacy 12/17/2021   Confusion 12/17/2021   Hypokalemia 12/17/2021   Acute pain of left knee 12/20/2021   Iron deficiency anemia secondary to inadequate dietary iron intake 06/13/2022   Calculus of gallbladder without cholecystitis without obstruction 06/13/2022   DVT, lower extremity, recurrent, bilateral 08/01/2022   Acute cough 09/02/2022   Anticoagulation goal of INR 2 to 3 11/12/2022   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   Panic attacks 11/12/2022   Vaginal dryness 11/18/2022   Thrombophlebitis of superficial vein of lower leg 12/08/2022   Chronic pain syndrome 12/08/2022   Resolved Ambulatory Problems    Diagnosis Date Noted   Lingular pneumonia 12/24/2012   Type 2 diabetes mellitus 12/24/2012   Sinusitis, chronic 12/24/2012   Diabetic retinopathy (HCC) 12/24/2012   Right hand pain 05/05/2016   Acute cystitis 06/18/2016   Influenza-like illness 08/29/2016   Acute non-recurrent maxillary sinusitis 10/06/2017   Past Medical History:  Diagnosis Date   Arthritis    Chronic pain    Hypertension  Pneumococcal pneumonia 2000   Pneumonia      ROS See HPI.    Objective:     BP (!) 140/81   Pulse 74   Ht  (1.651 m)   Wt 147 lb (66.7 kg)   SpO2 99%   BMI 24.46 kg/m  BP Readings from Last 3 Encounters:  12/02/22 (!) 140/81  11/12/22 (!) 174/89  09/02/22 (!) 153/82   Wt Readings from Last 3  Encounters:  12/02/22 147 lb (66.7 kg)  11/12/22 148 lb (67.1 kg)  09/02/22 145 lb (65.8 kg)      Physical Exam Constitutional:      Appearance: Normal appearance.  HENT:     Head: Normocephalic.  Cardiovascular:     Rate and Rhythm: Normal rate and regular rhythm.  Pulmonary:     Effort: Pulmonary effort is normal.     Breath sounds: Normal breath sounds.  Musculoskeletal:     Right lower leg: No edema.     Left lower leg: No edema.     Comments: Residual tenderness over left lateral leg in small area around varicose veins.   Neurological:     General: No focal deficit present.     Mental Status: She is alert and oriented to person, place, and time.  Psychiatric:        Mood and Affect: Mood normal.         Assessment & Plan:  Marland KitchenMarland KitchenPanayiota was seen today for follow-up.  Diagnoses and all orders for this visit:  Thrombophlebitis of superficial vein of lower leg  Raynaud's disease without gangrene -     amLODipine (NORVASC) 2.5 MG tablet; Take 1 tablet (2.5 mg total) by mouth daily.  Vitamin D deficiency -     Vitamin D, Ergocalciferol, (DRISDOL) 1.25 MG (50000 UNIT) CAPS capsule; Take 1 capsule (50,000 Units total) by mouth every 7 (seven) days. Take for 8 total doses(weeks)  Chronic pain syndrome -     oxyCODONE (ROXICODONE) 15 MG immediate release tablet; Take 1 tablet (15 mg total) by mouth every 8 (eight) hours as needed for pain.  Attention deficit disorder (ADD) without hyperactivity -     amphetamine-dextroamphetamine (ADDERALL) 10 MG tablet; Take 1 tablet (10 mg total) by mouth 2 (two) times daily.   Continue with symptomatic care, improving thrombopheblitis  Continue norvasc for raynaud, refill sent  Vitamin D high dose sent to pharmacy  .Marland KitchenPDMP reviewed during this encounter. Oxycodone sent to pharmacy without tylenol in it Pain contract signed Follow up in 3 months  Stop vyvanse trial of adderall bid Follow up in 1 month     Tandy Gaw,  PA-C

## 2022-12-08 ENCOUNTER — Encounter: Payer: Self-pay | Admitting: Physician Assistant

## 2022-12-08 DIAGNOSIS — I8000 Phlebitis and thrombophlebitis of superficial vessels of unspecified lower extremity: Secondary | ICD-10-CM | POA: Insufficient documentation

## 2022-12-08 DIAGNOSIS — G894 Chronic pain syndrome: Secondary | ICD-10-CM | POA: Insufficient documentation

## 2022-12-19 ENCOUNTER — Other Ambulatory Visit: Payer: Self-pay | Admitting: Physician Assistant

## 2022-12-19 DIAGNOSIS — F419 Anxiety disorder, unspecified: Secondary | ICD-10-CM

## 2022-12-29 ENCOUNTER — Other Ambulatory Visit: Payer: Self-pay | Admitting: Physician Assistant

## 2022-12-29 DIAGNOSIS — F988 Other specified behavioral and emotional disorders with onset usually occurring in childhood and adolescence: Secondary | ICD-10-CM

## 2022-12-30 ENCOUNTER — Ambulatory Visit: Payer: Medicaid Other | Admitting: Physician Assistant

## 2022-12-30 MED ORDER — AMPHETAMINE-DEXTROAMPHETAMINE 10 MG PO TABS: 10.0000 mg | ORAL_TABLET | Freq: Two times a day (BID) | ORAL | 0 refills | Status: DC

## 2022-12-30 MED ORDER — AMPHETAMINE-DEXTROAMPHETAMINE 10 MG PO TABS
10.0000 mg | ORAL_TABLET | Freq: Two times a day (BID) | ORAL | 0 refills | Status: DC
Start: 2023-03-01 — End: 2023-04-01

## 2022-12-30 NOTE — Telephone Encounter (Signed)
Pt doing well on IR bid adderall.  Sent refills.  Marland KitchenMarland KitchenPDMP reviewed during this encounter.

## 2023-01-02 ENCOUNTER — Ambulatory Visit: Payer: Medicaid Other | Admitting: Physician Assistant

## 2023-01-05 ENCOUNTER — Ambulatory Visit: Payer: Medicaid Other | Admitting: Physician Assistant

## 2023-01-13 ENCOUNTER — Other Ambulatory Visit: Payer: Self-pay | Admitting: Physician Assistant

## 2023-01-13 DIAGNOSIS — G894 Chronic pain syndrome: Secondary | ICD-10-CM

## 2023-01-14 ENCOUNTER — Other Ambulatory Visit: Payer: Self-pay | Admitting: Physician Assistant

## 2023-01-14 DIAGNOSIS — G894 Chronic pain syndrome: Secondary | ICD-10-CM

## 2023-01-15 MED ORDER — OXYCODONE HCL 15 MG PO TABS
15.0000 mg | ORAL_TABLET | Freq: Three times a day (TID) | ORAL | 0 refills | Status: DC | PRN
Start: 2023-01-15 — End: 2023-02-12

## 2023-02-12 ENCOUNTER — Other Ambulatory Visit: Payer: Self-pay | Admitting: Family Medicine

## 2023-02-12 DIAGNOSIS — G894 Chronic pain syndrome: Secondary | ICD-10-CM

## 2023-02-13 ENCOUNTER — Telehealth: Payer: Self-pay | Admitting: Physician Assistant

## 2023-02-13 MED ORDER — OXYCODONE HCL 15 MG PO TABS
15.0000 mg | ORAL_TABLET | Freq: Three times a day (TID) | ORAL | 0 refills | Status: DC | PRN
Start: 2023-02-15 — End: 2023-03-16

## 2023-02-13 NOTE — Telephone Encounter (Signed)
..  PDMP reviewed during this encounter. Post dated for 02/15/2023.  Follow up in july

## 2023-02-13 NOTE — Telephone Encounter (Signed)
Patient is calling about refills on OxyCODONE (ROXICODONE) 15 MG immediate release tablet  She is out please advise [id number] [place], [place] - [address]

## 2023-02-13 NOTE — Telephone Encounter (Signed)
Spoke with pharmacy and informed that due to traveling could fill oxycodone 15mg  2 days early. Pharmacist states she will get this ready for patient.  Called patient and left a detailed voice mail message ( allowed onDPR)

## 2023-02-16 ENCOUNTER — Ambulatory Visit: Payer: Medicaid Other | Admitting: Physician Assistant

## 2023-02-25 ENCOUNTER — Other Ambulatory Visit: Payer: Self-pay | Admitting: Physician Assistant

## 2023-02-25 DIAGNOSIS — F419 Anxiety disorder, unspecified: Secondary | ICD-10-CM

## 2023-03-16 ENCOUNTER — Other Ambulatory Visit: Payer: Self-pay | Admitting: Physician Assistant

## 2023-03-16 DIAGNOSIS — G894 Chronic pain syndrome: Secondary | ICD-10-CM

## 2023-03-17 MED ORDER — OXYCODONE HCL 15 MG PO TABS
15.0000 mg | ORAL_TABLET | Freq: Three times a day (TID) | ORAL | 0 refills | Status: DC | PRN
Start: 2023-03-17 — End: 2023-04-22

## 2023-03-17 NOTE — Telephone Encounter (Signed)
Patient schld for 04/01/23 @ 7:50 am=kph

## 2023-03-18 ENCOUNTER — Other Ambulatory Visit: Payer: Self-pay | Admitting: Physician Assistant

## 2023-03-18 DIAGNOSIS — F419 Anxiety disorder, unspecified: Secondary | ICD-10-CM

## 2023-03-18 DIAGNOSIS — E559 Vitamin D deficiency, unspecified: Secondary | ICD-10-CM

## 2023-03-18 DIAGNOSIS — F330 Major depressive disorder, recurrent, mild: Secondary | ICD-10-CM

## 2023-04-01 ENCOUNTER — Encounter: Payer: Self-pay | Admitting: Physician Assistant

## 2023-04-01 ENCOUNTER — Ambulatory Visit (INDEPENDENT_AMBULATORY_CARE_PROVIDER_SITE_OTHER): Payer: Medicaid Other | Admitting: Physician Assistant

## 2023-04-01 VITALS — BP 127/83 | HR 82 | Ht 65.0 in | Wt 150.0 lb

## 2023-04-01 DIAGNOSIS — Z86718 Personal history of other venous thrombosis and embolism: Secondary | ICD-10-CM

## 2023-04-01 DIAGNOSIS — M79605 Pain in left leg: Secondary | ICD-10-CM

## 2023-04-01 DIAGNOSIS — F988 Other specified behavioral and emotional disorders with onset usually occurring in childhood and adolescence: Secondary | ICD-10-CM | POA: Diagnosis not present

## 2023-04-01 DIAGNOSIS — I82532 Chronic embolism and thrombosis of left popliteal vein: Secondary | ICD-10-CM

## 2023-04-01 DIAGNOSIS — Z79899 Other long term (current) drug therapy: Secondary | ICD-10-CM

## 2023-04-01 DIAGNOSIS — G894 Chronic pain syndrome: Secondary | ICD-10-CM | POA: Diagnosis not present

## 2023-04-01 DIAGNOSIS — Z1231 Encounter for screening mammogram for malignant neoplasm of breast: Secondary | ICD-10-CM

## 2023-04-01 DIAGNOSIS — R2242 Localized swelling, mass and lump, left lower limb: Secondary | ICD-10-CM | POA: Diagnosis not present

## 2023-04-01 DIAGNOSIS — Z1211 Encounter for screening for malignant neoplasm of colon: Secondary | ICD-10-CM

## 2023-04-01 DIAGNOSIS — Z23 Encounter for immunization: Secondary | ICD-10-CM | POA: Diagnosis not present

## 2023-04-01 MED ORDER — AMPHETAMINE-DEXTROAMPHETAMINE 20 MG PO TABS: 20.0000 mg | ORAL_TABLET | Freq: Two times a day (BID) | ORAL | 0 refills | Status: DC

## 2023-04-01 MED ORDER — HYDROCHLOROTHIAZIDE 12.5 MG PO TABS
12.5000 mg | ORAL_TABLET | Freq: Every day | ORAL | 2 refills | Status: DC
Start: 2023-04-01 — End: 2023-07-02

## 2023-04-01 NOTE — Progress Notes (Signed)
Established Patient Office Visit  Subjective   Patient ID: Kimberly Wade, female    DOB: 01/07/1971  Age: 52 y.o. MRN: [id number]  Chief Complaint  Patient presents with   Medical Management of Chronic Issues    Restless leg syndrome    HPI Pt is a 52 yo female who presents to the clinic for follow up.   She would like any immunizations that she needs.   She needs refills on adderall and oxycodone. Doing well no concerns.   Continues to have left leg swelling, pain, aching. Last u/s was April 2024 showing possible chronic DVT and thrombophlebitis. She is compressing and finished abx. On anticoagulation.   .. Active Ambulatory Problems    Diagnosis Date Noted   Insomnia 12/24/2012   Depression 12/24/2012   Migraine 12/24/2012   Hyperlipidemia 12/24/2012   Vitamin D deficiency 12/24/2012   Essential hypertension, benign 12/24/2012   Anxiety 12/24/2012   S/P gastric bypass 12/24/2012   Increased PTH level 12/24/2012   GERD (gastroesophageal reflux disease) 12/24/2012   Aspiration into lower respiratory tract 05/02/2013   Primary osteoarthritis of right hand 02/02/2014   Tension headache 08/08/2014   Ovarian cyst, right 09/14/2015   Metrorrhagia 09/18/2015   Anemia 10/26/2015   Right trigger finger 05/05/2016   Low back pain without sciatica 08/08/2016   Generalized anxiety disorder 08/09/2016   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 01/30/2017   Overweight (BMI 25.0-29.9) 02/01/2017   Acute shoulder bursitis, left 04/03/2017   Biceps muscle tear, left, subsequent encounter 06/08/2017   Complete tear of left rotator cuff 06/08/2017   S/P shoulder surgery 02/12/2018   Chronic left shoulder pain 02/12/2018   No energy 02/17/2018   Biceps rupture, proximal, right, initial encounter 06/03/2018   Raynaud's disease without gangrene 03/29/2019   Post concussion syndrome 03/29/2019   Fall 12/31/2020   Inattention 12/31/2020    Poor concentration 12/31/2020   Chronic pain of both shoulders 05/10/2021   Chronic pain of both knees 05/10/2021   Polyarthritis 05/10/2021   Pleural effusion on left 08/28/2021   Left-sided chest pain 08/28/2021   SOB (shortness of breath) 08/28/2021   Pulmonary embolus (HCC) 08/28/2021   Hypocalcemia 09/03/2021   Hypomagnesemia 09/03/2021   Pneumonia of left lower lobe due to infectious organism 09/03/2021   Hypoalbuminemia 09/03/2021   Polypharmacy 12/17/2021   Confusion 12/17/2021   Hypokalemia 12/17/2021   Acute pain of left knee 12/20/2021   Iron deficiency anemia secondary to inadequate dietary iron intake 06/13/2022   Calculus of gallbladder without cholecystitis without obstruction 06/13/2022   DVT, lower extremity, recurrent, bilateral (HCC) 08/01/2022   Acute cough 09/02/2022   Anticoagulation goal of INR 2 to 3 11/12/2022   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   Panic attacks 11/12/2022   Vaginal dryness 11/18/2022   Thrombophlebitis of superficial vein of lower leg 12/08/2022   Chronic pain syndrome 12/08/2022   Localized swelling of left lower leg 04/01/2023   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    Objective:     BP 127/83   Pulse 82   Ht 5\' 5"  (1.651 m)   Wt 150 lb (68 kg)   SpO2 99%   BMI 24.96 kg/m  BP Readings from Last 3 Encounters:  04/01/23  127/83  12/02/22 (!) 140/81  11/12/22 (!) 174/89   Wt Readings from Last 3 Encounters:  04/01/23 150 lb (68 kg)  12/02/22 147 lb (66.7 kg)  11/12/22 148 lb (67.1 kg)   ..    04/01/2023    7:51 AM 11/12/2022   11:08 AM 09/02/2022    9:03 AM 08/01/2022    8:11 AM 06/13/2022    9:59 AM  Depression screen PHQ 2/9  Decreased Interest 0 2 3 1 3   Down, Depressed, Hopeless 0 2 2 1 2   PHQ - 2 Score 0 4 5 2 5   Altered sleeping  1 3 1 2   Tired, decreased energy  3 3 2 3   Change in appetite  3 1 0 0  Feeling bad or failure about yourself   2 1 0 2  Trouble concentrating  1 3 1 2   Moving slowly or fidgety/restless  1 1 0 0  Suicidal thoughts  1 1 0 0  PHQ-9 Score  16 18 6 14   Difficult doing work/chores  Extremely dIfficult Extremely dIfficult Very difficult Very difficult   .[name]    11/12/2022   11:08 AM 09/02/2022    9:03 AM 08/01/2022    8:13 AM 06/13/2022    9:58 AM  GAD 7 : Generalized Anxiety Score  Nervous, Anxious, on Edge 2 2 1 2   Control/stop worrying 2 3 0 2  Worry too much - different things 2 3 0 2  Trouble relaxing 2 1 1 2   Restless 1 1 0 0  Easily annoyed or irritable 3 3 3 2   Afraid - awful might happen 0 1 0 1  Total GAD 7 Score 12 14 5 11   Anxiety Difficulty Extremely difficult Extremely difficult Somewhat difficult Very difficult        Physical Exam Constitutional:      Appearance: Normal appearance.  HENT:     Head: Normocephalic.  Cardiovascular:     Rate and Rhythm: Normal rate and regular rhythm.  Pulmonary:     Effort: Pulmonary effort is normal.  Musculoskeletal:     Comments: Left leg from calf to foot swollen more than right 1+  Neurological:     General: No focal deficit present.     Mental Status: She is alert and oriented to person, place, and time.      The 10-year ASCVD risk score (Arnett DK, et al., 2019) is: 1.1%    Assessment & Plan:  Marland KitchenMarland KitchenMargaret "Chyrl Civatte" was seen today for medical management of chronic issues.  Diagnoses and  all orders for this visit:  Chronic pain syndrome -     oxyCODONE (ROXICODONE) 15 MG immediate release tablet; Take 1 tablet (15 mg total) by mouth every 8 (eight) hours as needed for pain.  Left leg pain -     Ambulatory referral to Vascular Surgery  Visit for screening mammogram -     MM 3D SCREENING MAMMOGRAM BILATERAL BREAST; Future  Colon cancer screening -     Ambulatory referral to Gastroenterology  Medication management -  Basic metabolic panel  Localized swelling of left lower leg -     hydrochlorothiazide (HYDRODIURIL) 12.5 MG tablet; Take 1 tablet (12.5 mg total) by mouth daily. -     Ambulatory referral to Vascular Surgery  Need for shingles vaccine -     Zoster Recombinant (Shingrix )  Need for influenza vaccination -     Flu Vaccine QUAD 6+ mos PF IM (Fluarix Quad PF)  Attention deficit disorder (ADD) without hyperactivity -     amphetamine-dextroamphetamine (ADDERALL) 20 MG tablet; Take 1 tablet (20 mg total) by mouth 2 (two) times daily. -     amphetamine-dextroamphetamine (ADDERALL) 20 MG tablet; Take 1 tablet (20 mg total) by mouth 2 (two) times daily. -     amphetamine-dextroamphetamine (ADDERALL) 20 MG tablet; Take 1 tablet (20 mg total) by mouth 2 (two) times daily.  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  Referral to vein clinic April U/S doppler showed no acute DVT ? Chronic DVT in left popliteal vein/superficial thrombophlebitis/edema On anticoagulation Hydrochlorothiazide for swelling Trial of requip for RLS symptoms  Adderall refilled for 3 months  .Marland KitchenPDMP reviewed during this encounter. 7/31- oxycodone refilled.  Pain contract UTD Refilled oxycodone  Return in about  3 months (around 07/02/2023).    Tandy Gaw, PA-C

## 2023-04-01 NOTE — Patient Instructions (Addendum)
Will make referral to vein clinic Start hydrochlorothiazide daily for left leg swelling

## 2023-04-03 MED ORDER — ROPINIROLE HCL 0.25 MG PO TABS: 0.2500 mg | ORAL_TABLET | Freq: Three times a day (TID) | ORAL | 1 refills | Status: DC

## 2023-04-07 ENCOUNTER — Encounter: Payer: Self-pay | Admitting: Physician Assistant

## 2023-04-18 ENCOUNTER — Other Ambulatory Visit: Payer: Self-pay | Admitting: Physician Assistant

## 2023-04-18 DIAGNOSIS — G894 Chronic pain syndrome: Secondary | ICD-10-CM

## 2023-04-21 ENCOUNTER — Other Ambulatory Visit: Payer: Self-pay | Admitting: Physician Assistant

## 2023-04-21 DIAGNOSIS — G894 Chronic pain syndrome: Secondary | ICD-10-CM

## 2023-04-22 DIAGNOSIS — I82532 Chronic embolism and thrombosis of left popliteal vein: Secondary | ICD-10-CM | POA: Insufficient documentation

## 2023-04-22 DIAGNOSIS — M79605 Pain in left leg: Secondary | ICD-10-CM | POA: Insufficient documentation

## 2023-04-22 MED ORDER — OXYCODONE HCL 15 MG PO TABS: 15.0000 mg | ORAL_TABLET | Freq: Three times a day (TID) | ORAL | 0 refills | Status: DC | PRN

## 2023-05-19 ENCOUNTER — Other Ambulatory Visit: Payer: Self-pay | Admitting: Physician Assistant

## 2023-05-19 DIAGNOSIS — G894 Chronic pain syndrome: Secondary | ICD-10-CM

## 2023-05-21 ENCOUNTER — Other Ambulatory Visit: Payer: Self-pay | Admitting: Physician Assistant

## 2023-05-21 ENCOUNTER — Encounter: Payer: Self-pay | Admitting: Physician Assistant

## 2023-05-21 DIAGNOSIS — G894 Chronic pain syndrome: Secondary | ICD-10-CM

## 2023-05-22 MED ORDER — OXYCODONE HCL 15 MG PO TABS: 15.0000 mg | ORAL_TABLET | Freq: Three times a day (TID) | ORAL | 0 refills | Status: DC | PRN

## 2023-05-22 NOTE — Telephone Encounter (Signed)
..  PDMP reviewed during this encounter. No concerns Pain contract UTD OV with in 3 months

## 2023-05-22 NOTE — Telephone Encounter (Signed)
Patient scheduled for 3 month f/u  visit=kph

## 2023-06-19 ENCOUNTER — Other Ambulatory Visit: Payer: Self-pay | Admitting: Physician Assistant

## 2023-06-19 DIAGNOSIS — G894 Chronic pain syndrome: Secondary | ICD-10-CM

## 2023-06-19 MED ORDER — OXYCODONE HCL 15 MG PO TABS
15.0000 mg | ORAL_TABLET | Freq: Three times a day (TID) | ORAL | 0 refills | Status: DC | PRN
Start: 2023-06-22 — End: 2023-07-07

## 2023-06-19 NOTE — Telephone Encounter (Signed)
Patient scheduled.

## 2023-06-19 NOTE — Telephone Encounter (Signed)
..  PDMP reviewed during this encounter. Sent refill for this month post dated to 11/4 but patient needs to be seen every 3 months via pain contract and will need appt within the month.

## 2023-06-26 ENCOUNTER — Other Ambulatory Visit: Payer: Self-pay | Admitting: Physician Assistant

## 2023-06-26 DIAGNOSIS — F988 Other specified behavioral and emotional disorders with onset usually occurring in childhood and adolescence: Secondary | ICD-10-CM

## 2023-06-26 DIAGNOSIS — F419 Anxiety disorder, unspecified: Secondary | ICD-10-CM

## 2023-06-29 ENCOUNTER — Encounter: Payer: Self-pay | Admitting: Physician Assistant

## 2023-06-29 MED ORDER — AMPHETAMINE-DEXTROAMPHETAMINE 20 MG PO TABS
20.0000 mg | ORAL_TABLET | Freq: Two times a day (BID) | ORAL | 0 refills | Status: DC
Start: 2023-07-01 — End: 2023-07-07

## 2023-06-29 NOTE — Telephone Encounter (Signed)
..  PDMP reviewed during this encounter. No concerns.  Needs to schedule 3 month appt.

## 2023-07-02 ENCOUNTER — Ambulatory Visit (INDEPENDENT_AMBULATORY_CARE_PROVIDER_SITE_OTHER): Payer: Medicaid Other | Admitting: Family Medicine

## 2023-07-02 ENCOUNTER — Ambulatory Visit: Payer: Medicaid Other

## 2023-07-02 ENCOUNTER — Encounter: Payer: Self-pay | Admitting: Family Medicine

## 2023-07-02 VITALS — BP 159/79 | HR 85 | Ht 60.0 in | Wt 150.0 lb

## 2023-07-02 DIAGNOSIS — R059 Cough, unspecified: Secondary | ICD-10-CM | POA: Diagnosis not present

## 2023-07-02 DIAGNOSIS — R079 Chest pain, unspecified: Secondary | ICD-10-CM | POA: Diagnosis not present

## 2023-07-02 DIAGNOSIS — I10 Essential (primary) hypertension: Secondary | ICD-10-CM

## 2023-07-02 DIAGNOSIS — R051 Acute cough: Secondary | ICD-10-CM

## 2023-07-02 DIAGNOSIS — R0602 Shortness of breath: Secondary | ICD-10-CM

## 2023-07-02 DIAGNOSIS — R2242 Localized swelling, mass and lump, left lower limb: Secondary | ICD-10-CM

## 2023-07-02 LAB — POC COVID19 BINAXNOW: SARS Coronavirus 2 Ag: NEGATIVE

## 2023-07-02 LAB — POCT INFLUENZA A/B
Influenza A, POC: NEGATIVE
Influenza B, POC: NEGATIVE

## 2023-07-02 MED ORDER — AZITHROMYCIN 250 MG PO TABS: ORAL_TABLET | ORAL | 0 refills | Status: DC

## 2023-07-02 MED ORDER — HYDROCHLOROTHIAZIDE 25 MG PO TABS: 25.0000 mg | ORAL_TABLET | Freq: Every day | ORAL | 0 refills | Status: DC

## 2023-07-02 NOTE — Progress Notes (Signed)
Acute Office Visit  Subjective:     Patient ID: Kimberly Wade, female    DOB: 07-17-1971, 52 y.o.   MRN: [id number]  Chief Complaint  Patient presents with   Cough    Cough , sob , rt side chest pain, onset 4 days     HPI Patient is in today for cough, right anterior chest pain, and SOB x 4 days.  She has had pneumonia multiple times she was actually hospitalized for pneumococcal pneumonia and ended up in the ICU several years ago.  She says this feels similar.  She is also had a prior history of pulmonary embolism.  The cough is mostly dry.  No GI symptoms.  A little bit of postnasal drip but no significant sinus congestion.  No fevers chills.  ROS      Objective:    BP (!) 159/79   Pulse 85   Ht 5' (1.524 m)   Wt 150 lb (68 kg)   SpO2 98%   BMI 29.29 kg/m    Physical Exam Constitutional:      Appearance: Normal appearance.  HENT:     Head: Normocephalic and atraumatic.     Right Ear: Tympanic membrane, ear canal and external ear normal. There is no impacted cerumen.     Left Ear: Tympanic membrane, ear canal and external ear normal. There is no impacted cerumen.     Nose: Nose normal.     Mouth/Throat:     Pharynx: Oropharynx is clear.  Eyes:     Conjunctiva/sclera: Conjunctivae normal.  Cardiovascular:     Rate and Rhythm: Normal rate and regular rhythm.  Pulmonary:     Effort: Pulmonary effort is normal.     Breath sounds: Normal breath sounds.  Musculoskeletal:     Cervical back: Neck supple. No tenderness.  Lymphadenopathy:     Cervical: No cervical adenopathy.  Skin:    General: Skin is warm and dry.  Neurological:     Mental Status: She is alert and oriented to person, place, and time.  Psychiatric:        Mood and Affect: Mood normal.     Results for orders placed or performed in visit on 07/02/23  POCT Influenza A/B  Result Value Ref Range   Influenza A, POC Negative Negative   Influenza B, POC Negative Negative  POC COVID-19  Result  Value Ref Range   SARS Coronavirus 2 Ag Negative Negative        Assessment & Plan:   Problem List Items Addressed This Visit       Cardiovascular and Mediastinum   Essential hypertension, benign (Chronic)    Blood pressure is uncontrolled.  The last 4 blood pressures recorded in our office over the last year have been very high and similar to today.  Will increase HCTZ to 25 mg she also has some room to go up on her on her amlodipine if needed but we will start just by making 1 change and then have her follow-up in 2 weeks for blood pressure and BMP.      Relevant Medications   hydrochlorothiazide (HYDRODIURIL) 25 MG tablet     Other   SOB (shortness of breath)   Relevant Orders   POCT Influenza A/B (Completed)   POC COVID-19 (Completed)   DG Chest 2 View   Localized swelling of left lower leg   Relevant Medications   hydrochlorothiazide (HYDRODIURIL) 25 MG tablet   Acute cough - Primary   Relevant  Orders   POCT Influenza A/B (Completed)   POC COVID-19 (Completed)   DG Chest 2 View   Possible pneumonia - neg for flu and COVID. Will treat for possible mycoplasma since circulating in the community.  Zpack. Call if not improving.  CXR results pending.   Meds ordered this encounter  Medications   hydrochlorothiazide (HYDRODIURIL) 25 MG tablet    Sig: Take 1 tablet (25 mg total) by mouth daily.    Dispense:  90 tablet    Refill:  0   azithromycin (ZITHROMAX) 250 MG tablet    Sig: 2 Ttabs PO on Day 1, then one a day x 4 days.    Dispense:  6 tablet    Refill:  0    Return in about 2 weeks (around 07/16/2023) for Hypertension with PCP, Needs BMP.  Nani Gasser, MD

## 2023-07-02 NOTE — Progress Notes (Signed)
Hi Kimberly Wade great news no sign of pneumonia there is just a little bit of thickening of the bronchial tubes.  I did go ahead and send over azithromycin for you to get started on.  Let us know if you are not feeling better after the weekend.  Make sure to stay well-hydrated.

## 2023-07-02 NOTE — Assessment & Plan Note (Signed)
Blood pressure is uncontrolled.  The last 4 blood pressures recorded in our office over the last year have been very high and similar to today.  Will increase HCTZ to 25 mg she also has some room to go up on her on her amlodipine if needed but we will start just by making 1 change and then have her follow-up in 2 weeks for blood pressure and BMP.

## 2023-07-07 ENCOUNTER — Ambulatory Visit (INDEPENDENT_AMBULATORY_CARE_PROVIDER_SITE_OTHER): Payer: Medicaid Other | Admitting: Physician Assistant

## 2023-07-07 ENCOUNTER — Encounter: Payer: Self-pay | Admitting: Physician Assistant

## 2023-07-07 VITALS — BP 133/82 | HR 80 | Ht 60.0 in | Wt 154.0 lb

## 2023-07-07 DIAGNOSIS — Z800 Family history of malignant neoplasm of digestive organs: Secondary | ICD-10-CM

## 2023-07-07 DIAGNOSIS — G894 Chronic pain syndrome: Secondary | ICD-10-CM | POA: Diagnosis not present

## 2023-07-07 DIAGNOSIS — I82403 Acute embolism and thrombosis of unspecified deep veins of lower extremity, bilateral: Secondary | ICD-10-CM

## 2023-07-07 DIAGNOSIS — Z79899 Other long term (current) drug therapy: Secondary | ICD-10-CM | POA: Diagnosis not present

## 2023-07-07 DIAGNOSIS — Z1211 Encounter for screening for malignant neoplasm of colon: Secondary | ICD-10-CM

## 2023-07-07 DIAGNOSIS — F5101 Primary insomnia: Secondary | ICD-10-CM

## 2023-07-07 DIAGNOSIS — F419 Anxiety disorder, unspecified: Secondary | ICD-10-CM

## 2023-07-07 DIAGNOSIS — F330 Major depressive disorder, recurrent, mild: Secondary | ICD-10-CM

## 2023-07-07 DIAGNOSIS — F988 Other specified behavioral and emotional disorders with onset usually occurring in childhood and adolescence: Secondary | ICD-10-CM | POA: Diagnosis not present

## 2023-07-07 DIAGNOSIS — I10 Essential (primary) hypertension: Secondary | ICD-10-CM

## 2023-07-07 MED ORDER — ARIPIPRAZOLE 15 MG PO TABS: 15.0000 mg | ORAL_TABLET | Freq: Every day | ORAL | 0 refills | Status: DC

## 2023-07-07 MED ORDER — AMPHETAMINE-DEXTROAMPHET ER 20 MG PO CP24: 20.0000 mg | ORAL_CAPSULE | ORAL | 0 refills | Status: DC

## 2023-07-07 MED ORDER — AMPHETAMINE-DEXTROAMPHETAMINE 20 MG PO TABS: ORAL_TABLET | ORAL | 0 refills | Status: DC

## 2023-07-07 MED ORDER — MIRTAZAPINE 7.5 MG PO TABS: 7.5000 mg | ORAL_TABLET | Freq: Every day | ORAL | 1 refills | Status: DC

## 2023-07-07 MED ORDER — OXYCODONE HCL 15 MG PO TABS: 15.0000 mg | ORAL_TABLET | Freq: Three times a day (TID) | ORAL | 0 refills | Status: DC | PRN

## 2023-07-07 MED ORDER — VENLAFAXINE HCL ER 75 MG PO CP24: ORAL_CAPSULE | ORAL | 1 refills | Status: DC

## 2023-07-07 MED ORDER — APIXABAN 5 MG PO TABS: ORAL_TABLET | ORAL | 5 refills | Status: AC

## 2023-07-07 NOTE — Progress Notes (Signed)
Established Patient Office Visit  Subjective   Patient ID: Kimberly Wade, female    DOB: 02/09/1971  Age: 52 y.o. MRN: [id number]  Chief Complaint  Patient presents with   Medical Management of Chronic Issues    HPI Patient presents for oxycodone and adderall refill. Adderall only works for her for 4 hrs. When it is working, she can stay focused. She does take it twice a day.  Her sleep, mood, and eating are fine. Oxycodone works well for her L shoulder and R bicep pain. Patient reports she does not take more than the directions say. Last opioid agreement in April 2024. Last UDS in feb 2023. She is not currently working.  Has never had colonoscopy. MGM hx CRC. Due for mammogram.  .. Active Ambulatory Problems    Diagnosis Date Noted   Insomnia 12/24/2012   Depression 12/24/2012   Migraine 12/24/2012   Hyperlipidemia 12/24/2012   Vitamin D deficiency 12/24/2012   Essential hypertension, benign 12/24/2012   Anxiety 12/24/2012   S/P gastric bypass 12/24/2012   Increased PTH level 12/24/2012   GERD (gastroesophageal reflux disease) 12/24/2012   Aspiration into lower respiratory tract 05/02/2013   Primary osteoarthritis of right hand 02/02/2014   Tension headache 08/08/2014   Ovarian cyst, right 09/14/2015   Metrorrhagia 09/18/2015   Anemia 10/26/2015   Right trigger finger 05/05/2016   Low back pain without sciatica 08/08/2016   Generalized anxiety disorder 08/09/2016   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 01/30/2017   Overweight (BMI 25.0-29.9) 02/01/2017   Acute shoulder bursitis, left 04/03/2017   Biceps muscle tear, left, subsequent encounter 06/08/2017   Complete tear of left rotator cuff 06/08/2017   S/P shoulder surgery 02/12/2018   Chronic left shoulder pain 02/12/2018   No energy 02/17/2018   Biceps rupture, proximal, right, initial encounter 06/03/2018   Raynaud's disease without gangrene 03/29/2019   Post  concussion syndrome 03/29/2019   Fall 12/31/2020   Inattention 12/31/2020   Poor concentration 12/31/2020   Chronic pain of both shoulders 05/10/2021   Chronic pain of both knees 05/10/2021   Polyarthritis 05/10/2021   Pleural effusion on left 08/28/2021   Left-sided chest pain 08/28/2021   SOB (shortness of breath) 08/28/2021   Pulmonary embolus (HCC) 08/28/2021   Hypocalcemia 09/03/2021   Hypomagnesemia 09/03/2021   Pneumonia of left lower lobe due to infectious organism 09/03/2021   Hypoalbuminemia 09/03/2021   Polypharmacy 12/17/2021   Confusion 12/17/2021   Hypokalemia 12/17/2021   Acute pain of left knee 12/20/2021   Iron deficiency anemia secondary to inadequate dietary iron intake 06/13/2022   Calculus of gallbladder without cholecystitis without obstruction 06/13/2022   DVT, lower extremity, recurrent, bilateral (HCC) 08/01/2022   Acute cough 09/02/2022   Anticoagulation goal of INR 2 to 3 11/12/2022   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   Panic attacks 11/12/2022   Vaginal dryness 11/18/2022   Thrombophlebitis of superficial vein of lower leg 12/08/2022   Chronic pain syndrome 12/08/2022   Localized swelling of left lower leg 04/01/2023   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   Cardiovascular:  Negative for  chest pain and palpitations.  Psychiatric/Behavioral:  Negative for depression. The patient is nervous/anxious. The patient does not have insomnia.       Objective:     BP 133/82   Pulse 80   Ht 5' (1.524 m)   Wt 154 lb (69.9 kg)   LMP 07/01/2022 (Approximate) Comment: has IUD  SpO2 99%   BMI 30.08 kg/m    Physical Exam Constitutional:      Appearance: Normal appearance.  HENT:     Head: Normocephalic.  Cardiovascular:     Rate and Rhythm: Normal rate and regular rhythm.     Pulses: Normal pulses.     Heart sounds: Normal heart sounds.  Pulmonary:     Effort: Pulmonary effort is normal.     Breath sounds: Normal breath sounds.  Neurological:     General: No focal deficit present.     Mental Status: She is alert and oriented to person, place, and time.  Psychiatric:        Mood and Affect: Mood normal.      The 10-year ASCVD risk score (Arnett DK, et al., 2019) is: 1.3%    Assessment & Plan:  Marland KitchenMarland KitchenMargaret "Chyrl Civatte" was seen today for medical management of chronic issues.  Diagnoses and all orders for this visit:  Chronic pain syndrome -     Drug Screen 12+Alcohol+CRT, Ur -     oxyCODONE (ROXICODONE) 15 MG immediate release tablet; Take 1 tablet (15 mg total) by mouth every 8 (eight) hours as needed for pain. -     oxyCODONE (ROXICODONE) 15 MG immediate release tablet; Take 1 tablet (15 mg total) by mouth every 8 (eight) hours as needed for pain. -     oxyCODONE (ROXICODONE) 15 MG immediate release tablet; Take 1 tablet (15 mg total) by mouth every 8 (eight) hours as needed for pain.  Medication management -     Drug Screen 12+Alcohol+CRT, Ur -     CMP14+EGFR  Chronic left shoulder pain  Attention deficit disorder (ADD) without hyperactivity -     amphetamine-dextroamphetamine (ADDERALL XR) 20 MG 24 hr capsule; Take 1 capsule (20 mg total) by mouth every morning. -     amphetamine-dextroamphetamine (ADDERALL XR) 20 MG 24 hr capsule; Take 1 capsule (20 mg total)  by mouth every morning. -     amphetamine-dextroamphetamine (ADDERALL XR) 20 MG 24 hr capsule; Take 1 capsule (20 mg total) by mouth every morning. -     amphetamine-dextroamphetamine (ADDERALL) 20 MG tablet; Take one tablet at 1pm. -     amphetamine-dextroamphetamine (ADDERALL) 20 MG tablet; Take one tablet at 1pm. -     amphetamine-dextroamphetamine (ADDERALL) 20 MG tablet; Take one tablet at 1pm.  Family history of colon cancer  Colon cancer screening -     Cologuard  Anxiety -     venlafaxine XR (EFFEXOR-XR) 75 MG 24 hr capsule; TAKE TWO CAPSULES BY MOUTH ONE TIME DAILY -     ARIPiprazole (ABILIFY) 15 MG tablet; Take 1 tablet (15 mg total) by mouth daily.  Mild episode of recurrent major depressive disorder (HCC) -     venlafaxine XR (EFFEXOR-XR) 75 MG  24 hr capsule; TAKE TWO CAPSULES BY MOUTH ONE TIME DAILY -     ARIPiprazole (ABILIFY) 15 MG tablet; Take 1 tablet (15 mg total) by mouth daily.  Primary insomnia -     mirtazapine (REMERON) 7.5 MG tablet; Take 1 tablet (7.5 mg total) by mouth at bedtime.  Essential hypertension, benign  DVT, lower extremity, recurrent, bilateral (HCC) -     apixaban (ELIQUIS) 5 MG TABS tablet; Take one tablet by mouth twice a day.   Adderall- changed from 20 mg fast acting BID to one 20 mg FA and one 20 mg ER per day.  Explained it is not recommended to increase daily dosage of Adderall to over 40 mg. Discussed the other medications that could decrease focus and make her feel groggy.   Pain contract UTD Will get UDS today .Marland KitchenPDMP reviewed during this encounter. No concerns Refilled for 3 months   Pt due for mammogram and colonoscopy. Recommended Cologuard due to patient apprehension for colonoscopy.  Advised patient to schedule mammogram. Discussed with patient importance of cancer screening.   Return in about 3 months (around 10/07/2023).    Tandy Gaw, PA-C

## 2023-07-07 NOTE — Progress Notes (Signed)
   Established Patient Office Visit  Subjective   Patient ID: Kimberly Wade, female    DOB: 1970-09-23  Age: 52 y.o. MRN: [id number]  Chief Complaint  Patient presents with   Medical Management of Chronic Issues    HPI    ROS    Objective:     BP (!) 141/79   Pulse 94   Ht 5' (1.524 m)   Wt 154 lb (69.9 kg)   LMP 07/01/2022 (Approximate) Comment: has IUD  SpO2 99%   BMI 30.08 kg/m    Physical Exam   No results found for any visits on 07/07/23.    The 10-year ASCVD risk score (Arnett DK, et al., 2019) is: 1.4%    Assessment & Plan:   Problem List Items Addressed This Visit       Unprioritized   Chronic left shoulder pain   Attention deficit disorder (ADD) without hyperactivity   Other Visit Diagnoses     Medication management    -  Primary   Family history of colon cancer       Colon cancer screening           No follow-ups on file.    Tandy Gaw, PA-C

## 2023-07-08 LAB — CMP14+EGFR
ALT: 18 [IU]/L (ref 0–32)
AST: 20 [IU]/L (ref 0–40)
Albumin: 3.4 g/dL — ABNORMAL LOW (ref 3.8–4.9)
Alkaline Phosphatase: 138 [IU]/L — ABNORMAL HIGH (ref 44–121)
BUN/Creatinine Ratio: 21 (ref 9–23)
BUN: 13 mg/dL (ref 6–24)
Bilirubin Total: 0.3 mg/dL (ref 0.0–1.2)
CO2: 21 mmol/L (ref 20–29)
Calcium: 7.8 mg/dL — ABNORMAL LOW (ref 8.7–10.2)
Chloride: 109 mmol/L — ABNORMAL HIGH (ref 96–106)
Creatinine, Ser: 0.63 mg/dL (ref 0.57–1.00)
Globulin, Total: 2.7 g/dL (ref 1.5–4.5)
Glucose: 86 mg/dL (ref 70–99)
Potassium: 4.2 mmol/L (ref 3.5–5.2)
Sodium: 141 mmol/L (ref 134–144)
Total Protein: 6.1 g/dL (ref 6.0–8.5)
eGFR: 107 mL/min/{1.73_m2} (ref >59)

## 2023-07-08 NOTE — Progress Notes (Signed)
Kimberly Wade,   Calcium is low are you taking any extra calcium? Make sure taking at least 1300mg  a day.  Albumin low. Increase protein in diet.

## 2023-07-11 LAB — DRUG SCREEN 12+ALCOHOL+CRT, UR
Amphetamines, Urine: POSITIVE — AB
BENZODIAZ UR QL: NEGATIVE ng/mL
Barbiturate: NEGATIVE ng/mL
Cannabinoids: NEGATIVE ng/mL
Cocaine (Metabolite): NEGATIVE ng/mL
Creatinine, Urine: 50.4 mg/dL (ref 20.0–300.0)
Ethanol, Urine: NEGATIVE %
Meperidine: NEGATIVE ng/mL
Methadone: NEGATIVE ng/mL
OPIATE SCREEN URINE: NEGATIVE ng/mL
Oxycodone/Oxymorphone, Urine: POSITIVE — AB
Phencyclidine: NEGATIVE ng/mL
Propoxyphene: NEGATIVE ng/mL
Tramadol: NEGATIVE ng/mL

## 2023-07-14 ENCOUNTER — Other Ambulatory Visit: Payer: Self-pay | Admitting: Physician Assistant

## 2023-07-14 DIAGNOSIS — F419 Anxiety disorder, unspecified: Secondary | ICD-10-CM

## 2023-07-20 ENCOUNTER — Encounter: Payer: Self-pay | Admitting: Physician Assistant

## 2023-07-20 DIAGNOSIS — G894 Chronic pain syndrome: Secondary | ICD-10-CM

## 2023-07-20 MED ORDER — OXYCODONE HCL 15 MG PO TABS: 15.0000 mg | ORAL_TABLET | Freq: Three times a day (TID) | ORAL | 0 refills | Status: DC | PRN

## 2023-07-20 NOTE — Telephone Encounter (Signed)
Ok to fill today

## 2023-07-21 ENCOUNTER — Telehealth: Payer: Self-pay

## 2023-07-21 NOTE — Telephone Encounter (Signed)
Done yesterday.

## 2023-07-21 NOTE — Telephone Encounter (Signed)
Copied from CRM [id number]. Topic: Clinical - Medication Question >> Jul 20, 2023 10:38 AM Dondra Prader A wrote: Reason for CRM: Pt called stating she is out of her oxyCODONE (ROXICODONE) 15 MG and prescription was sent to the pharmacy for tomorrow. Her 30 day is today and would need another script for today sent to the pharmacy so she can pick-up today.

## 2023-08-21 ENCOUNTER — Ambulatory Visit: Payer: Medicaid Other | Admitting: Physician Assistant

## 2023-09-05 ENCOUNTER — Other Ambulatory Visit: Payer: Self-pay | Admitting: Physician Assistant

## 2023-09-05 DIAGNOSIS — F988 Other specified behavioral and emotional disorders with onset usually occurring in childhood and adolescence: Secondary | ICD-10-CM

## 2023-09-07 ENCOUNTER — Encounter: Payer: Self-pay | Admitting: Obstetrics & Gynecology

## 2023-09-07 ENCOUNTER — Other Ambulatory Visit (HOSPITAL_COMMUNITY)
Admission: RE | Admit: 2023-09-07 | Discharge: 2023-09-07 | Disposition: A | Discharge status: Home / Self Care | Payer: Medicaid Other | Source: Ambulatory Visit | Attending: Obstetrics & Gynecology | Admitting: Obstetrics & Gynecology

## 2023-09-07 ENCOUNTER — Ambulatory Visit: Payer: Medicaid Other | Admitting: Obstetrics & Gynecology

## 2023-09-07 VITALS — BP 133/85 | HR 108 | Ht 65.0 in | Wt 149.0 lb

## 2023-09-07 DIAGNOSIS — N898 Other specified noninflammatory disorders of vagina: Secondary | ICD-10-CM | POA: Diagnosis not present

## 2023-09-07 NOTE — Progress Notes (Signed)
   Subjective:    Patient ID: Kimberly Wade, female    DOB: February 06, 1971, 52 y.o.   MRN: [id number]  HPI  53 yo female with LMP over a year ago.  Has mirena--and wants to be sure that she is in menopause before having it removed.  Patient is having hot flashes and night sweats.  This happened approximately a year ago when her menstrual cycle.  She is also having vaginal burning and pain with intercourse this past year as well.   Review of Systems  Constitutional: Negative.        Night sweats, hot flashes  Respiratory: Negative.    Cardiovascular: Negative.   Gastrointestinal: Negative.   Genitourinary:  Positive for vaginal pain.       Amenorrhea, vaginal burning       Objective:   Physical Exam Vitals reviewed.  Constitutional:      General: She is not in acute distress.    Appearance: She is well-developed.  HENT:     Head: Normocephalic and atraumatic.  Eyes:     Conjunctiva/sclera: Conjunctivae normal.  Cardiovascular:     Rate and Rhythm: Normal rate.  Pulmonary:     Effort: Pulmonary effort is normal.  Abdominal:     General: Abdomen is flat.     Palpations: Abdomen is soft.  Genitourinary:    Comments: Tanner V Vulva:  No lesion Vagina:  atrophic, pale, no lesion or blood Cervix:  No CMT Increased vaginal tone--relaxes with manual massage with rebound.  Skin:    General: Skin is warm and dry.  Neurological:     Mental Status: She is alert and oriented to person, place, and time.  Psychiatric:        Mood and Affect: Mood normal.    Vitals:   09/07/23 1400  BP: 133/85  Pulse: (!) 108  Weight: 149 lb (67.6 kg)  Height: 5\' 5"  (1.651 m)      Assessment & Plan:  53 yo female wit no menses >1 year and vaginal burning. Symptoms still consistent with menopause Pelvic floor dysfunction  Pelvic PT for dyspareunia List of vaginal moisturizers and Uberlube samples given FSH/Estradiol--then IUD removal is in menopausal range Hx of DVT and PE on  eliquis--will discuss non hormonal management of menopausal symptoms RTC in 2-3 weeks   27 minutes spent during this encounter to include review of records, history and physical, counseling, and documentation.

## 2023-09-08 ENCOUNTER — Telehealth: Payer: Self-pay

## 2023-09-08 ENCOUNTER — Other Ambulatory Visit: Payer: Self-pay | Admitting: Obstetrics & Gynecology

## 2023-09-08 ENCOUNTER — Encounter: Payer: Self-pay | Admitting: Obstetrics & Gynecology

## 2023-09-08 LAB — CERVICOVAGINAL ANCILLARY ONLY
Bacterial Vaginitis (gardnerella): POSITIVE — AB
Candida Glabrata: NEGATIVE
Candida Vaginitis: NEGATIVE
Chlamydia: NEGATIVE
Comment: NEGATIVE
Comment: NEGATIVE
Comment: NEGATIVE
Comment: NEGATIVE
Comment: NEGATIVE
Comment: NORMAL
Neisseria Gonorrhea: NEGATIVE
Trichomonas: NEGATIVE

## 2023-09-08 MED ORDER — METRONIDAZOLE 500 MG PO TABS: 500.0000 mg | ORAL_TABLET | Freq: Two times a day (BID) | ORAL | 0 refills | Status: DC

## 2023-09-08 NOTE — Telephone Encounter (Signed)
Task completed. Per patient, the pharmacy did not have the rx in stock on Saturday. Patient was informed by the pharmacy this afternoon that the medication is in stock and available for pick up.

## 2023-09-08 NOTE — Progress Notes (Signed)
Flagyl for BV prescribed.

## 2023-09-08 NOTE — Telephone Encounter (Signed)
Copied from CRM [id number]. Topic: Clinical - Prescription Issue >> Sep 07, 2023  3:34 PM Geroge Baseman wrote: Reason for CRM: Patient waiting on refill of adderall 20 mg 24 hour and she said the pharmacy does not see it. Although it looks like she did try to reorder the medicine as of the 09/05/2023 which was Saturday.

## 2023-09-09 NOTE — Telephone Encounter (Signed)
Refills sent

## 2023-09-28 ENCOUNTER — Ambulatory Visit: Payer: Medicaid Other | Admitting: Obstetrics & Gynecology

## 2023-10-07 ENCOUNTER — Ambulatory Visit: Payer: Medicaid Other | Admitting: Physician Assistant

## 2023-10-13 ENCOUNTER — Ambulatory Visit (INDEPENDENT_AMBULATORY_CARE_PROVIDER_SITE_OTHER): Payer: Medicaid Other | Admitting: Physician Assistant

## 2023-10-13 ENCOUNTER — Encounter: Payer: Self-pay | Admitting: Physician Assistant

## 2023-10-13 VITALS — BP 133/75 | HR 77

## 2023-10-13 DIAGNOSIS — Z1211 Encounter for screening for malignant neoplasm of colon: Secondary | ICD-10-CM

## 2023-10-13 DIAGNOSIS — F330 Major depressive disorder, recurrent, mild: Secondary | ICD-10-CM

## 2023-10-13 DIAGNOSIS — R2242 Localized swelling, mass and lump, left lower limb: Secondary | ICD-10-CM | POA: Diagnosis not present

## 2023-10-13 DIAGNOSIS — I10 Essential (primary) hypertension: Secondary | ICD-10-CM

## 2023-10-13 DIAGNOSIS — G894 Chronic pain syndrome: Secondary | ICD-10-CM

## 2023-10-13 DIAGNOSIS — F988 Other specified behavioral and emotional disorders with onset usually occurring in childhood and adolescence: Secondary | ICD-10-CM

## 2023-10-13 DIAGNOSIS — F419 Anxiety disorder, unspecified: Secondary | ICD-10-CM

## 2023-10-13 DIAGNOSIS — J0140 Acute pansinusitis, unspecified: Secondary | ICD-10-CM

## 2023-10-13 MED ORDER — AMPHETAMINE-DEXTROAMPHETAMINE 20 MG PO TABS: ORAL_TABLET | ORAL | 0 refills | Status: DC

## 2023-10-13 MED ORDER — ARIPIPRAZOLE 15 MG PO TABS: 15.0000 mg | ORAL_TABLET | Freq: Every day | ORAL | 1 refills | Status: DC

## 2023-10-13 MED ORDER — OXYCODONE HCL 15 MG PO TABS: 15.0000 mg | ORAL_TABLET | Freq: Three times a day (TID) | ORAL | 0 refills | Status: DC | PRN

## 2023-10-13 MED ORDER — FLUTICASONE PROPIONATE 50 MCG/ACT NA SUSP: 2.0000 | Freq: Every day | NASAL | 0 refills | Status: DC

## 2023-10-13 MED ORDER — AMPHETAMINE-DEXTROAMPHET ER 20 MG PO CP24: 20.0000 mg | ORAL_CAPSULE | ORAL | 0 refills | Status: DC

## 2023-10-13 MED ORDER — AZITHROMYCIN 250 MG PO TABS: ORAL_TABLET | ORAL | 0 refills | Status: DC

## 2023-10-13 MED ORDER — AMPHETAMINE-DEXTROAMPHET ER 20 MG PO CP24
20.0000 mg | ORAL_CAPSULE | ORAL | 0 refills | Status: DC
Start: 2023-10-13 — End: 2024-01-12

## 2023-10-13 MED ORDER — HYDROCHLOROTHIAZIDE 25 MG PO TABS: 25.0000 mg | ORAL_TABLET | Freq: Every day | ORAL | 3 refills | Status: AC

## 2023-10-13 NOTE — Progress Notes (Signed)
 Established Patient Office Visit  Subjective   Patient ID: Kimberly Wade, female    DOB: [birth year]  Age: 53 y.o. MRN: [id number]  CC: chronic pain syndrome follow up  HPI Patient is a 53 yo female who presents to the clinic for chronic pain management. She states that having the flu and recent move she states this past month she has a lot of issues with pain. She is completely out and needs refills a few days early this month.   All other medications are doing well. Mood is good. She is really happy about moving into a permanent home!   She also states she has just gotten over flu A and thinks she has a sinus infection. She endorses congestion, dry cough, mucus that is yellow in color, sinus pain and pressure worsened with bending over. She has used from Afrin without relief.  .. Active Ambulatory Problems    Diagnosis Date Noted   Insomnia 12/24/2012   Depression 12/24/2012   Migraine 12/24/2012   Hyperlipidemia 12/24/2012   Vitamin D deficiency 12/24/2012   Essential hypertension, benign 12/24/2012   Anxiety 12/24/2012   S/P gastric bypass 12/24/2012   Increased PTH level 12/24/2012   GERD (gastroesophageal reflux disease) 12/24/2012   Aspiration into lower respiratory tract 05/02/2013   Primary osteoarthritis of right hand 02/02/2014   Tension headache 08/08/2014   Ovarian cyst, right 09/14/2015   Anemia 10/26/2015   Right trigger finger 05/05/2016   Low back pain without sciatica 08/08/2016   Generalized anxiety disorder 08/09/2016   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 01/30/2017   Overweight (BMI 25.0-29.9) 02/01/2017   Acute shoulder bursitis, left 04/03/2017   Biceps muscle tear, left, subsequent encounter 06/08/2017   Complete tear of left rotator cuff 06/08/2017   S/P shoulder surgery 02/12/2018   Chronic left shoulder pain 02/12/2018   No energy 02/17/2018   Biceps rupture, proximal, right, initial encounter  06/03/2018   Raynaud's disease without gangrene 03/29/2019   Post concussion syndrome 03/29/2019   Fall 12/31/2020   Inattention 12/31/2020   Poor concentration 12/31/2020   Chronic pain of both shoulders 05/10/2021   Chronic pain of both knees 05/10/2021   Polyarthritis 05/10/2021   Pleural effusion on left 08/28/2021   Left-sided chest pain 08/28/2021   SOB (shortness of breath) 08/28/2021   Pulmonary embolus (HCC) 08/28/2021   Hypocalcemia 09/03/2021   Hypomagnesemia 09/03/2021   Pneumonia of left lower lobe due to infectious organism 09/03/2021   Hypoalbuminemia 09/03/2021   Polypharmacy 12/17/2021   Confusion 12/17/2021   Hypokalemia 12/17/2021   Acute pain of left knee 12/20/2021   Iron deficiency anemia secondary to inadequate dietary iron intake 06/13/2022   Calculus of gallbladder without cholecystitis without obstruction 06/13/2022   DVT, lower extremity, recurrent, bilateral (HCC) 08/01/2022   Acute cough 09/02/2022   Anticoagulation goal of INR 2 to 3 11/12/2022   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   Panic attacks 11/12/2022   Vaginal dryness 11/18/2022   Thrombophlebitis of superficial vein of lower leg 12/08/2022   Chronic pain syndrome 12/08/2022   Localized swelling of left lower leg 04/01/2023   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) 04/22/2023   Left leg pain 04/22/2023   Resolved Ambulatory Problems    Diagnosis Date Noted   Lingular pneumonia 12/24/2012   Type 2 diabetes mellitus (HCC) 12/24/2012   Sinusitis, chronic 12/24/2012   Diabetic retinopathy (HCC) 12/24/2012   Metrorrhagia  09/18/2015   Right hand pain 05/05/2016   Acute cystitis 06/18/2016   Influenza-like illness 08/29/2016   Acute non-recurrent maxillary sinusitis 10/06/2017   Past Medical History:  Diagnosis Date   Arthritis    Chronic pain    Hypertension    Pneumococcal pneumonia (HCC) 2000   Pneumonia     Review of Systems   Constitutional:  Negative for chills and fever.  HENT:  Positive for congestion and sinus pain.   Eyes: Negative.   Respiratory:  Positive for cough and shortness of breath. Negative for wheezing.   Cardiovascular:  Negative for chest pain.  Gastrointestinal: Negative.   Skin: Negative.   Neurological:  Positive for headaches.     Objective:    BP 133/75 (BP Location: Right Arm, Patient Position: Sitting)   Pulse 77   SpO2 100%  BP Readings from Last 3 Encounters:  10/13/23 133/75  09/07/23 133/85  07/07/23 133/82   Wt Readings from Last 3 Encounters:  09/07/23 149 lb (67.6 kg)  07/07/23 154 lb (69.9 kg)  07/02/23 150 lb (68 kg)   ..    10/13/2023   10:29 AM 07/07/2023    9:53 AM 04/01/2023    7:51 AM 11/12/2022   11:08 AM 09/02/2022    9:03 AM  Depression screen PHQ 2/9  Decreased Interest 1 1 0 2 3  Down, Depressed, Hopeless 1 1 0 2 2  PHQ - 2 Score 2 2 0 4 5  Altered sleeping 1 2  1 3   Tired, decreased energy 2 2  3 3   Change in appetite 2 1  3 1   Feeling bad or failure about yourself  1 1  2 1   Trouble concentrating 2 2  1 3   Moving slowly or fidgety/restless 0 2  1 1   Suicidal thoughts 0 0  1 1  PHQ-9 Score 10 12  16 18   Difficult doing work/chores  Very difficult  Extremely dIfficult Extremely dIfficult   ..    10/13/2023   10:29 AM 07/07/2023    9:53 AM 11/12/2022   11:08 AM 09/02/2022    9:03 AM  GAD 7 : Generalized Anxiety Score  Nervous, Anxious, on Edge 1 2 2 2   Control/stop worrying 1 2 2 3   Worry too much - different things 1 2 2 3   Trouble relaxing 1 2 2 1   Restless 0 2 1 1   Easily annoyed or irritable 1 3 3 3   Afraid - awful might happen 0 1 0 1  Total GAD 7 Score 5 14 12 14   Anxiety Difficulty Very difficult Extremely difficult Extremely difficult Extremely difficult     Physical Exam Constitutional:      Appearance: Normal appearance.  HENT:     Head: Normocephalic and atraumatic.     Right Ear: Tympanic membrane normal.     Left  Ear: Tympanic membrane normal.     Nose: Congestion present.     Mouth/Throat:     Mouth: Mucous membranes are moist.     Pharynx: Oropharynx is clear.  Eyes:     Extraocular Movements: Extraocular movements intact.  Cardiovascular:     Rate and Rhythm: Normal rate and regular rhythm.     Pulses: Normal pulses.     Heart sounds: Normal heart sounds.  Pulmonary:     Effort: Pulmonary effort is normal.     Breath sounds: Normal breath sounds.  Musculoskeletal:        General: Normal range of motion.  Cervical back: Normal range of motion.  Skin:    General: Skin is warm.  Neurological:     Mental Status: She is alert.  Psychiatric:        Mood and Affect: Mood normal.    Last CBC Lab Results  Component Value Date   WBC 7.0 09/02/2022   HGB 9.6 (L) 09/02/2022   HCT 29.7 (L) 09/02/2022   MCV 83.4 09/02/2022   MCH 27.0 09/02/2022   RDW 14.5 09/02/2022   PLT 271 09/02/2022   Last metabolic panel Lab Results  Component Value Date   GLUCOSE 86 07/07/2023   NA 141 07/07/2023   K 4.2 07/07/2023   CL 109 (H) 07/07/2023   CO2 21 07/07/2023   BUN 13 07/07/2023   CREATININE 0.63 07/07/2023   EGFR 107 07/07/2023   CALCIUM 7.8 (L) 07/07/2023   PROT 6.1 07/07/2023   ALBUMIN 3.4 (L) 07/07/2023   LABGLOB 2.7 07/07/2023   BILITOT 0.3 07/07/2023   ALKPHOS 138 (H) 07/07/2023   AST 20 07/07/2023   ALT 18 07/07/2023   ANIONGAP 5 05/19/2017   Last lipids Lab Results  Component Value Date   CHOL 132 09/02/2022   HDL 57 09/02/2022   LDLCALC 61 09/02/2022   TRIG 63 09/02/2022   CHOLHDL 2.3 09/02/2022   Last hemoglobin A1c Lab Results  Component Value Date   HGBA1C 5.2 03/25/2019   Last thyroid functions Lab Results  Component Value Date   TSH 2.06 02/12/2018   The 10-year ASCVD risk score (Arnett DK, et al., 2019) is: 1.3%    Assessment & Plan:  Marland KitchenMarland KitchenMargaret "Chyrl Civatte" was seen today for medical management of chronic issues.  Diagnoses and all orders for this  visit:  Chronic pain syndrome -     oxyCODONE (ROXICODONE) 15 MG immediate release tablet; Take 1 tablet (15 mg total) by mouth every 8 (eight) hours as needed for pain. -     oxyCODONE (ROXICODONE) 15 MG immediate release tablet; Take 1 tablet (15 mg total) by mouth every 8 (eight) hours as needed for pain. -     oxyCODONE (ROXICODONE) 15 MG immediate release tablet; Take 1 tablet (15 mg total) by mouth every 8 (eight) hours as needed for pain.  Attention deficit disorder (ADD) without hyperactivity -     amphetamine-dextroamphetamine (ADDERALL XR) 20 MG 24 hr capsule; Take 1 capsule (20 mg total) by mouth every morning. -     amphetamine-dextroamphetamine (ADDERALL XR) 20 MG 24 hr capsule; Take 1 capsule (20 mg total) by mouth every morning. -     amphetamine-dextroamphetamine (ADDERALL) 20 MG tablet; Take one tablet at 1pm. -     amphetamine-dextroamphetamine (ADDERALL) 20 MG tablet; Take one tablet at 1pm. -     amphetamine-dextroamphetamine (ADDERALL) 20 MG tablet; Take one tablet at 1pm. -     amphetamine-dextroamphetamine (ADDERALL XR) 20 MG 24 hr capsule; Take 1 capsule (20 mg total) by mouth every morning.  Essential hypertension, benign -     hydrochlorothiazide (HYDRODIURIL) 25 MG tablet; Take 1 tablet (25 mg total) by mouth daily.  Localized swelling of left lower leg -     hydrochlorothiazide (HYDRODIURIL) 25 MG tablet; Take 1 tablet (25 mg total) by mouth daily.  Anxiety -     ARIPiprazole (ABILIFY) 15 MG tablet; Take 1 tablet (15 mg total) by mouth daily.  Mild episode of recurrent major depressive disorder (HCC) -     ARIPiprazole (ABILIFY) 15 MG tablet; Take 1 tablet (15 mg total)  by mouth daily.  Colon cancer screening -     Cancel: Cologuard  Acute non-recurrent pansinusitis -     azithromycin (ZITHROMAX Z-PAK) 250 MG tablet; Take 2 tablets (500 mg) on  Day 1,  followed by 1 tablet (250 mg) once daily on Days 2 through 5.  Other orders -     fluticasone (FLONASE)  50 MCG/ACT nasal spray; Place 2 sprays into both nostrils daily.    - Due for mammogram and colonoscopy  - Patient requests cologuard to be sent   - Referral placed for mammogram - Due for Shingrix vaccine 2 of 2, will wait until feeling better - Start Azithromycin for sinus infection - Start Flonase spray for allergies  - Continue compression stocking and elevating feet for restless legs  - BP goal <130/59mmg  - BP to goal today  - Continue Amlodipine 2.5mg  daily  - Continue hydrochlorothiazide 25mg  daily  - ADHD  - Continue Adderall XR 20mg  daily and 20mg  IR in afternoon  -anxiety/depression - Continue Hydroxyzine 25mg  daily  - Continue Buspar 7.5mg  TID  - Continue abilify 15mg  every day - Continue effexor XR 150mg  every day - continue remeron 15mg  at bedtime - Pain   - Roxicodone 15mg  IR by mouth PRN for pain - pain contract UTD - .Marland KitchenPDMP reviewed during this encounter.  - F/u in 3 months to renew pain contract and for medication refills  Tandy Gaw, PA-C

## 2023-10-13 NOTE — Patient Instructions (Signed)

## 2023-10-14 ENCOUNTER — Encounter: Payer: Self-pay | Admitting: Physician Assistant

## 2023-10-27 NOTE — Progress Notes (Incomplete)
 Established Patient Office Visit  Subjective   Patient ID: Kimberly Wade, female    DOB: February 03, 1971  Age: 53 y.o. MRN: [id number]  CC: anemia   HPI Patient is a 53 yo female who presents wanting blood work. She thinks that her iron is "off again."   .[name] Active Ambulatory Problems    Diagnosis Date Noted   Insomnia 12/24/2012   Depression 12/24/2012   Migraine 12/24/2012   Hyperlipidemia 12/24/2012   Vitamin D deficiency 12/24/2012   Essential hypertension, benign 12/24/2012   Anxiety 12/24/2012   S/P gastric bypass 12/24/2012   Increased PTH level 12/24/2012   GERD (gastroesophageal reflux disease) 12/24/2012   Aspiration into lower respiratory tract 05/02/2013   Primary osteoarthritis of right hand 02/02/2014   Tension headache 08/08/2014   Ovarian cyst, right 09/14/2015   Anemia 10/26/2015   Right trigger finger 05/05/2016   Low back pain without sciatica 08/08/2016   Generalized anxiety disorder 08/09/2016   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 01/30/2017   Overweight (BMI 25.0-29.9) 02/01/2017   Acute shoulder bursitis, left 04/03/2017   Biceps muscle tear, left, subsequent encounter 06/08/2017   Complete tear of left rotator cuff 06/08/2017   S/P shoulder surgery 02/12/2018   Chronic left shoulder pain 02/12/2018   No energy 02/17/2018   Biceps rupture, proximal, right, initial encounter 06/03/2018   Raynaud's disease without gangrene 03/29/2019   Post concussion syndrome 03/29/2019   Fall 12/31/2020   Inattention 12/31/2020   Poor concentration 12/31/2020   Chronic pain of both shoulders 05/10/2021   Chronic pain of both knees 05/10/2021   Polyarthritis 05/10/2021   Pleural effusion on left 08/28/2021   Left-sided chest pain 08/28/2021   SOB (shortness of breath) 08/28/2021   Pulmonary embolus (HCC) 08/28/2021   Hypocalcemia 09/03/2021   Hypomagnesemia 09/03/2021   Pneumonia of left lower lobe due to  infectious organism 09/03/2021   Hypoalbuminemia 09/03/2021   Polypharmacy 12/17/2021   Confusion 12/17/2021   Hypokalemia 12/17/2021   Acute pain of left knee 12/20/2021   Iron deficiency anemia secondary to inadequate dietary iron intake 06/13/2022   Calculus of gallbladder without cholecystitis without obstruction 06/13/2022   DVT, lower extremity, recurrent, bilateral (HCC) 08/01/2022   Acute cough 09/02/2022   Anticoagulation goal of INR 2 to 3 11/12/2022   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   Panic attacks 11/12/2022   Vaginal dryness 11/18/2022   Thrombophlebitis of superficial vein of lower leg 12/08/2022   Chronic pain syndrome 12/08/2022   Localized swelling of left lower leg 04/01/2023   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) 04/22/2023   Left leg pain 04/22/2023   Resolved Ambulatory Problems    Diagnosis Date Noted   Lingular pneumonia 12/24/2012   Type 2 diabetes mellitus (HCC) 12/24/2012   Sinusitis, chronic 12/24/2012   Diabetic retinopathy (HCC) 12/24/2012   Metrorrhagia 09/18/2015   Right hand pain 05/05/2016   Acute cystitis 06/18/2016   Influenza-like illness 08/29/2016   Acute non-recurrent maxillary sinusitis 10/06/2017   Past Medical History:  Diagnosis Date   Arthritis    Chronic pain    Hypertension    Pneumococcal pneumonia (HCC) 2000   Pneumonia      ROS See HPI   Objective:    There were no vitals taken for this visit. {Vitals History (Optional):23777}  Physical Exam Constitutional:      Appearance: Normal appearance.  HENT:     Head: Normocephalic  and atraumatic.  Cardiovascular:     Rate and Rhythm: Normal rate and regular rhythm.     Pulses: Normal pulses.     Heart sounds: Normal heart sounds.  Pulmonary:     Effort: Pulmonary effort is normal.     Breath sounds: Normal breath sounds.  Skin:    General: Skin is warm.     Capillary Refill: Capillary refill takes less  than 2 seconds.  Neurological:     Mental Status: She is alert.    Last CBC Lab Results  Component Value Date   WBC 7.0 09/02/2022   HGB 9.6 (L) 09/02/2022   HCT 29.7 (L) 09/02/2022   MCV 83.4 09/02/2022   MCH 27.0 09/02/2022   RDW 14.5 09/02/2022   PLT 271 09/02/2022       Assessment & Plan:      Ilean China, Student-PA

## 2023-10-28 ENCOUNTER — Ambulatory Visit: Admitting: Physician Assistant

## 2023-10-29 ENCOUNTER — Telehealth: Payer: Self-pay | Admitting: Physician Assistant

## 2023-10-29 NOTE — Telephone Encounter (Signed)
 Pt wants know if lab orders can be put in for blood panel. Please advise.

## 2023-10-30 ENCOUNTER — Encounter: Payer: Self-pay | Admitting: Physician Assistant

## 2023-11-11 ENCOUNTER — Other Ambulatory Visit: Payer: Self-pay

## 2023-11-11 DIAGNOSIS — E559 Vitamin D deficiency, unspecified: Secondary | ICD-10-CM

## 2023-11-11 DIAGNOSIS — D649 Anemia, unspecified: Secondary | ICD-10-CM

## 2023-11-11 DIAGNOSIS — I10 Essential (primary) hypertension: Secondary | ICD-10-CM

## 2023-11-16 ENCOUNTER — Telehealth: Payer: Self-pay

## 2023-11-16 ENCOUNTER — Encounter: Payer: Self-pay | Admitting: Physician Assistant

## 2023-11-16 ENCOUNTER — Ambulatory Visit: Payer: Self-pay | Admitting: Physician Assistant

## 2023-11-16 ENCOUNTER — Telehealth (INDEPENDENT_AMBULATORY_CARE_PROVIDER_SITE_OTHER): Admitting: Physician Assistant

## 2023-11-16 VITALS — BP 127/76 | HR 87

## 2023-11-16 DIAGNOSIS — J42 Unspecified chronic bronchitis: Secondary | ICD-10-CM | POA: Diagnosis not present

## 2023-11-16 DIAGNOSIS — J329 Chronic sinusitis, unspecified: Secondary | ICD-10-CM

## 2023-11-16 DIAGNOSIS — Z20822 Contact with and (suspected) exposure to covid-19: Secondary | ICD-10-CM

## 2023-11-16 DIAGNOSIS — R059 Cough, unspecified: Secondary | ICD-10-CM

## 2023-11-16 DIAGNOSIS — J069 Acute upper respiratory infection, unspecified: Secondary | ICD-10-CM

## 2023-11-16 MED ORDER — AIRSUPRA 90-80 MCG/ACT IN AERO: 2.0000 | INHALATION_SPRAY | Freq: Four times a day (QID) | RESPIRATORY_TRACT | 3 refills | Status: DC | PRN

## 2023-11-16 MED ORDER — ALBUTEROL SULFATE HFA 108 (90 BASE) MCG/ACT IN AERS: INHALATION_SPRAY | RESPIRATORY_TRACT | 0 refills | Status: DC

## 2023-11-16 MED ORDER — PROMETHAZINE-DM 6.25-15 MG/5ML PO SYRP: 5.0000 mL | ORAL_SOLUTION | Freq: Four times a day (QID) | ORAL | 0 refills | Status: DC | PRN

## 2023-11-16 MED ORDER — DOXYCYCLINE HYCLATE 100 MG PO TABS: 100.0000 mg | ORAL_TABLET | Freq: Two times a day (BID) | ORAL | 0 refills | Status: DC

## 2023-11-16 MED ORDER — PREDNISONE 20 MG PO TABS: ORAL_TABLET | ORAL | 0 refills | Status: DC

## 2023-11-16 NOTE — Progress Notes (Unsigned)
..  Virtual Visit via Video Note  I connected with Kimberly Wade on 11/18/23 at 10:50 AM EDT by a video enabled telemedicine application and verified that I am speaking with the correct person using two identifiers.  Location: Patient: home Provider: clinic  .Marland KitchenParticipating in visit:  Patient: Kimberly Wade Provider: Tandy Gaw PA-C   I discussed the limitations of evaluation and management by telemedicine and the availability of in person appointments. The patient expressed understanding and agreed to proceed.  History of Present Illness: Pt is a 53 yo female who presents to the clinic with URI symptoms for the last week that are not improving. She feels like her shortness of breath and chest tightness is worsening. She has some breathing issues when even trying to lay flat. She has taken tylenol cold sinus severe. She denies any fever, chills, body aches. Her cough cough and drainage is productive. She is using her albuterol inhaler with relief multiple times a day.      Observations/Objective: No acute distress Productive cough and congested sounding No labored breathing  Today's Vitals   11/16/23 1056  BP: 127/76  Pulse: 87  TempSrc: Oral   There is no height or weight on file to calculate BMI.  Assessment and Plan: Marland KitchenMarland KitchenMargaret "Chyrl Civatte" was seen today for medical management of chronic issues.  Diagnoses and all orders for this visit:  Sinobronchitis -     doxycycline (VIBRA-TABS) 100 MG tablet; Take 1 tablet (100 mg total) by mouth 2 (two) times daily. -     Albuterol-Budesonide (AIRSUPRA) 90-80 MCG/ACT AERO; Inhale 2 puffs into the lungs every 6 (six) hours as needed. -     predniSONE (DELTASONE) 20 MG tablet; Take 2 tablets every morning for 5 days. -     promethazine-dextromethorphan (PROMETHAZINE-DM) 6.25-15 MG/5ML syrup; Take 5 mLs by mouth 4 (four) times daily as needed. -     albuterol (VENTOLIN HFA) 108 (90 Base) MCG/ACT inhaler; INHALE TWO PUFFS BY MOUTH EVERY 6  HOURS AS NEEDED   Start doxycycline and prednisone  Use duoneb and/or albuterol as needed every 4-6 hours for SOB/cough Cough syrup given Rest and hydrate Follow up as needed if symptoms persist or worsen   Follow Up Instructions:    I discussed the assessment and treatment plan with the patient. The patient was provided an opportunity to ask questions and all were answered. The patient agreed with the plan and demonstrated an understanding of the instructions.   The patient was advised to call back or seek an in-person evaluation if the symptoms worsen or if the condition fails to improve as anticipated.   Tandy Gaw, PA-C

## 2023-11-16 NOTE — Telephone Encounter (Signed)
 Copied from CRM [id number]. Topic: Clinical - Prescription Issue >> Nov 16, 2023  2:13 PM Fuller Mandril wrote: Reason for CRM: Patient called. She is at pharmacy and unable to get the inhaler that was prescribed at appt today. She sates it requires Prior authorization and also pharmacy would have to order it once approved which might take time. She wants to know if there is anything else that can be prescribed while PA is submitted and this one is ordered. Thank You

## 2023-11-16 NOTE — Telephone Encounter (Signed)
 Chief Complaint: Difficulty breathing x1 week  Symptoms: Chest heaviness, intermittent SOB, "a lot of mucus" off white color to bright green, lightheadedness, dizziness, productive cough, difficulty sleeping Pertinent Negatives: Patient denies fever  Disposition: [x] Appointment(virtual)  Additional Notes: Pt unable to leave home as she has a 53 year old child and she does not have a ride. This RN made pt a virtual visit with PCP this morning since pt cannot leave home. This RN educated pt on new-worsening symptoms and when to call back/seek emergent care. Pt verbalized understanding and agrees to plan.    Copied from CRM [id number]. Topic: Clinical - Red Word Triage >> Nov 16, 2023 10:20 AM Antony Haste wrote: Red Word that prompted transfer to Nurse Triage: Trouble breathing and chest tightness, the patient has an increased cough and  phelgm coloration changes from yellow to bright green. Reason for Disposition  [1] MILD difficulty breathing (e.g., minimal/no SOB at rest, SOB with walking, pulse <100) AND [2] NEW-onset or WORSE than normal  Answer Assessment - Initial Assessment Questions Chief Complaint: Difficulty breathing x1 week  Symptoms: Chest heaviness, intermittent SOB, "a lot of mucus" off white color to bright green, lightheadedness, dizziness, productive cough, difficulty sleeping Pertinent Negatives: Patient denies fever  Protocols used: Breathing Difficulty-A-AH

## 2023-11-17 ENCOUNTER — Telehealth: Payer: Self-pay

## 2023-11-17 ENCOUNTER — Other Ambulatory Visit (HOSPITAL_COMMUNITY): Payer: Self-pay

## 2023-11-17 NOTE — Telephone Encounter (Signed)
 Pharmacy Patient Advocate Encounter   Received notification from CoverMyMeds that prior authorization for Airsupra 90-80MCG/ACT aerosol is required/requested.   Insurance verification completed.   The patient is insured through [facility] [employer] [place] .   Per test claim: PA required; PA submitted to above mentioned insurance via CoverMyMeds Key/confirmation #/EOC [id number] Status is pending

## 2023-11-17 NOTE — Telephone Encounter (Signed)
 Called and left detailed voice mail message on home # ( allowed on DPR )

## 2023-11-17 NOTE — Telephone Encounter (Signed)
 Pharmacy Patient Advocate Encounter  Received notification from [facility] that Prior Authorization for Airsupra 90-80MCG/ACT aerosol is required/requested.  has been DENIED.  Full denial letter will be uploaded to the media tab. See denial reason below.   PA #/Case ID/Reference #:  [id number]

## 2023-11-18 ENCOUNTER — Encounter: Payer: Self-pay | Admitting: Physician Assistant

## 2023-12-30 ENCOUNTER — Encounter: Payer: Self-pay | Admitting: Physician Assistant

## 2023-12-30 MED ORDER — MAGIC MOUTHWASH W/LIDOCAINE: 15.0000 mL | Freq: Four times a day (QID) | ORAL | 11 refills | Status: AC | PRN

## 2023-12-30 NOTE — Telephone Encounter (Signed)
 Called pharmacy and they have not received the prescription . It does show print on script was this faxed to pharmacy instead?

## 2023-12-30 NOTE — Telephone Encounter (Signed)
 Not sure will have to check keys boxes. I signed it.

## 2023-12-31 NOTE — Telephone Encounter (Signed)
 Faxed prescription to [facility] today @ 8:12am

## 2024-01-06 ENCOUNTER — Encounter: Payer: Self-pay | Admitting: Physician Assistant

## 2024-01-06 ENCOUNTER — Ambulatory Visit (INDEPENDENT_AMBULATORY_CARE_PROVIDER_SITE_OTHER): Admitting: Physician Assistant

## 2024-01-06 VITALS — BP 104/58 | HR 84 | Ht 65.0 in | Wt 141.4 lb

## 2024-01-06 DIAGNOSIS — D649 Anemia, unspecified: Secondary | ICD-10-CM

## 2024-01-06 DIAGNOSIS — R600 Localized edema: Secondary | ICD-10-CM | POA: Insufficient documentation

## 2024-01-06 DIAGNOSIS — L814 Other melanin hyperpigmentation: Secondary | ICD-10-CM

## 2024-01-06 DIAGNOSIS — Z1211 Encounter for screening for malignant neoplasm of colon: Secondary | ICD-10-CM

## 2024-01-06 DIAGNOSIS — R5383 Other fatigue: Secondary | ICD-10-CM

## 2024-01-06 DIAGNOSIS — Z23 Encounter for immunization: Secondary | ICD-10-CM | POA: Diagnosis not present

## 2024-01-06 DIAGNOSIS — Z1231 Encounter for screening mammogram for malignant neoplasm of breast: Secondary | ICD-10-CM

## 2024-01-06 DIAGNOSIS — E559 Vitamin D deficiency, unspecified: Secondary | ICD-10-CM | POA: Diagnosis not present

## 2024-01-06 LAB — POCT UA - MICROALBUMIN
Albumin/Creatinine Ratio, Urine, POC: <30
Creatinine, POC: 50 mg/dL
Microalbumin Ur, POC: 10 mg/L

## 2024-01-06 MED ORDER — FUROSEMIDE 20 MG PO TABS: ORAL_TABLET | ORAL | 1 refills | Status: AC

## 2024-01-06 NOTE — Progress Notes (Unsigned)
 Pt presents with the following concerns: Bilateral foot swelling x 1 week L leg swelling. Skin feels as though sunburnt Spot on L forearm Feels cold during warm temperatures Feels fatigued or sleep x 2 weeks. Pt states she falls asleep when sitting. No apnea Dx. No snoring.

## 2024-01-06 NOTE — Progress Notes (Unsigned)
 Established Patient Office Visit  Subjective   Patient ID: Kimberly Wade, female    DOB: [birth year]  Age: 53 y.o. MRN: [id number]  Chief Complaint  Patient presents with   Nevus   Fatigue    HPI Pt is a 53 yo female who presents to the clinic with worsening swelling of both legs for the last 2 weeks but the left leg is a little worse than right. She takes HcTZ daily. She states "it feels like my legs are sunburnt". She denies any rashes. Her medications have not changed. She denies any cough or trouble breathing. No urinary symptoms. Hx of DVTs but on anticoagulation. She has been standing more than usual. She is so tired for the last 2 weeks. She feels like she could "sleep standing up". Hx of IDA and on oral iron.     Pt is concerned about a darker macular area on right arm. She has had a lot of sun exposure of the years. It does not hurt, itch, or bleed. Seems to be getting a little bigger.    Active Ambulatory Problems    Diagnosis Date Noted   Insomnia 12/24/2012   Depression 12/24/2012   Migraine 12/24/2012   Hyperlipidemia 12/24/2012   Vitamin D  deficiency 12/24/2012   Essential hypertension, benign 12/24/2012   Anxiety 12/24/2012   S/P gastric bypass 12/24/2012   Increased PTH level 12/24/2012   GERD (gastroesophageal reflux disease) 12/24/2012   Aspiration into lower respiratory tract 05/02/2013   Primary osteoarthritis of right hand 02/02/2014   Tension headache 08/08/2014   Ovarian cyst, right 09/14/2015   Anemia 10/26/2015   Right trigger finger 05/05/2016   Low back pain without sciatica 08/08/2016   Generalized anxiety disorder 08/09/2016   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 01/30/2017   Overweight (BMI 25.0-29.9) 02/01/2017   Acute shoulder bursitis, left 04/03/2017   Biceps muscle tear, left, subsequent encounter 06/08/2017   Complete tear of left rotator cuff 06/08/2017   S/P shoulder surgery  02/12/2018   Chronic left shoulder pain 02/12/2018   No energy 02/17/2018   Biceps rupture, proximal, right, initial encounter 06/03/2018   Raynaud's disease without gangrene 03/29/2019   Post concussion syndrome 03/29/2019   Fall 12/31/2020   Inattention 12/31/2020   Poor concentration 12/31/2020   Chronic pain of both shoulders 05/10/2021   Chronic pain of both knees 05/10/2021   Polyarthritis 05/10/2021   Pleural effusion on left 08/28/2021   Left-sided chest pain 08/28/2021   SOB (shortness of breath) 08/28/2021   Pulmonary embolus (HCC) 08/28/2021   Hypocalcemia 09/03/2021   Hypomagnesemia 09/03/2021   Pneumonia of left lower lobe due to infectious organism 09/03/2021   Hypoalbuminemia 09/03/2021   Polypharmacy 12/17/2021   Confusion 12/17/2021   Hypokalemia 12/17/2021   Acute pain of left knee 12/20/2021   Iron deficiency anemia secondary to inadequate dietary iron intake 06/13/2022   Calculus of gallbladder without cholecystitis without obstruction 06/13/2022   DVT, lower extremity, recurrent, bilateral (HCC) 08/01/2022   Acute cough 09/02/2022   Anticoagulation goal of INR 2 to 3 11/12/2022   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   Panic attacks 11/12/2022   Vaginal dryness 11/18/2022   Thrombophlebitis of superficial vein of lower leg 12/08/2022   Chronic pain syndrome 12/08/2022   Localized swelling of left lower leg 04/01/2023   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) 04/22/2023   Left leg pain 04/22/2023   Bilateral lower  extremity edema 01/06/2024   Solar lentigo 01/12/2024   Resolved Ambulatory Problems    Diagnosis Date Noted   Lingular pneumonia 12/24/2012   Type 2 diabetes mellitus (HCC) 12/24/2012   Sinusitis, chronic 12/24/2012   Diabetic retinopathy (HCC) 12/24/2012   Metrorrhagia 09/18/2015   Right hand pain 05/05/2016   Acute cystitis 06/18/2016   Influenza-like illness 08/29/2016   Acute  non-recurrent maxillary sinusitis 10/06/2017   Past Medical History:  Diagnosis Date   Arthritis    Chronic pain    Hypertension    Pneumococcal pneumonia (HCC) 2000   Pneumonia     ROS See HPI.    Objective:     BP (!) 104/58 (BP Location: Right Arm, Patient Position: Sitting, Cuff Size: Normal)   Pulse 84   Ht 5\' 5"  (1.651 m)   Wt 141 lb 6.4 oz (64.1 kg)   SpO2 100%   BMI 23.53 kg/m  BP Readings from Last 3 Encounters:  01/06/24 (!) 104/58  11/16/23 127/76  10/13/23 133/75   Wt Readings from Last 3 Encounters:  01/06/24 141 lb 6.4 oz (64.1 kg)  09/07/23 149 lb (67.6 kg)  07/07/23 154 lb (69.9 kg)      Physical Exam Constitutional:      Appearance: Normal appearance.  HENT:     Head: Normocephalic.  Cardiovascular:     Rate and Rhythm: Normal rate and regular rhythm.  Pulmonary:     Effort: Pulmonary effort is normal.     Breath sounds: Normal breath sounds.  Musculoskeletal:     Right lower leg: Edema present.     Left lower leg: Edema present.     Comments: 2+ edema of bilateral legs. Tender to touch. Slightly erythematous without warmth.   Neurological:     General: No focal deficit present.     Mental Status: She is alert and oriented to person, place, and time.  Psychiatric:        Mood and Affect: Mood normal.      The 10-year ASCVD risk score (Arnett DK, et al., 2019) is: 0.8%    Assessment & Plan:  Aaron AasAaron AasMargaret "Glynis Lass" was seen today for nevus and fatigue.  Diagnoses and all orders for this visit:  Bilateral lower extremity edema -     CBC w/Diff/Platelet -     B12 and Folate Panel -     Fe+TIBC+Fer -     CMP14+EGFR -     VITAMIN D  25 Hydroxy (Vit-D Deficiency, Fractures) -     TSH + free T4 -     furosemide  (LASIX ) 20 MG tablet; Take one tablet as needed for lower extremity swelling. -     Sed Rate (ESR) -     C-reactive protein -     POCT UA - Microalbumin  Colon cancer screening -     Cologuard  Encounter for screening  mammogram for malignant neoplasm of breast  Immunization due -     Pneumococcal conjugate vaccine 20-valent (Prevnar 20)  No energy -     CBC w/Diff/Platelet -     B12 and Folate Panel -     Fe+TIBC+Fer -     CMP14+EGFR -     VITAMIN D  25 Hydroxy (Vit-D Deficiency, Fractures) -     TSH + free T4 -     Sed Rate (ESR) -     C-reactive protein  Anemia, unspecified type -     CBC w/Diff/Platelet -     POCT UA - Microalbumin  Vitamin D  deficiency -     VITAMIN D  25 Hydroxy (Vit-D Deficiency, Fractures) -     POCT UA - Microalbumin  Solar lentigo   Vital signs stable today No protein in urine Likely swelling from chronic venous stasis Continue HcTZ and increase to 25mg  when swelling increased If needed use lasix  Keep feet elevated and wear compression stockings Lab ordered for fatigue and to look at kidney function Follow up as needed if symptoms progress or worsen.   Discussed normal finding of solar lentigo.    Mikah Poss, PA-C

## 2024-01-06 NOTE — Patient Instructions (Addendum)
 Lasix  as needed for lower extremity swelling consider compression stockings  Peripheral Edema  Peripheral edema is swelling that is caused by a buildup of fluid. Peripheral edema most often affects the lower legs, ankles, and feet. It can also develop in the arms, hands, and face. The area of the body that has peripheral edema will look swollen. It may also feel heavy or warm. Your clothes may start to feel tight. Pressing on the area may make a temporary dent in your skin (pitting edema). You may not be able to move your swollen arm or leg as much as usual. There are many causes of peripheral edema. It can happen because of a complication of other conditions such as heart failure, kidney disease, or a problem with your circulation. It also can be a side effect of certain medicines or happen because of an infection. It often happens to women during pregnancy. Sometimes, the cause is not known. Follow these instructions at home: Managing pain, stiffness, and swelling  Raise (elevate) your legs while you are sitting or lying down. Move around often to prevent stiffness and to reduce swelling. Do not sit or stand for long periods of time. Do not wear tight clothing. Do not wear garters on your upper legs. Exercise your legs to get your circulation going. This helps to move the fluid back into your blood vessels, and it may help the swelling go down. Wear compression stockings as told by your health care provider. These stockings help to prevent blood clots and reduce swelling in your legs. It is important that these are the correct size. These stockings should be prescribed by your doctor to prevent possible injuries. If elastic bandages or wraps are recommended, use them as told by your health care provider. Medicines Take over-the-counter and prescription medicines only as told by your health care provider. Your health care provider may prescribe medicine to help your body get rid of excess water  (diuretic). Take this medicine if you are told to take it. General instructions Eat a low-salt (low-sodium) diet as told by your health care provider. Sometimes, eating less salt may reduce swelling. Pay attention to any changes in your symptoms. Moisturize your skin daily to help prevent skin from cracking and draining. Keep all follow-up visits. This is important. Contact a health care provider if: You have a fever. You have swelling in only one leg. You have increased swelling, redness, or pain in one or both of your legs. You have drainage or sores at the area where you have edema. Get help right away if: You have edema that starts suddenly or is getting worse, especially if you are pregnant or have a medical condition. You develop shortness of breath, especially when you are lying down. You have pain in your chest or abdomen. You feel weak. You feel like you will faint. These symptoms may be an emergency. Get help right away. Call 911. Do not wait to see if the symptoms will go away. Do not drive yourself to the hospital. Summary Peripheral edema is swelling that is caused by a buildup of fluid. Peripheral edema most often affects the lower legs, ankles, and feet. Move around often to prevent stiffness and to reduce swelling. Do not sit or stand for long periods of time. Pay attention to any changes in your symptoms. Contact a health care provider if you have edema that starts suddenly or is getting worse, especially if you are pregnant or have a medical condition. Get help right  away if you develop shortness of breath, especially when lying down. This information is not intended to replace advice given to you by your health care provider. Make sure you discuss any questions you have with your health care provider. Document Revised: 04/08/2021 Document Reviewed: 04/08/2021 Elsevier Patient Education  2024 [employer].

## 2024-01-07 LAB — CMP14+EGFR
ALT: 25 IU/L (ref 0–32)
AST: 19 IU/L (ref 0–40)
Albumin: 2.9 g/dL — ABNORMAL LOW (ref 3.8–4.9)
Alkaline Phosphatase: 116 IU/L (ref 44–121)
BUN/Creatinine Ratio: 33 — ABNORMAL HIGH (ref 9–23)
BUN: 12 mg/dL (ref 6–24)
Bilirubin Total: 0.3 mg/dL (ref 0.0–1.2)
CO2: 25 mmol/L (ref 20–29)
Calcium: 7.7 mg/dL — ABNORMAL LOW (ref 8.7–10.2)
Chloride: 104 mmol/L (ref 96–106)
Creatinine, Ser: 0.36 mg/dL — ABNORMAL LOW (ref 0.57–1.00)
Globulin, Total: 2 g/dL (ref 1.5–4.5)
Glucose: 70 mg/dL (ref 70–99)
Potassium: 3.7 mmol/L (ref 3.5–5.2)
Sodium: 140 mmol/L (ref 134–144)
Total Protein: 4.9 g/dL — ABNORMAL LOW (ref 6.0–8.5)
eGFR: 122 mL/min/{1.73_m2} (ref >59)

## 2024-01-07 LAB — CBC WITH DIFFERENTIAL/PLATELET
Basophils Absolute: 0.1 10*3/uL (ref 0.0–0.2)
Basos: 1 %
EOS (ABSOLUTE): 0.1 10*3/uL (ref 0.0–0.4)
Eos: 1 %
Hematocrit: 28.1 % — ABNORMAL LOW (ref 34.0–46.6)
Hemoglobin: 8.6 g/dL — ABNORMAL LOW (ref 11.1–15.9)
Immature Grans (Abs): 0 10*3/uL (ref 0.0–0.1)
Immature Granulocytes: 0 %
Lymphocytes Absolute: 2.4 10*3/uL (ref 0.7–3.1)
Lymphs: 23 %
MCH: 27 pg (ref 26.6–33.0)
MCHC: 30.6 g/dL — ABNORMAL LOW (ref 31.5–35.7)
MCV: 88 fL (ref 79–97)
Monocytes Absolute: 0.9 10*3/uL (ref 0.1–0.9)
Monocytes: 9 %
Neutrophils Absolute: 6.9 10*3/uL (ref 1.4–7.0)
Neutrophils: 66 %
Platelets: 395 10*3/uL (ref 150–450)
RBC: 3.19 x10E6/uL — ABNORMAL LOW (ref 3.77–5.28)
RDW: 18.1 % — ABNORMAL HIGH (ref 11.7–15.4)
WBC: 10.4 10*3/uL (ref 3.4–10.8)

## 2024-01-07 LAB — TSH+FREE T4
Free T4: 1.24 ng/dL (ref 0.82–1.77)
TSH: 3.31 u[IU]/mL (ref 0.450–4.500)

## 2024-01-07 LAB — VITAMIN D 25 HYDROXY (VIT D DEFICIENCY, FRACTURES): Vit D, 25-Hydroxy: 23 ng/mL — ABNORMAL LOW (ref 30.0–100.0)

## 2024-01-07 LAB — IRON,TIBC AND FERRITIN PANEL
Ferritin: 27 ng/mL (ref 15–150)
Iron Saturation: 14 % — ABNORMAL LOW (ref 15–55)
Iron: 34 ug/dL (ref 27–159)
Total Iron Binding Capacity: 247 ug/dL — ABNORMAL LOW (ref 250–450)
UIBC: 213 ug/dL (ref 131–425)

## 2024-01-07 LAB — B12 AND FOLATE PANEL
Folate: 7.4 ng/mL (ref >3.0)
Vitamin B-12: 985 pg/mL (ref 232–1245)

## 2024-01-07 LAB — C-REACTIVE PROTEIN: CRP: 3 mg/L (ref 0–10)

## 2024-01-07 LAB — SEDIMENTATION RATE: Sed Rate: 2 mm/h (ref 0–40)

## 2024-01-08 ENCOUNTER — Other Ambulatory Visit: Payer: Self-pay

## 2024-01-08 ENCOUNTER — Ambulatory Visit: Payer: Self-pay | Admitting: Physician Assistant

## 2024-01-08 DIAGNOSIS — E559 Vitamin D deficiency, unspecified: Secondary | ICD-10-CM

## 2024-01-08 DIAGNOSIS — R944 Abnormal results of kidney function studies: Secondary | ICD-10-CM

## 2024-01-08 MED ORDER — VITAMIN D (ERGOCALCIFEROL) 1.25 MG (50000 UNIT) PO CAPS: 50000.0000 [IU] | ORAL_CAPSULE | ORAL | 1 refills | Status: DC

## 2024-01-08 NOTE — Progress Notes (Signed)
 Vitamin D  is low. Start back on once weekly vitamin D .  Albumin(protein) is low this could be why you are swelling. You have to increase protein in diet to 80g a day. Even if you have to drink protein replacement shakes.  Calcium  is low. Are you taking extra calcium ?  Hemoglobin is low as well. Increase to twice a day. Recheck in one month or sooner if not feeling any better.  Are you having any blood in stool or urine?   For nursing staff:  Can we add PTH(parathyroid hormone lab)

## 2024-01-10 ENCOUNTER — Other Ambulatory Visit: Payer: Self-pay | Admitting: Physician Assistant

## 2024-01-10 DIAGNOSIS — I7300 Raynaud's syndrome without gangrene: Secondary | ICD-10-CM

## 2024-01-10 DIAGNOSIS — F988 Other specified behavioral and emotional disorders with onset usually occurring in childhood and adolescence: Secondary | ICD-10-CM

## 2024-01-12 ENCOUNTER — Encounter: Payer: Self-pay | Admitting: Physician Assistant

## 2024-01-12 ENCOUNTER — Ambulatory Visit: Payer: Medicaid Other | Admitting: Physician Assistant

## 2024-01-12 DIAGNOSIS — F988 Other specified behavioral and emotional disorders with onset usually occurring in childhood and adolescence: Secondary | ICD-10-CM

## 2024-01-12 DIAGNOSIS — L814 Other melanin hyperpigmentation: Secondary | ICD-10-CM | POA: Insufficient documentation

## 2024-01-12 DIAGNOSIS — G894 Chronic pain syndrome: Secondary | ICD-10-CM

## 2024-01-12 MED ORDER — AMPHETAMINE-DEXTROAMPHETAMINE 20 MG PO TABS
ORAL_TABLET | ORAL | 0 refills | Status: DC
Start: 2024-01-12 — End: 2024-02-09

## 2024-01-12 MED ORDER — AMPHETAMINE-DEXTROAMPHET ER 20 MG PO CP24: 20.0000 mg | ORAL_CAPSULE | ORAL | 0 refills | Status: DC

## 2024-01-12 MED ORDER — OXYCODONE HCL 15 MG PO TABS: 15.0000 mg | ORAL_TABLET | Freq: Three times a day (TID) | ORAL | 0 refills | Status: DC | PRN

## 2024-01-12 NOTE — Telephone Encounter (Signed)
 Forwarding message to Dr. Greer Leak covering [facility] Crumb Requesting rx rf of  Adderall 20mg   Last written 12/12/2023 Adderall XR 20mg   Last written 12/12/2023 Oxycodone   Last written 12/12/2023 Last OV 01/06/2024 Upcoming appt 01/15/2024

## 2024-01-12 NOTE — Telephone Encounter (Signed)
 Request forwarded to Dr. Greer Leak covering [facility] Crumb in separate message.

## 2024-01-13 ENCOUNTER — Telehealth: Payer: Self-pay

## 2024-01-13 ENCOUNTER — Other Ambulatory Visit (HOSPITAL_COMMUNITY): Payer: Self-pay

## 2024-01-13 NOTE — Telephone Encounter (Signed)
 Duplicate request

## 2024-01-13 NOTE — Telephone Encounter (Signed)
 Pharmacy Patient Advocate Encounter   Received notification from CoverMyMeds that prior authorization for oxyCODONE  HCl 15MG  tablets is required/requested.   Insurance verification completed.   The patient is insured through [facility] [place] .   Per test claim: PA required; PA submitted to above mentioned insurance via CoverMyMeds Key/confirmation #/EOC [id number] Status is pending

## 2024-01-14 ENCOUNTER — Other Ambulatory Visit (HOSPITAL_COMMUNITY): Payer: Self-pay

## 2024-01-14 NOTE — Telephone Encounter (Signed)
 Pharmacy Patient Advocate Encounter  Received notification from [employer] [facility] Medicaid that Prior Authorization for oxyCODONE  HCl 15MG  tablets  has been APPROVED from 01/13/24 to 07/11/24. Ran test claim, Copay is $4. This test claim was processed through [facility]- copay amounts may vary at other pharmacies due to pharmacy/plan contracts, or as the patient moves through the different stages of their insurance plan.   PA #/Case ID/Reference #: [id number]

## 2024-01-15 ENCOUNTER — Ambulatory Visit: Admitting: Physician Assistant

## 2024-01-15 DIAGNOSIS — F988 Other specified behavioral and emotional disorders with onset usually occurring in childhood and adolescence: Secondary | ICD-10-CM

## 2024-01-15 DIAGNOSIS — R944 Abnormal results of kidney function studies: Secondary | ICD-10-CM

## 2024-01-15 DIAGNOSIS — G894 Chronic pain syndrome: Secondary | ICD-10-CM

## 2024-01-15 DIAGNOSIS — E559 Vitamin D deficiency, unspecified: Secondary | ICD-10-CM

## 2024-02-08 ENCOUNTER — Other Ambulatory Visit: Payer: Self-pay | Admitting: Family Medicine

## 2024-02-08 ENCOUNTER — Encounter: Payer: Self-pay | Admitting: Physician Assistant

## 2024-02-08 DIAGNOSIS — F988 Other specified behavioral and emotional disorders with onset usually occurring in childhood and adolescence: Secondary | ICD-10-CM

## 2024-02-09 MED ORDER — AMPHETAMINE-DEXTROAMPHET ER 20 MG PO CP24: 20.0000 mg | ORAL_CAPSULE | ORAL | 0 refills | Status: DC

## 2024-02-09 MED ORDER — AMPHETAMINE-DEXTROAMPHET ER 20 MG PO CP24
20.0000 mg | ORAL_CAPSULE | ORAL | 0 refills | Status: DC
Start: 2024-02-09 — End: 2024-04-22

## 2024-02-09 MED ORDER — AMPHETAMINE-DEXTROAMPHETAMINE 20 MG PO TABS: ORAL_TABLET | ORAL | 0 refills | Status: DC

## 2024-02-09 NOTE — Telephone Encounter (Signed)
 No concerns. Adderall refills sent.

## 2024-02-25 ENCOUNTER — Encounter: Payer: Self-pay | Admitting: Physician Assistant

## 2024-02-26 ENCOUNTER — Telehealth

## 2024-02-26 ENCOUNTER — Ambulatory Visit (INDEPENDENT_AMBULATORY_CARE_PROVIDER_SITE_OTHER): Admitting: Physician Assistant

## 2024-02-26 ENCOUNTER — Encounter: Payer: Self-pay | Admitting: Physician Assistant

## 2024-02-26 VITALS — BP 118/79 | HR 110 | Temp 100.0°F | Ht 65.0 in | Wt 150.0 lb

## 2024-02-26 DIAGNOSIS — J029 Acute pharyngitis, unspecified: Secondary | ICD-10-CM | POA: Diagnosis not present

## 2024-02-26 DIAGNOSIS — J40 Bronchitis, not specified as acute or chronic: Secondary | ICD-10-CM | POA: Diagnosis not present

## 2024-02-26 DIAGNOSIS — J329 Chronic sinusitis, unspecified: Secondary | ICD-10-CM | POA: Diagnosis not present

## 2024-02-26 DIAGNOSIS — R6889 Other general symptoms and signs: Secondary | ICD-10-CM | POA: Diagnosis not present

## 2024-02-26 LAB — POC COVID19 BINAXNOW: SARS Coronavirus 2 Ag: NEGATIVE

## 2024-02-26 LAB — POCT INFLUENZA A/B
Influenza A, POC: NEGATIVE
Influenza B, POC: NEGATIVE

## 2024-02-26 LAB — POCT RAPID STREP A (OFFICE): Rapid Strep A Screen: NEGATIVE

## 2024-02-26 MED ORDER — AZITHROMYCIN 250 MG PO TABS: ORAL_TABLET | ORAL | 0 refills | Status: DC

## 2024-02-26 MED ORDER — METHYLPREDNISOLONE 4 MG PO TBPK: ORAL_TABLET | ORAL | 0 refills | Status: DC

## 2024-02-26 MED ORDER — HYDROCOD POLI-CHLORPHE POLI ER 10-8 MG/5ML PO SUER: ORAL | 0 refills | Status: DC

## 2024-02-26 NOTE — Telephone Encounter (Signed)
Ok to double book at 3:20.

## 2024-02-26 NOTE — Telephone Encounter (Signed)
 Patient scheduled.

## 2024-02-26 NOTE — Progress Notes (Signed)
 Acute Office Visit  Subjective:     Patient ID: Kimberly Wade, female    DOB: [birth year], 53 y.o.   MRN: [id number]  No chief complaint on file.   Patient is in today for a possible strep throat infection. Patient's son was diagnosed and treated for strep last week and she believes she may have gotten sick from him. She endorses having a headache, sore throat, cough, and rhinorrhea, she also states she is feeling very fatigued and appears to be very uncomfortable and acutely ill in the office. She has been staying hydrated by drinking lots of fluids and has been able to eat. She states the most bothersome symptom is her cough at night, which is interfering with her sleep. She has tried Tylenol , which helps slightly with her headache, but states all over the counter cough medicines she has tried do not help her. Patient states she needs some type of medication to get her through the weekend because she needs to be able to take care of her son and drive her husband to work.   Review of Systems  Constitutional:  Positive for malaise/fatigue. Negative for chills.       Low grade fever  HENT:  Positive for sore throat. Negative for congestion and sinus pain.        Rhinorrhea  Respiratory:  Positive for cough. Negative for sputum production and shortness of breath.   Gastrointestinal:  Negative for vomiting.  Genitourinary:  Negative for dysuria.  Neurological:  Positive for headaches.  All other systems reviewed and are negative.     Objective:    BP 118/79   Pulse (!) 110   Temp 100 F (37.8 C) (Oral)   Ht 5' 5 (1.651 m)   Wt 68 kg   SpO2 99%   BMI 24.96 kg/m  BP Readings from Last 3 Encounters:  02/26/24 118/79  01/06/24 (!) 104/58  11/16/23 127/76   Physical Exam Vitals reviewed.  Constitutional:      Appearance: She is ill-appearing.  HENT:     Head: Normocephalic and atraumatic.     Nose: Rhinorrhea present.     Mouth/Throat:     Mouth: Mucous membranes are  moist.     Pharynx: Posterior oropharyngeal erythema present.  Cardiovascular:     Rate and Rhythm: Regular rhythm. Tachycardia present.     Pulses: Normal pulses.     Heart sounds: Normal heart sounds.  Pulmonary:     Effort: Pulmonary effort is normal.     Breath sounds: Normal breath sounds.  Musculoskeletal:     Cervical back: Normal range of motion.  Lymphadenopathy:     Cervical: Cervical adenopathy present.  Skin:    General: Skin is warm.  Neurological:     Mental Status: She is alert.     Results for orders placed or performed in visit on 02/26/24  POC COVID-19  Result Value Ref Range   SARS Coronavirus 2 Ag Negative Negative  POCT rapid strep A  Result Value Ref Range   Rapid Strep A Screen Negative Negative  POCT Influenza A/B  Result Value Ref Range   Influenza A, POC Negative Negative   Influenza B, POC Negative Negative        Assessment & Plan:   Problem List Items Addressed This Visit   None Visit Diagnoses       Sore throat    -  Primary   Relevant Orders   POC COVID-19 (Completed)   POCT rapid  strep A (Completed)   POCT Influenza A/B (Completed)     Flu-like symptoms       Relevant Orders   POC COVID-19 (Completed)   POCT rapid strep A (Completed)   POCT Influenza A/B (Completed)       Meds ordered this encounter  Medications   methylPREDNISolone  (MEDROL  DOSEPAK) 4 MG TBPK tablet    Sig: Take as directed by package insert.    Dispense:  21 tablet    Refill:  0    Supervising Provider:   METHENEY, CATHERINE D [[id number]]   chlorpheniramine-HYDROcodone  (TUSSIONEX) 10-8 MG/5ML    Sig: Take 5mL at bedtime for cough.  do not combine with oxycodone ).    Dispense:  70 mL    Refill:  0    Supervising Provider:   METHENEY, CATHERINE D [[id number]]   azithromycin  (ZITHROMAX  Z-PAK) 250 MG tablet    Sig: Take 2 tablets (500 mg) on  Day 1,  followed by 1 tablet (250 mg) once daily on Days 2 through 5.    Dispense:  6 tablet    Refill:  0    Supervising  Provider:   METHENEY, CATHERINE D [[id number]]   Sore throat- strep throat exposure Flu-like symptoms - Covid, Strep, and Flu tests were negative  - Methylprednisolone  and Tussionex sent into pharmacy for symptomatic treatment -  Patient is on a pain contract (Roxicodone  for pain), informed patient to not take this medication while she is taking Tussionex for sx relief - Sent Z-pack into pharmacy, educated patient to pick this up and start if she does not improve in 24 hours - Encouraged continuing to stay hydrated, use OTC Chloraseptic spray and honey for throat soothing, resting as much as possible and continuing to take tylenol  to help with headache.   Return if symptoms worsen or fail to improve.  Tinnie FORBES Patient, Student-PA

## 2024-02-26 NOTE — Telephone Encounter (Signed)
 Ok to add virtually for 3:20 double book. She will need visit for abx.

## 2024-02-26 NOTE — Telephone Encounter (Signed)
 Left message for a return call

## 2024-02-26 NOTE — Patient Instructions (Signed)

## 2024-03-01 ENCOUNTER — Encounter: Payer: Self-pay | Admitting: Physician Assistant

## 2024-03-01 DIAGNOSIS — G894 Chronic pain syndrome: Secondary | ICD-10-CM

## 2024-03-02 ENCOUNTER — Other Ambulatory Visit: Payer: Self-pay | Admitting: Physician Assistant

## 2024-03-02 DIAGNOSIS — G894 Chronic pain syndrome: Secondary | ICD-10-CM

## 2024-03-02 MED ORDER — OXYCODONE HCL 15 MG PO TABS: 15.0000 mg | ORAL_TABLET | Freq: Three times a day (TID) | ORAL | 0 refills | Status: DC | PRN

## 2024-03-02 NOTE — Telephone Encounter (Unsigned)
 Copied from CRM [id number]. Topic: Clinical - Medication Refill >> Mar 02, 2024  1:15 PM Adrianna P wrote: Medication: oxyCODONE  (ROXICODONE ) 15 MG immediate release tablet [[id number]]  Has the patient contacted their pharmacy? Yes (Agent: If no, request that the patient contact the pharmacy for the refill. If patient does not wish to contact the pharmacy document the reason why and proceed with request.) (Agent: If yes, when and what did the pharmacy advise?)  This is the patient's preferred pharmacy:  [facility] [address] - [place], [place] - [address] [place] [address] Phone: [phone] Fax: [phone]  [facility] [phone] [place], [place] - [address] [place] [address] Phone: [phone] Fax: [phone]  Is this the correct pharmacy for this prescription? Yes If no, delete pharmacy and type the correct one.   Has the prescription been filled recently? No  Is the patient out of the medication? Yes  Has the patient been seen for an appointment in the last year OR does the patient have an upcoming appointment? Yes  Can we respond through MyChart? Yes  Agent: Please be advised that Rx refills may take up to 3 business days. We ask that you follow-up with your pharmacy.

## 2024-03-03 ENCOUNTER — Encounter: Payer: Self-pay | Admitting: Physician Assistant

## 2024-03-04 NOTE — Telephone Encounter (Signed)
 Medication sent to pharmacy

## 2024-03-09 ENCOUNTER — Encounter: Payer: Self-pay | Admitting: Physician Assistant

## 2024-03-09 DIAGNOSIS — J329 Chronic sinusitis, unspecified: Secondary | ICD-10-CM

## 2024-03-11 MED ORDER — PROMETHAZINE-DM 6.25-15 MG/5ML PO SYRP: 5.0000 mL | ORAL_SOLUTION | Freq: Three times a day (TID) | ORAL | 0 refills | Status: DC | PRN

## 2024-03-11 MED ORDER — ALBUTEROL SULFATE HFA 108 (90 BASE) MCG/ACT IN AERS: INHALATION_SPRAY | RESPIRATORY_TRACT | 0 refills | Status: AC

## 2024-03-11 MED ORDER — FLUTICASONE PROPIONATE 50 MCG/ACT NA SUSP: 2.0000 | Freq: Every day | NASAL | 2 refills | Status: AC

## 2024-03-11 NOTE — Telephone Encounter (Signed)
 Meds ordered this encounter  Medications   promethazine -dextromethorphan  (PROMETHAZINE -DM) 6.25-15 MG/5ML syrup    Sig: Take 5 mLs by mouth 3 (three) times daily as needed for cough.    Dispense:  118 mL    Refill:  0   albuterol  (VENTOLIN  HFA) 108 (90 Base) MCG/ACT inhaler    Sig: INHALE TWO PUFFS BY MOUTH EVERY 6 HOURS AS NEEDED    Dispense:  59.5 g    Refill:  0   fluticasone  (FLONASE ) 50 MCG/ACT nasal spray    Sig: Place 2 sprays into both nostrils daily.    Dispense:  16 g    Refill:  2

## 2024-03-15 NOTE — Addendum Note (Signed)
 Addended by: Abeeha Twist D on: 03/15/2024 08:18 AM   Modules accepted: Orders

## 2024-03-15 NOTE — Telephone Encounter (Signed)
 Tell her to go for CXR since still coughing  Orders Placed This Encounter  Procedures   DG Chest 2 View    Standing Status:   Future    Expiration Date:   03/15/2025    Reason for Exam (SYMPTOM  OR DIAGNOSIS REQUIRED):   persistant cough    Is patient pregnant?:   No    Preferred imaging location?:   [facility]

## 2024-03-17 ENCOUNTER — Ambulatory Visit: Payer: Self-pay | Admitting: Family Medicine

## 2024-03-17 ENCOUNTER — Ambulatory Visit

## 2024-03-17 DIAGNOSIS — J329 Chronic sinusitis, unspecified: Secondary | ICD-10-CM

## 2024-03-17 DIAGNOSIS — R058 Other specified cough: Secondary | ICD-10-CM

## 2024-03-17 DIAGNOSIS — J90 Pleural effusion, not elsewhere classified: Secondary | ICD-10-CM

## 2024-03-17 NOTE — Telephone Encounter (Signed)
 Pended referral

## 2024-03-17 NOTE — Progress Notes (Signed)
 Kimberly Wade, I am covering for [facility] while she is out of the office and wanted to let you know that your chest x-ray does not show any infection or pneumonia which is very reassuring but they did see just a little bit of fluid at the bottoms of the lungs.  I did look back at a chest x-ray from last fall and that was not there before.  I would really like to get you in with a lung specialist.  Have you seen a pulmonologist in the past?  Have a preference for provider or location?

## 2024-03-28 ENCOUNTER — Other Ambulatory Visit: Payer: Self-pay | Admitting: Physician Assistant

## 2024-03-28 DIAGNOSIS — Z1231 Encounter for screening mammogram for malignant neoplasm of breast: Secondary | ICD-10-CM

## 2024-03-29 ENCOUNTER — Encounter: Payer: Self-pay | Admitting: Physician Assistant

## 2024-03-29 DIAGNOSIS — G894 Chronic pain syndrome: Secondary | ICD-10-CM

## 2024-03-30 ENCOUNTER — Other Ambulatory Visit: Payer: Self-pay

## 2024-03-30 DIAGNOSIS — G894 Chronic pain syndrome: Secondary | ICD-10-CM

## 2024-03-30 NOTE — Telephone Encounter (Signed)
..  PDMP reviewed during this encounter. Due next on 8/16 but due to vacation. Call and ok pick up early for 8/14.

## 2024-03-30 NOTE — Addendum Note (Signed)
 Addended by: ANTONIETTE VERMELL CROME on: 03/30/2024 03:59 PM   Modules accepted: Orders

## 2024-03-30 NOTE — Telephone Encounter (Signed)
 I think this is a duplicate message.

## 2024-03-30 NOTE — Telephone Encounter (Signed)
 Copied from CRM #[id number]. Topic: Clinical - Medication Question >> Mar 30, 2024  1:17 PM Farrel B wrote: Reason for CRM: [id number], Ms. Canoy-Beck has called in regards to the medication listed below. She states they are leaving tomorrow to go out of town and wanted to see if her pcp could move the date for refill to be picked up tomorrow. Please call patient to advise.  oxyCODONE  (ROXICODONE ) 15 MG immediate release tablet

## 2024-03-31 ENCOUNTER — Telehealth: Payer: Self-pay

## 2024-03-31 MED ORDER — OXYCODONE HCL 15 MG PO TABS: 15.0000 mg | ORAL_TABLET | Freq: Three times a day (TID) | ORAL | 0 refills | Status: DC | PRN

## 2024-03-31 NOTE — Telephone Encounter (Signed)
 This request has been handled by the covering provided. No further action is required. Please review other telephone encounters for additional information.

## 2024-03-31 NOTE — Telephone Encounter (Signed)
 Spoke with pharmacy  Was told that the original script has a hard stop as written do not fill before Aug 15th on the script and they can not take a verbal to change this . Will need a new script sent without the do not fill date included for the early fill to be done.

## 2024-03-31 NOTE — Addendum Note (Signed)
 Addended by: Barry Culverhouse P on: 03/31/2024 10:22 AM   Modules accepted: Orders

## 2024-03-31 NOTE — Telephone Encounter (Signed)
 Med sesnt one day early

## 2024-03-31 NOTE — Telephone Encounter (Signed)
 Spoke with Vermell Spence, PA - she did not have her computer to send the prescription with her so she had requested that Dr. Alvan please send the prescription for her .  DR. Metheney has done this.

## 2024-03-31 NOTE — Telephone Encounter (Signed)
 Copied from CRM #[id number]. Topic: Clinical - Medication Question >> Mar 30, 2024  1:17 PM Farrel B wrote: Reason for CRM: [id number], Ms. Canoy-Beck has called in regards to the medication listed below. She states they are leaving tomorrow to go out of town and wanted to see if her pcp could move the date for refill to be picked up tomorrow. Please call patient to advise.  oxyCODONE  (ROXICODONE ) 15 MG immediate release tablet >> Mar 31, 2024  9:42 AM Miquel SAILOR wrote: Patient calling on update for medication refilled early. Let PT know and  confirmed by office will be in tomorrow. Patient need this refill by today. PCP states someone has to call pharmacy to early refill. Due to going on trip today at 11am  08/14. [phone] oxyCODONE  (ROXICODONE ) 15 MG immediate release tablet 90 tablet 0 04/01/2024 --   Sig - Route: Take 1 tablet (15 mg total) by mouth every 8 (eight) hours as needed for pain. - Oral   Sent to pharmacy as: oxyCODONE  (ROXICODONE ) 15 MG immediate release tablet   Earliest Fill Date: 04/01/2024   Notes to Pharmacy: To replace the percocet. On chronic pain contract.   E-Prescribing Status: Receipt confirmed by pharmacy (03/02/2024  1:24 PM EDT)

## 2024-03-31 NOTE — Addendum Note (Signed)
 Addended by: Sydney Hasten D on: 03/31/2024 11:27 AM   Modules accepted: Orders

## 2024-03-31 NOTE — Telephone Encounter (Signed)
 Copied from CRM #[id number]. Topic: Clinical - Medication Question >> Mar 30, 2024  1:17 PM Farrel B wrote: Reason for CRM: [id number], Kimberly Wade has called in regards to the medication listed below. She states they are leaving tomorrow to go out of town and wanted to see if her pcp could move the date for refill to be picked up tomorrow. Please call patient to advise.  oxyCODONE  (ROXICODONE ) 15 MG immediate release tablet >> Mar 31, 2024  9:42 AM Miquel SAILOR wrote: Patient calling on update for medication refilled early. Let PT know and  confirmed by office will be in tomorrow. Patient need this refill by today. PCP states someone has to call pharmacy to early refill. Due to going on trip today at 11am  08/14. [phone] oxyCODONE  (ROXICODONE ) 15 MG immediate release tablet 90 tablet 0 04/01/2024 --   Sig - Route: Take 1 tablet (15 mg total) by mouth every 8 (eight) hours as needed for pain. - Oral   Sent to pharmacy as: oxyCODONE  (ROXICODONE ) 15 MG immediate release tablet   Earliest Fill Date: 04/01/2024   Notes to Pharmacy: To replace the percocet. On chronic pain contract.   E-Prescribing Status: Receipt confirmed by pharmacy (03/02/2024  1:24 PM EDT)

## 2024-04-06 ENCOUNTER — Encounter: Payer: Self-pay | Admitting: Physician Assistant

## 2024-04-06 DIAGNOSIS — F988 Other specified behavioral and emotional disorders with onset usually occurring in childhood and adolescence: Secondary | ICD-10-CM

## 2024-04-07 ENCOUNTER — Other Ambulatory Visit: Payer: Self-pay | Admitting: Physician Assistant

## 2024-04-07 DIAGNOSIS — F988 Other specified behavioral and emotional disorders with onset usually occurring in childhood and adolescence: Secondary | ICD-10-CM

## 2024-04-07 NOTE — Telephone Encounter (Signed)
 Requesting rx rf of Adderalll 20mg   and Adderall XR 20mg   Last written 03/10/2024 Last OV 02/26/2024 sick visit Upcoming appt = none

## 2024-04-07 NOTE — Telephone Encounter (Signed)
 Copied from CRM [id number]. Topic: Clinical - Medication Refill >> Apr 07, 2024  2:13 PM Susanna ORN wrote: Medication: amphetamine -dextroamphetamine  (ADDERALL XR) 20 MG 24 hr capsule   Has the patient contacted their pharmacy? No (Agent: If no, request that the patient contact the pharmacy for the refill. If patient does not wish to contact the pharmacy document the reason why and proceed with request.) (Agent: If yes, when and what did the pharmacy advise?)  This is the patient's preferred pharmacy:  [facility] [address] - [place], [place] - [address] [place] [address] Phone: [phone] Fax: [phone]  Is this the correct pharmacy for this prescription? Yes If no, delete pharmacy and type the correct one.   Has the prescription been filled recently? Yes  Is the patient out of the medication? N/A  Has the patient been seen for an appointment in the last year OR does the patient have an upcoming appointment? Yes  Can we respond through MyChart? Yes  Agent: Please be advised that Rx refills may take up to 3 business days. We ask that you follow-up with your pharmacy.

## 2024-04-08 MED ORDER — AMPHETAMINE-DEXTROAMPHETAMINE 20 MG PO TABS: ORAL_TABLET | ORAL | 0 refills | Status: DC

## 2024-04-08 MED ORDER — AMPHETAMINE-DEXTROAMPHET ER 20 MG PO CP24: 20.0000 mg | ORAL_CAPSULE | ORAL | 0 refills | Status: DC

## 2024-04-08 NOTE — Telephone Encounter (Signed)
 Sent!

## 2024-04-12 NOTE — Telephone Encounter (Signed)
 Last filled 04/08/2024  Last OV 02/26/2024

## 2024-04-19 ENCOUNTER — Encounter: Payer: Self-pay | Admitting: Sports Medicine

## 2024-04-19 ENCOUNTER — Ambulatory Visit: Admitting: Student in an Organized Health Care Education/Training Program

## 2024-04-22 ENCOUNTER — Ambulatory Visit (INDEPENDENT_AMBULATORY_CARE_PROVIDER_SITE_OTHER): Admitting: Physician Assistant

## 2024-04-22 ENCOUNTER — Encounter: Payer: Self-pay | Admitting: Physician Assistant

## 2024-04-22 VITALS — BP 145/75 | HR 96 | Ht 65.0 in | Wt 135.0 lb

## 2024-04-22 DIAGNOSIS — I10 Essential (primary) hypertension: Secondary | ICD-10-CM

## 2024-04-22 DIAGNOSIS — F419 Anxiety disorder, unspecified: Secondary | ICD-10-CM

## 2024-04-22 DIAGNOSIS — F988 Other specified behavioral and emotional disorders with onset usually occurring in childhood and adolescence: Secondary | ICD-10-CM | POA: Diagnosis not present

## 2024-04-22 DIAGNOSIS — G894 Chronic pain syndrome: Secondary | ICD-10-CM

## 2024-04-22 DIAGNOSIS — F5101 Primary insomnia: Secondary | ICD-10-CM

## 2024-04-22 DIAGNOSIS — Z23 Encounter for immunization: Secondary | ICD-10-CM

## 2024-04-22 DIAGNOSIS — E559 Vitamin D deficiency, unspecified: Secondary | ICD-10-CM

## 2024-04-22 DIAGNOSIS — Z79899 Other long term (current) drug therapy: Secondary | ICD-10-CM

## 2024-04-22 DIAGNOSIS — F330 Major depressive disorder, recurrent, mild: Secondary | ICD-10-CM | POA: Diagnosis not present

## 2024-04-22 DIAGNOSIS — E876 Hypokalemia: Secondary | ICD-10-CM

## 2024-04-22 MED ORDER — OXYCODONE HCL 15 MG PO TABS: 15.0000 mg | ORAL_TABLET | Freq: Three times a day (TID) | ORAL | 0 refills | Status: AC | PRN

## 2024-04-22 MED ORDER — AMPHETAMINE-DEXTROAMPHET ER 20 MG PO CP24: 20.0000 mg | ORAL_CAPSULE | ORAL | 0 refills | Status: AC

## 2024-04-22 MED ORDER — OXYCODONE HCL 15 MG PO TABS: 15.0000 mg | ORAL_TABLET | Freq: Three times a day (TID) | ORAL | 0 refills | Status: DC | PRN

## 2024-04-22 MED ORDER — AMPHETAMINE-DEXTROAMPHETAMINE 20 MG PO TABS: ORAL_TABLET | ORAL | 0 refills | Status: DC

## 2024-04-22 MED ORDER — AMPHETAMINE-DEXTROAMPHETAMINE 20 MG PO TABS: ORAL_TABLET | ORAL | 0 refills | Status: AC

## 2024-04-22 MED ORDER — AMPHETAMINE-DEXTROAMPHET ER 20 MG PO CP24: 20.0000 mg | ORAL_CAPSULE | ORAL | 0 refills | Status: DC

## 2024-04-22 MED ORDER — VENLAFAXINE HCL ER 75 MG PO CP24: ORAL_CAPSULE | ORAL | 1 refills | Status: AC

## 2024-04-22 MED ORDER — MIRTAZAPINE 7.5 MG PO TABS: 7.5000 mg | ORAL_TABLET | Freq: Every day | ORAL | 1 refills | Status: AC

## 2024-04-22 MED ORDER — HYDROXYZINE HCL 25 MG PO TABS: 25.0000 mg | ORAL_TABLET | Freq: Three times a day (TID) | ORAL | 2 refills | Status: AC | PRN

## 2024-04-22 MED ORDER — ARIPIPRAZOLE 15 MG PO TABS: 15.0000 mg | ORAL_TABLET | Freq: Every day | ORAL | 1 refills | Status: AC

## 2024-04-22 MED ORDER — PREGABALIN 75 MG PO CAPS
75.0000 mg | ORAL_CAPSULE | Freq: Two times a day (BID) | ORAL | 2 refills | Status: AC
Start: 2024-04-22 — End: ?

## 2024-04-22 MED ORDER — AMLODIPINE BESYLATE 5 MG PO TABS: 5.0000 mg | ORAL_TABLET | Freq: Every day | ORAL | 1 refills | Status: AC

## 2024-04-22 MED ORDER — POTASSIUM CHLORIDE CRYS ER 20 MEQ PO TBCR: 20.0000 meq | EXTENDED_RELEASE_TABLET | Freq: Every day | ORAL | 1 refills | Status: AC

## 2024-04-22 NOTE — Patient Instructions (Addendum)
 Increased norvasc  to 5mg  for BP control Will make referral to pain clinic Can fill rx on 9/9 Start lyrica  twice a day

## 2024-04-22 NOTE — Progress Notes (Signed)
 Established Patient Office Visit  Subjective   Patient ID: Kimberly Wade, female    DOB: 11/30/1970  Age: 53 y.o. MRN: [id number]  Chief Complaint  Patient presents with   Medical Management of Chronic Issues    Chroinc pain and adhd , med refill    HPI Pt is a 53 yo female with ADHD, MDD, anxiety, HTN, hx of DVT, chronic pain who presents to the clinic for medication refills.   Pt is on adderall for ADHD and doing well. Last fill was 8/22. Denies any increase in anxiety or problems sleeping. No HA's or palpitations.   She takes oxycodone  TID for chronic pain. She does not feel like it controls her pain. She has not been to pain clinic in a while. Gabapentin  makes her too sleepy. She has not tried lyrica . She is more active since starting volunteering.   Denies any migraines recently.   Her mood is ok. No concerns. She is not checking her BP at home.    ROS See HPI.    Objective:     BP (!) 145/75   Pulse 96   Ht 5' 5 (1.651 m)   Wt 135 lb (61.2 kg)   SpO2 99%   BMI 22.47 kg/m  BP Readings from Last 3 Encounters:  04/22/24 (!) 145/75  02/26/24 118/79  01/06/24 (!) 104/58   Wt Readings from Last 3 Encounters:  04/22/24 135 lb (61.2 kg)  02/26/24 150 lb (68 kg)  01/06/24 141 lb 6.4 oz (64.1 kg)    ..    04/22/2024    8:35 AM 10/13/2023   10:29 AM 07/07/2023    9:53 AM 04/01/2023    7:51 AM 11/12/2022   11:08 AM  Depression screen PHQ 2/9  Decreased Interest 1 1 1  0 2  Down, Depressed, Hopeless 1 1 1  0 2  PHQ - 2 Score 2 2 2  0 4  Altered sleeping 1 1 2  1   Tired, decreased energy 2 2 2  3   Change in appetite 1 2 1  3   Feeling bad or failure about yourself  2 1 1  2   Trouble concentrating 2 2 2  1   Moving slowly or fidgety/restless 0 0 2  1  Suicidal thoughts 0 0 0  1  PHQ-9 Score 10 10 12  16   Difficult doing work/chores   Very difficult  Extremely dIfficult   ..    04/22/2024    8:35 AM 10/13/2023   10:29 AM 07/07/2023    9:53 AM 11/12/2022    11:08 AM  GAD 7 : Generalized Anxiety Score  Nervous, Anxious, on Edge 2 1 2 2   Control/stop worrying 2 1 2 2   Worry too much - different things 1 1 2 2   Trouble relaxing 1 1 2 2   Restless 0 0 2 1  Easily annoyed or irritable 1 1 3 3   Afraid - awful might happen 0 0 1 0  Total GAD 7 Score 7 5 14 12   Anxiety Difficulty Very difficult Very difficult Extremely difficult Extremely difficult      Physical Exam Constitutional:      Appearance: Normal appearance.  HENT:     Head: Normocephalic.  Cardiovascular:     Rate and Rhythm: Normal rate and regular rhythm.  Pulmonary:     Effort: Pulmonary effort is normal.     Breath sounds: Normal breath sounds.  Neurological:     General: No focal deficit present.  Mental Status: She is alert.  Psychiatric:        Mood and Affect: Mood normal.       The 10-year ASCVD risk score (Arnett DK, et al., 2019) is: 1.7%    Assessment & Plan:  Kimberly Wade was seen today for medical management of chronic issues.  Diagnoses and all orders for this visit:  Attention deficit disorder (ADD) without hyperactivity -     amphetamine -dextroamphetamine  (ADDERALL XR) 20 MG 24 hr capsule; Take 1 capsule (20 mg total) by mouth every morning. -     amphetamine -dextroamphetamine  (ADDERALL) 20 MG tablet; Take one tablet at 1pm. -     amphetamine -dextroamphetamine  (ADDERALL XR) 20 MG 24 hr capsule; Take 1 capsule (20 mg total) by mouth every morning. -     amphetamine -dextroamphetamine  (ADDERALL XR) 20 MG 24 hr capsule; Take 1 capsule (20 mg total) by mouth every morning. -     amphetamine -dextroamphetamine  (ADDERALL) 20 MG tablet; Take one tablet at 1pm. -     amphetamine -dextroamphetamine  (ADDERALL) 20 MG tablet; Take one tablet at 1pm. -     Amphetamine  Conf, Ur  Chronic pain syndrome -     Drug Profile, Ur, 9 Drugs -     oxyCODONE  (ROXICODONE ) 15 MG immediate release tablet; Take 1 tablet (15 mg total) by mouth every 8 (eight) hours as  needed for pain. -     oxyCODONE  (ROXICODONE ) 15 MG immediate release tablet; Take 1 tablet (15 mg total) by mouth every 8 (eight) hours as needed for pain. -     oxyCODONE  (ROXICODONE ) 15 MG immediate release tablet; Take 1 tablet (15 mg total) by mouth every 8 (eight) hours as needed for pain. -     pregabalin  (LYRICA ) 75 MG capsule; Take 1 capsule (75 mg total) by mouth 2 (two) times daily. -     Drug Screen, Ur (12+Oxycodone +Crt) -     Specimen status report  Medication management -     Drug Profile, Ur, 9 Drugs -     VITAMIN D  25 Hydroxy (Vit-D Deficiency, Fractures) -     CMP14+EGFR -     PTH, Intact and Calcium   Vitamin D  deficiency -     VITAMIN D  25 Hydroxy (Vit-D Deficiency, Fractures)  Essential hypertension, benign -     CMP14+EGFR -     amLODipine  (NORVASC ) 5 MG tablet; Take 1 tablet (5 mg total) by mouth daily.  Serum calcium  elevated -     PTH, Intact and Calcium   Anxiety -     ARIPiprazole  (ABILIFY ) 15 MG tablet; Take 1 tablet (15 mg total) by mouth daily. -     venlafaxine  XR (EFFEXOR -XR) 75 MG 24 hr capsule; TAKE TWO CAPSULES BY MOUTH ONE TIME DAILY -     hydrOXYzine  (ATARAX ) 25 MG tablet; Take 1 tablet (25 mg total) by mouth every 8 (eight) hours as needed.  Mild episode of recurrent major depressive disorder (HCC) -     ARIPiprazole  (ABILIFY ) 15 MG tablet; Take 1 tablet (15 mg total) by mouth daily. -     venlafaxine  XR (EFFEXOR -XR) 75 MG 24 hr capsule; TAKE TWO CAPSULES BY MOUTH ONE TIME DAILY  Primary insomnia -     hydrOXYzine  (ATARAX ) 25 MG tablet; Take 1 tablet (25 mg total) by mouth every 8 (eight) hours as needed. -     mirtazapine  (REMERON ) 7.5 MG tablet; Take 1 tablet (7.5 mg total) by mouth at bedtime.  Immunization due -     Flu vaccine  trivalent PF, 6mos and older(Flulaval,Afluria,Fluarix,Fluzone)  Hypokalemia -     potassium chloride  SA (KLOR-CON  M) 20 MEQ tablet; Take 1 tablet (20 mEq total) by mouth daily.   Adderall refilled for ADHD.   GAD/PHQ scores stable refilled abilify /effexor /hydroxyzine  Remeron  refilled for sleep BP not to goal Increased amlodipine  to 5mg  daily Cmp ordered for medication management and to recheck potassium level   Pain not controlled per patient I do not want to increase or change medication Referral to pain clinic made Start lyrica  twice a day to see if helps with pain Oxycodone  refilled Pain contract signed today .SABRAPDMP reviewed during this encounter.  Flu shot given today.   Jaceyon Strole, PA-C

## 2024-04-25 ENCOUNTER — Ambulatory Visit: Admitting: Internal Medicine

## 2024-04-25 LAB — PTH, INTACT AND CALCIUM: PTH: 113 pg/mL — AB (ref 15–65)

## 2024-04-25 LAB — CMP14+EGFR
ALT: 18 IU/L (ref 0–32)
AST: 19 IU/L (ref 0–40)
Albumin: 3 g/dL — ABNORMAL LOW (ref 3.8–4.9)
Alkaline Phosphatase: 129 IU/L — ABNORMAL HIGH (ref 44–121)
BUN/Creatinine Ratio: 21 (ref 9–23)
BUN: 12 mg/dL (ref 6–24)
Bilirubin Total: 0.3 mg/dL (ref 0.0–1.2)
CO2: 20 mmol/L (ref 20–29)
Calcium: 7.8 mg/dL — ABNORMAL LOW (ref 8.7–10.2)
Chloride: 105 mmol/L (ref 96–106)
Creatinine, Ser: 0.56 mg/dL — ABNORMAL LOW (ref 0.57–1.00)
Globulin, Total: 2.6 g/dL (ref 1.5–4.5)
Glucose: 138 mg/dL — ABNORMAL HIGH (ref 70–99)
Potassium: 3 mmol/L — ABNORMAL LOW (ref 3.5–5.2)
Sodium: 138 mmol/L (ref 134–144)
Total Protein: 5.6 g/dL — ABNORMAL LOW (ref 6.0–8.5)
eGFR: 109 mL/min/1.73 (ref >59)

## 2024-04-25 LAB — VITAMIN D 25 HYDROXY (VIT D DEFICIENCY, FRACTURES): Vit D, 25-Hydroxy: 22.1 ng/mL — ABNORMAL LOW (ref 30.0–100.0)

## 2024-04-26 LAB — DRUG PROFILE, UR, 9 DRUGS (LABCORP)
Barbiturate Quant, Ur: NEGATIVE ng/mL
Benzodiazepine Quant, Ur: NEGATIVE ng/mL
Cannabinoid Quant, Ur: NEGATIVE ng/mL
Cocaine (Metab.): NEGATIVE ng/mL
Creatinine, Urine: 38.9 mg/dL (ref 20.0–300.0)
Methadone Screen, Urine: NEGATIVE ng/mL
Nitrite Urine, Quantitative: NEGATIVE ug/mL
OPIATE SCREEN URINE: NEGATIVE ng/mL
PCP Quant, Ur: NEGATIVE ng/mL
Propoxyphene: NEGATIVE ng/mL
pH, Urine: 6.3 (ref 4.5–8.9)

## 2024-04-26 LAB — AMPHETAMINE CONF, UR
Amphetamine GC/MS Conf: >3000 ng/mL
Amphetamine: POSITIVE — AB
Amphetamines: POSITIVE — AB
Methamphetamine: NEGATIVE

## 2024-04-29 ENCOUNTER — Ambulatory Visit: Payer: Self-pay

## 2024-04-29 DIAGNOSIS — E211 Secondary hyperparathyroidism, not elsewhere classified: Secondary | ICD-10-CM

## 2024-04-29 DIAGNOSIS — E559 Vitamin D deficiency, unspecified: Secondary | ICD-10-CM

## 2024-05-06 LAB — DRUG SCREEN, UR (12+OXYCODONE+CRT)
Amphetamine Scrn, Ur: POSITIVE — AB
BARBITURATE SCREEN URINE: NEGATIVE ng/mL
BENZODIAZEPINE SCREEN, URINE: NEGATIVE ng/mL
CANNABINOIDS UR QL SCN: NEGATIVE ng/mL
Cocaine (Metab) Scrn, Ur: NEGATIVE ng/mL
Creatinine(Crt), U: 38.9 mg/dL (ref 20.0–300.0)
Fentanyl, Urine: NEGATIVE pg/mL
Meperidine Screen, Urine: NEGATIVE ng/mL
Methadone Screen, Urine: NEGATIVE ng/mL
OXYCODONE+OXYMORPHONE UR QL SCN: POSITIVE — AB
Opiate Scrn, Ur: NEGATIVE ng/mL
Ph of Urine: 6.3 (ref 4.5–8.9)
Phencyclidine Qn, Ur: NEGATIVE ng/mL
Propoxyphene Scrn, Ur: NEGATIVE ng/mL
SPECIFIC GRAVITY: 1.016
Tramadol Screen, Urine: NEGATIVE ng/mL

## 2024-05-06 LAB — SPECIMEN STATUS REPORT

## 2024-05-09 ENCOUNTER — Encounter: Payer: Self-pay | Admitting: Physician Assistant

## 2024-05-09 DIAGNOSIS — E211 Secondary hyperparathyroidism, not elsewhere classified: Secondary | ICD-10-CM | POA: Insufficient documentation

## 2024-05-09 NOTE — Progress Notes (Signed)
 Kimberly Wade,   Vitamin d  still low. Are you taking the 50,000 high dose with dairy?  Potassium still low. Are you taking potassium medication daily.   Calcium  low, ordered PTH and showed elevated as well. 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 Start calcium  supplement 600mg  twice a day. Recheck in 3 months  Albumin low, need to eat more protein.

## 2024-05-11 ENCOUNTER — Other Ambulatory Visit: Payer: Self-pay | Admitting: Physician Assistant

## 2024-05-11 DIAGNOSIS — Z1231 Encounter for screening mammogram for malignant neoplasm of breast: Secondary | ICD-10-CM

## 2024-05-12 ENCOUNTER — Encounter

## 2024-05-12 DIAGNOSIS — Z1231 Encounter for screening mammogram for malignant neoplasm of breast: Secondary | ICD-10-CM

## 2024-05-16 MED ORDER — VITAMIN D (ERGOCALCIFEROL) 1.25 MG (50000 UNIT) PO CAPS: 50000.0000 [IU] | ORAL_CAPSULE | ORAL | 1 refills | Status: AC

## 2024-05-18 ENCOUNTER — Encounter: Payer: Self-pay | Admitting: Pulmonary Disease

## 2024-05-18 ENCOUNTER — Ambulatory Visit: Admitting: Pulmonary Disease

## 2024-05-23 ENCOUNTER — Encounter: Payer: Self-pay | Admitting: Physician Assistant

## 2024-05-23 ENCOUNTER — Ambulatory Visit (INDEPENDENT_AMBULATORY_CARE_PROVIDER_SITE_OTHER): Admitting: Physician Assistant

## 2024-05-23 VITALS — BP 132/80 | HR 80 | Ht 65.0 in | Wt 143.0 lb

## 2024-05-23 DIAGNOSIS — S42292D Other displaced fracture of upper end of left humerus, subsequent encounter for fracture with routine healing: Secondary | ICD-10-CM

## 2024-05-23 DIAGNOSIS — D508 Other iron deficiency anemias: Secondary | ICD-10-CM | POA: Diagnosis not present

## 2024-05-23 DIAGNOSIS — Z9889 Other specified postprocedural states: Secondary | ICD-10-CM

## 2024-05-23 DIAGNOSIS — Z09 Encounter for follow-up examination after completed treatment for conditions other than malignant neoplasm: Secondary | ICD-10-CM

## 2024-05-23 DIAGNOSIS — E559 Vitamin D deficiency, unspecified: Secondary | ICD-10-CM

## 2024-05-23 DIAGNOSIS — S42293D Other displaced fracture of upper end of unspecified humerus, subsequent encounter for fracture with routine healing: Secondary | ICD-10-CM | POA: Insufficient documentation

## 2024-05-23 NOTE — Progress Notes (Signed)
 Established Patient Office Visit  Subjective   Patient ID: Kimberly Wade, female    DOB: 1971/03/01  Age: 53 y.o. MRN: [id number]  Chief Complaint  Patient presents with   Medical Management of Chronic Issues    HPI .Discussed the use of AI scribe software for clinical note transcription with the patient, who gave verbal consent to proceed.  History of Present Illness Kimberly Wade is a 53 year old female who presents for hospital follow-up after a fall resulting in a left arm fracture.  Left proximal humerus fracture and postoperative status - Sustained a left proximal humerus fracture on May 13, 2024, following a fall while standing in a doorway - Underwent surgical internal fixation during hospitalization - Discharged on May 18, 2024 - Currently non-weight bearing on the left arm - Able to perform some hand and arm movements, including massaging fluid down the arm as instructed by occupational therapy - Removes sling when sitting - Has not yet scheduled orthopedic follow-up with Dr. Germaine at [facility] - pt on chronic pain medication contract with oxycodone   Edema and swelling management - Manages swelling in the left arm by massaging fluid down the arm - Occupational therapist recommended this technique  Impaired mobility and activities of daily living - Requires assistance to get up after fall - Unable to shower fully due to surgical bandages; performing sponge baths instead  Hematologic abnormalities - Hemoglobin was low at 8 g/dL  - Hemoglobin improved to 8.4 g/dL at discharge - Received blood transfusion during hospitalization - Currently taking iron supplements, though not as regularly as prescribed  Electrolyte abnormalities - Calcium  level was low at 7.6 mg/dL during hospital stay - not taking calcium  supplementation - Taking vitamin D  once a week  Recent hand laceration - Sustained a cut on the hand from a mandolin slicer prior  to the fall - Initial healing followed by significant bleeding - Possible contribution to fall due to anxiety and adrenaline    ROS See HPI.    Objective:     BP 132/80   Pulse 80   Ht 5' 5 (1.651 m)   Wt 143 lb (64.9 kg)   SpO2 99%   BMI 23.80 kg/m  BP Readings from Last 3 Encounters:  05/23/24 132/80  04/22/24 (!) 145/75  02/26/24 118/79   Wt Readings from Last 3 Encounters:  05/23/24 143 lb (64.9 kg)  04/22/24 135 lb (61.2 kg)  02/26/24 150 lb (68 kg)      Physical Exam Constitutional:      Appearance: Normal appearance.  HENT:     Head: Normocephalic.  Cardiovascular:     Rate and Rhythm: Normal rate.  Pulmonary:     Effort: Pulmonary effort is normal.  Musculoskeletal:     Comments: Left arm in sling with covered bandages over incisions. No signs of infection. Significant bruising in left upper and lower arm with swelling of left lower hand.   Neurological:     General: No focal deficit present.     Mental Status: She is alert and oriented to person, place, and time.  Psychiatric:        Mood and Affect: Mood normal.      The 10-year ASCVD risk score (Arnett DK, et al., 2019) is: 1.4%    Assessment & Plan:  Kimberly Wade was seen today for medical management of chronic issues.  Diagnoses and all orders for this visit:  Hospital discharge follow-up  Vitamin D  deficiency -  VITAMIN D  25 Hydroxy (Vit-D Deficiency, Fractures)  Hypocalcemia -     BMP8+eGFR -     VITAMIN D  25 Hydroxy (Vit-D Deficiency, Fractures)  S/P shoulder surgery  Iron deficiency anemia secondary to inadequate dietary iron intake -     CBC w/Diff/Platelet -     Fe+TIBC+Fer  Closed 3-part fracture of proximal end of left humerus with routine healing, subsequent encounter -     DG Bone Density; Future    Assessment & Plan Left proximal humerus fracture, post-surgical fixation Status post-surgical internal fixation. Non-weight bearing with arm immobility  except for hand movements and specific exercises. Swelling present, no infection signs. - Follow up with orthopedic surgeon Dr. Germaine at [facility]. - Use ice to reduce swelling. - Monitor for signs of infection. - continue with oxycodone  use from chronic pain contract  Anemia following recent trauma and surgery Anemia with hemoglobin at 8.4 g/dL at discharge. Likely related to trauma and surgery. Not consistently taking iron supplements. - Recheck hemoglobin level. - Advise to take iron supplements at least twice a day, increasing to three times a day if tolerated.  Hypocalcemia Calcium  level low at 7.6 mg/dL. Not taking calcium  supplements. Adequate calcium  necessary for bone healing post-surgery. - Advise to take 1300 mg of calcium  daily, preferably through supplements and diet. - Check vitamin D  level.  Risk for osteoporosis High risk for osteoporosis due to brittle bones observed during surgery. Last DEXA scan in 2010. - Order DEXA scan to assess bone density.  Follow-Up Requires follow-up for post-surgical care and anemia management. - Ensure follow-up appointment with orthopedic surgeon Dr. Germaine is scheduled. - Recheck hemoglobin level. - Order DEXA scan.     Return if symptoms worsen or fail to improve.    Gerlene Glassburn, PA-C

## 2024-05-23 NOTE — Patient Instructions (Addendum)
 Continue vitamin D  weekly.  Add calcium  600mg  twice a day and calcium  rich foods. Recheck hemoglobin and calcium  today.  Bone density ordered.  Make sure to keep surgery follow up appt.

## 2024-05-24 LAB — CBC WITH DIFFERENTIAL/PLATELET
Basophils Absolute: 0.1 x10E3/uL (ref 0.0–0.2)
Basos: 1 %
EOS (ABSOLUTE): 0.1 x10E3/uL (ref 0.0–0.4)
Eos: 2 %
Hematocrit: 24.8 % — ABNORMAL LOW (ref 34.0–46.6)
Hemoglobin: 7.6 g/dL — ABNORMAL LOW (ref 11.1–15.9)
Immature Grans (Abs): 0 x10E3/uL (ref 0.0–0.1)
Immature Granulocytes: 0 %
Lymphocytes Absolute: 1.8 x10E3/uL (ref 0.7–3.1)
Lymphs: 34 %
MCH: 25.9 pg — ABNORMAL LOW (ref 26.6–33.0)
MCHC: 30.6 g/dL — ABNORMAL LOW (ref 31.5–35.7)
MCV: 84 fL (ref 79–97)
Monocytes Absolute: 0.6 x10E3/uL (ref 0.1–0.9)
Monocytes: 11 %
Neutrophils Absolute: 2.8 x10E3/uL (ref 1.4–7.0)
Neutrophils: 52 %
Platelets: 339 x10E3/uL (ref 150–450)
RBC: 2.94 x10E6/uL — ABNORMAL LOW (ref 3.77–5.28)
RDW: 18.6 % — ABNORMAL HIGH (ref 11.7–15.4)
WBC: 5.4 x10E3/uL (ref 3.4–10.8)

## 2024-05-24 LAB — BMP8+EGFR
BUN/Creatinine Ratio: 18 (ref 9–23)
BUN: 9 mg/dL (ref 6–24)
CO2: 20 mmol/L (ref 20–29)
Calcium: 7.9 mg/dL — ABNORMAL LOW (ref 8.7–10.2)
Chloride: 109 mmol/L — ABNORMAL HIGH (ref 96–106)
Creatinine, Ser: 0.5 mg/dL — ABNORMAL LOW (ref 0.57–1.00)
Glucose: 112 mg/dL — ABNORMAL HIGH (ref 70–99)
Potassium: 3.9 mmol/L (ref 3.5–5.2)
Sodium: 141 mmol/L (ref 134–144)
eGFR: 112 mL/min/1.73 (ref >59)

## 2024-05-24 LAB — IRON,TIBC AND FERRITIN PANEL
Ferritin: 81 ng/mL (ref 15–150)
Iron Saturation: 9 % — CL (ref 15–55)
Iron: 26 ug/dL — ABNORMAL LOW (ref 27–159)
Total Iron Binding Capacity: 292 ug/dL (ref 250–450)
UIBC: 266 ug/dL (ref 131–425)

## 2024-05-24 LAB — VITAMIN D 25 HYDROXY (VIT D DEFICIENCY, FRACTURES): Vit D, 25-Hydroxy: 20.6 ng/mL — ABNORMAL LOW (ref 30.0–100.0)

## 2024-05-25 ENCOUNTER — Ambulatory Visit: Payer: Self-pay | Admitting: Physician Assistant

## 2024-05-25 ENCOUNTER — Other Ambulatory Visit

## 2024-05-25 DIAGNOSIS — D649 Anemia, unspecified: Secondary | ICD-10-CM

## 2024-05-25 DIAGNOSIS — R790 Abnormal level of blood mineral: Secondary | ICD-10-CM

## 2024-05-25 NOTE — Progress Notes (Signed)
 You look like you need iron infusion. Hemoglobin dropping.   Vitamin D  low. How much are you taking?  How much calcium  are you taking?   Referral made to hematology.

## 2024-07-25 ENCOUNTER — Ambulatory Visit: Admitting: Physician Assistant

## 2024-07-27 ENCOUNTER — Other Ambulatory Visit

## 2024-07-28 ENCOUNTER — Encounter: Payer: Self-pay | Admitting: Physician Assistant

## 2024-07-28 ENCOUNTER — Other Ambulatory Visit: Payer: Self-pay | Admitting: Physician Assistant

## 2024-07-28 DIAGNOSIS — G894 Chronic pain syndrome: Secondary | ICD-10-CM

## 2024-07-28 DIAGNOSIS — F988 Other specified behavioral and emotional disorders with onset usually occurring in childhood and adolescence: Secondary | ICD-10-CM

## 2024-07-29 ENCOUNTER — Telehealth: Payer: Self-pay

## 2024-07-29 DIAGNOSIS — F988 Other specified behavioral and emotional disorders with onset usually occurring in childhood and adolescence: Secondary | ICD-10-CM

## 2024-07-29 NOTE — Telephone Encounter (Unsigned)
 Copied from CRM #[id number]. Topic: Clinical - Medication Question >> Jul 28, 2024  4:04 PM Kimberly Wade wrote: Reason for CRM: Pt scheduled an appt for med refills for the next available, which is 08/19/24. However, pt said she will be due for refills in about 1 week and wants to know if her prescriptions will be refilled.

## 2024-07-29 NOTE — Telephone Encounter (Signed)
 2nd request.  Routing to the covering provider.  ADDERALL Last OV: 05/23/24 Next OV: 08/19/24 Last RF: 06/21/24  Oxycodone  Last OV: 05/23/24 Next OV: 08/19/24 Last RF: 05/22/24

## 2024-08-19 ENCOUNTER — Ambulatory Visit: Admitting: Physician Assistant

## 2024-08-25 ENCOUNTER — Other Ambulatory Visit: Payer: Self-pay | Admitting: Urgent Care

## 2024-08-25 DIAGNOSIS — F988 Other specified behavioral and emotional disorders with onset usually occurring in childhood and adolescence: Secondary | ICD-10-CM

## 2024-08-25 NOTE — Telephone Encounter (Signed)
 Requesting rx rf of Adderall XR 20mg   Last written 07/31/2024 Last OV 05/23/2024 hospital f/u  and 04/22/2024 ADHD f/u  Upcoming appt = none

## 2024-08-26 NOTE — Telephone Encounter (Signed)
.  PDMP reviewed during this encounter. Refill ok for 1/14.

## 2024-09-08 ENCOUNTER — Encounter: Payer: Self-pay | Admitting: Physician Assistant

## 2024-09-08 DIAGNOSIS — F988 Other specified behavioral and emotional disorders with onset usually occurring in childhood and adolescence: Secondary | ICD-10-CM

## 2024-09-08 DIAGNOSIS — G894 Chronic pain syndrome: Secondary | ICD-10-CM

## 2024-09-08 NOTE — Telephone Encounter (Signed)
 Patient requesting rx rf of  Adderal XR 20mg   Last written 08/31/2024 And  Adderal IR 20mg  Last written 07/31/2024 And  Oxycodone  IR 15mg   Last written 07/31/2024 Last OV 05/23/2024 Upcoming appt = none  Spoke with pharmacy at [facility] and was told that Adderall XR 20mg  was last picked up on 08/31/2024 and adderal 20 IR was picked up on 08/16/2024.

## 2024-09-09 MED ORDER — OXYCODONE HCL 15 MG PO TABS: 15.0000 mg | ORAL_TABLET | Freq: Three times a day (TID) | ORAL | 0 refills | Status: AC | PRN

## 2024-09-09 MED ORDER — AMPHETAMINE-DEXTROAMPHETAMINE 20 MG PO TABS: ORAL_TABLET | ORAL | 0 refills | Status: AC

## 2024-09-09 NOTE — Telephone Encounter (Signed)
 Sent!
